# Patient Record
Sex: Female | Born: 1975 | Race: White | Hispanic: No | Marital: Single | Smoking: Never smoker
Health system: Southern US, Community
[De-identification: ages and names within clinical notes are randomized; demographics above are authoritative.]

## PROBLEM LIST (undated history)

## (undated) DIAGNOSIS — G43909 Migraine, unspecified, not intractable, without status migrainosus: Secondary | ICD-10-CM

## (undated) DIAGNOSIS — M797 Fibromyalgia: Secondary | ICD-10-CM

## (undated) DIAGNOSIS — Z87442 Personal history of urinary calculi: Secondary | ICD-10-CM

## (undated) DIAGNOSIS — C801 Malignant (primary) neoplasm, unspecified: Secondary | ICD-10-CM

## (undated) DIAGNOSIS — F419 Anxiety disorder, unspecified: Secondary | ICD-10-CM

## (undated) DIAGNOSIS — E119 Type 2 diabetes mellitus without complications: Secondary | ICD-10-CM

## (undated) DIAGNOSIS — J45909 Unspecified asthma, uncomplicated: Secondary | ICD-10-CM

## (undated) DIAGNOSIS — R160 Hepatomegaly, not elsewhere classified: Secondary | ICD-10-CM

## (undated) DIAGNOSIS — I10 Essential (primary) hypertension: Secondary | ICD-10-CM

## (undated) DIAGNOSIS — E7800 Pure hypercholesterolemia, unspecified: Secondary | ICD-10-CM

## (undated) DIAGNOSIS — F32A Depression, unspecified: Secondary | ICD-10-CM

## (undated) HISTORY — PX: OTHER SURGICAL HISTORY: SHX169

## (undated) HISTORY — PX: SPLENECTOMY: SUR1306

## (undated) HISTORY — PX: LITHOTRIPSY: SUR834

## (undated) HISTORY — PX: NASAL POLYP SURGERY: SHX186

## (undated) HISTORY — PX: WRIST SURGERY: SHX841

## (undated) HISTORY — PX: LEFT OOPHORECTOMY: SHX1961

## (undated) HISTORY — PX: ABDOMINAL HYSTERECTOMY: SHX81

## (undated) HISTORY — PX: ANTERIOR AND POSTERIOR REPAIR: SHX1172

---

## 2020-11-05 ENCOUNTER — Other Ambulatory Visit: Payer: Self-pay

## 2020-11-05 ENCOUNTER — Emergency Department (HOSPITAL_COMMUNITY)
Admission: EM | Admit: 2020-11-05 | Discharge: 2020-11-05 | Disposition: A | Discharge status: Home / Self Care | Payer: Self-pay | Attending: Emergency Medicine | Admitting: Emergency Medicine

## 2020-11-05 ENCOUNTER — Encounter (HOSPITAL_COMMUNITY): Payer: Self-pay

## 2020-11-05 ENCOUNTER — Emergency Department (HOSPITAL_COMMUNITY): Payer: Self-pay

## 2020-11-05 DIAGNOSIS — Z859 Personal history of malignant neoplasm, unspecified: Secondary | ICD-10-CM | POA: Insufficient documentation

## 2020-11-05 DIAGNOSIS — R1011 Right upper quadrant pain: Secondary | ICD-10-CM | POA: Insufficient documentation

## 2020-11-05 DIAGNOSIS — I10 Essential (primary) hypertension: Secondary | ICD-10-CM | POA: Insufficient documentation

## 2020-11-05 DIAGNOSIS — Z79899 Other long term (current) drug therapy: Secondary | ICD-10-CM | POA: Insufficient documentation

## 2020-11-05 DIAGNOSIS — G43809 Other migraine, not intractable, without status migrainosus: Secondary | ICD-10-CM | POA: Insufficient documentation

## 2020-11-05 DIAGNOSIS — E119 Type 2 diabetes mellitus without complications: Secondary | ICD-10-CM | POA: Insufficient documentation

## 2020-11-05 HISTORY — DX: Type 2 diabetes mellitus without complications: E11.9

## 2020-11-05 HISTORY — DX: Depression, unspecified: F32.A

## 2020-11-05 HISTORY — DX: Migraine, unspecified, not intractable, without status migrainosus: G43.909

## 2020-11-05 HISTORY — DX: Pure hypercholesterolemia, unspecified: E78.00

## 2020-11-05 HISTORY — DX: Essential (primary) hypertension: I10

## 2020-11-05 HISTORY — DX: Anxiety disorder, unspecified: F41.9

## 2020-11-05 HISTORY — DX: Malignant (primary) neoplasm, unspecified: C80.1

## 2020-11-05 LAB — URINALYSIS, ROUTINE W REFLEX MICROSCOPIC
Bacteria, UA: NONE SEEN
Bilirubin Urine: NEGATIVE
Glucose, UA: 150 mg/dL — AB
Ketones, ur: 5 mg/dL — AB
Leukocytes,Ua: NEGATIVE
Nitrite: NEGATIVE
Protein, ur: 30 mg/dL — AB
RBC / HPF: >50 RBC/hpf — ABNORMAL HIGH (ref 0–5)
Specific Gravity, Urine: >1.046 — ABNORMAL HIGH (ref 1.005–1.030)
pH: 8 (ref 5.0–8.0)

## 2020-11-05 LAB — COMPREHENSIVE METABOLIC PANEL
ALT: 15 U/L (ref 0–44)
AST: 16 U/L (ref 15–41)
Albumin: 4 g/dL (ref 3.5–5.0)
Alkaline Phosphatase: 77 U/L (ref 38–126)
Anion gap: 9 (ref 5–15)
BUN: 11 mg/dL (ref 6–20)
CO2: 29 mmol/L (ref 22–32)
Calcium: 9.7 mg/dL (ref 8.9–10.3)
Chloride: 104 mmol/L (ref 98–111)
Creatinine, Ser: 0.66 mg/dL (ref 0.44–1.00)
GFR, Estimated: >60 mL/min (ref >60)
Glucose, Bld: 282 mg/dL — ABNORMAL HIGH (ref 70–99)
Potassium: 3.9 mmol/L (ref 3.5–5.1)
Sodium: 142 mmol/L (ref 135–145)
Total Bilirubin: 0.5 mg/dL (ref 0.3–1.2)
Total Protein: 8 g/dL (ref 6.5–8.1)

## 2020-11-05 LAB — CBC
HCT: 41.6 % (ref 36.0–46.0)
Hemoglobin: 13.9 g/dL (ref 12.0–15.0)
MCH: 31.3 pg (ref 26.0–34.0)
MCHC: 33.4 g/dL (ref 30.0–36.0)
MCV: 93.7 fL (ref 80.0–100.0)
Platelets: 549 10*3/uL — ABNORMAL HIGH (ref 150–400)
RBC: 4.44 MIL/uL (ref 3.87–5.11)
RDW: 14.6 % (ref 11.5–15.5)
WBC: 15.2 10*3/uL — ABNORMAL HIGH (ref 4.0–10.5)
nRBC: 0 % (ref 0.0–0.2)

## 2020-11-05 LAB — RAPID URINE DRUG SCREEN, HOSP PERFORMED
Amphetamines: NOT DETECTED
Barbiturates: NOT DETECTED
Benzodiazepines: NOT DETECTED
Cocaine: NOT DETECTED
Opiates: POSITIVE — AB
Tetrahydrocannabinol: POSITIVE — AB

## 2020-11-05 LAB — TYPE AND SCREEN
ABO/RH(D): AB POS
Antibody Screen: NEGATIVE

## 2020-11-05 LAB — POC OCCULT BLOOD, ED: Fecal Occult Bld: NEGATIVE

## 2020-11-05 LAB — CBG MONITORING, ED: Glucose-Capillary: 295 mg/dL — ABNORMAL HIGH (ref 70–99)

## 2020-11-05 LAB — LIPASE, BLOOD: Lipase: 24 U/L (ref 11–51)

## 2020-11-05 MED ORDER — ONDANSETRON HCL 4 MG/2ML IJ SOLN
4.0000 mg | Freq: Once | INTRAMUSCULAR | Status: AC
  Administered 2020-11-05: 4 mg via INTRAVENOUS
  Filled 2020-11-05: qty 2

## 2020-11-05 MED ORDER — SODIUM CHLORIDE 0.9 % IV SOLN
25.0000 mg | INTRAVENOUS | Status: DC | PRN
  Administered 2020-11-05: 25 mg via INTRAVENOUS
  Filled 2020-11-05: qty 25

## 2020-11-05 MED ORDER — HYDROMORPHONE HCL 1 MG/ML IJ SOLN
1.0000 mg | Freq: Once | INTRAMUSCULAR | Status: AC
  Administered 2020-11-05: 1 mg via INTRAVENOUS
  Filled 2020-11-05: qty 1

## 2020-11-05 MED ORDER — DIPHENHYDRAMINE HCL 50 MG/ML IJ SOLN
25.0000 mg | Freq: Once | INTRAMUSCULAR | Status: AC
  Administered 2020-11-05: 25 mg via INTRAVENOUS
  Filled 2020-11-05: qty 1

## 2020-11-05 MED ORDER — KETOROLAC TROMETHAMINE 15 MG/ML IJ SOLN
15.0000 mg | Freq: Once | INTRAMUSCULAR | Status: AC
  Administered 2020-11-05: 15 mg via INTRAVENOUS
  Filled 2020-11-05: qty 1

## 2020-11-05 MED ORDER — SODIUM CHLORIDE 0.9 % IV BOLUS
1000.0000 mL | Freq: Once | INTRAVENOUS | Status: AC
  Administered 2020-11-05: 1000 mL via INTRAVENOUS

## 2020-11-05 MED ORDER — MORPHINE SULFATE (PF) 4 MG/ML IV SOLN
4.0000 mg | Freq: Once | INTRAVENOUS | Status: AC
Start: 2020-11-05 — End: 2020-11-05
  Administered 2020-11-05: 4 mg via INTRAVENOUS
  Filled 2020-11-05: qty 1

## 2020-11-05 MED ORDER — IOHEXOL 350 MG/ML SOLN
80.0000 mL | Freq: Once | INTRAVENOUS | Status: AC | PRN
  Administered 2020-11-05: 80 mL via INTRAVENOUS

## 2020-11-05 MED ORDER — PROCHLORPERAZINE EDISYLATE 10 MG/2ML IJ SOLN
10.0000 mg | Freq: Once | INTRAMUSCULAR | Status: AC
  Administered 2020-11-05: 10 mg via INTRAVENOUS
  Filled 2020-11-05: qty 2

## 2020-11-05 NOTE — ED Notes (Addendum)
Pt reports that she feels dizzy and pain is 9/10 in her head. MD notified.

## 2020-11-05 NOTE — ED Triage Notes (Signed)
Patient c/o migraine and vomiting x 3 days. Patient states she has taken prescribed migraine meds with no relief.  Patient c/o RUQ pain x 3 days. Patient also reports that she saw dark blood in her stool x 3 weeks. Patient reports a colon cancer history

## 2020-11-05 NOTE — Discharge Instructions (Addendum)
Your laboratory results on today's visit were within normal limits.  We discussed the results of your CT abdomen along with your ultrasound findings.  Will need to follow-up with your primary care physician as needed.  If you experience any worsening symptoms, nausea or vomiting you will need to return to the emergency department.

## 2020-11-05 NOTE — ED Provider Notes (Signed)
Emergency Medicine Provider Triage Evaluation Note  Jamie Conley , a 45 y.o. female  was evaluated in triage.  With multiple complaints. Complaining of migraine headaches for the past 3 days. She also complains of RUQ abdominal pain for the past week and BRBPR. Has been taking Topamax for her headache without relief. Feels like typical migraine but worse because she hasn't gotten it to go away. + blurry vision, nausea, vomiting, photophobia, photophobia.   Hx of colon cancer in her 1's - polyps removed and 6 months of chemo. NO colon resection. Still has gallbladder.   Review of Systems  Positive: + HA, nausea, vomiting, abdominal pain, BRBPR Negative: - weakness, numbness, diarrhea  Physical Exam  BP (!) 179/123 (BP Location: Left Arm)   Pulse (!) 102   Temp 98 F (36.7 C) (Oral)   Resp 20   Ht 5\' 3"  (1.6 m)   Wt 96.2 kg   SpO2 98%   BMI 37.55 kg/m  Gen:   Awake, no distress   Resp:  Normal effort  MSK:   Moves extremities without difficulty  Other:  + RUQ TTP with positive Murphy's. Neuro intact.   Medical Decision Making  Medically screening exam initiated at 3:24 PM.  Appropriate orders placed.  Jamie Conley was informed that the remainder of the evaluation will be completed by another provider, this initial triage assessment does not replace that evaluation, and the importance of remaining in the ED until their evaluation is complete.     Jamie Maize, PA-C 11/05/20 1529    Jamie Leigh, MD 11/08/20 [phone]

## 2020-11-05 NOTE — ED Provider Notes (Signed)
[name]   CSN: [id number] Arrival date & time: 11/05/20  1442     History Chief Complaint  Patient presents with   Migraine   Emesis   Hyperglycemia    Jamie Conley is a 45 y.o. female.  45 y.o female with a PMH of Anxiety, Cancer, DM presents to the ED with multiple complaints.  Patient reports she is had a migraine for the past 3 days, does have a history of these, describing as a sharp sensation throughout the whole top of her head.  Exacerbated with sound along with lights.  She is currently on Topamax for prophylactic treatment, has been taking this without any improvement in her symptoms.  The second complaint includes abdominal pain, does describe a severe constant sharp pain to the right upper quadrant that is been ongoing, has tried taking some ibuprofen, Lortab without any improvement.  Does report a prior history of colon cancer at the age of 51 where she had polyps removed along with received 6 months of chemotherapy.  In addition, she is complaining of blood in her stool, has seen multiple small clots of bright red blood on the tissue, along with the toilet bowl.  She is concerned as she has not been able to eat anything in the past 2 days, has had multiple episodes of nonbloody emesis.  Patient does report feeling weaker and dizzy, states that she tried to ambulate from the couch to her bed and felt "I could barely make it to".  Denies to endorse some nausea.  She does have a history of diabetes, currently on 8000 mg regimen twice daily, this was adjusted about 2 weeks ago.  Denies any chest pain, shortness of breath, hematemesis.    The history is provided by the patient and medical records.  Migraine This is a recurrent problem. The current episode started more than 2 days ago. The problem occurs constantly. The problem has been gradually worsening. Associated symptoms include abdominal pain and headaches.  Pertinent negatives include no chest pain and no shortness of breath.  Emesis Associated symptoms: abdominal pain, chills and headaches   Associated symptoms: no fever and no sore throat   Hyperglycemia Associated symptoms: abdominal pain and vomiting   Associated symptoms: no chest pain, no fever, no nausea and no shortness of breath       Past Medical History:  Diagnosis Date   Anxiety    Cancer ([place])    Depression    Diabetes mellitus without complication ([place])    High cholesterol    Hypertension    Migraine     There are no problems to display for this patient.   Past Surgical History:  Procedure Laterality Date   ABDOMINAL HYSTERECTOMY     colon polyectomy     NASAL POLYP SURGERY     SPLENECTOMY     WRIST SURGERY Left      OB History   No obstetric history on file.     Family History  Problem Relation Age of Onset   Hypertension Mother    High Cholesterol Mother    Heart attack Father     Social History   Tobacco Use   Smoking status: Never   Smokeless tobacco: Never  Vaping Use   Vaping Use: Never used  Substance Use Topics   Alcohol use: Never   Drug use: Yes    Comment: Delta 8 gummies    Home Medications Prior to Admission medications  Not on File    Allergies    Patient has no known allergies.  Review of Systems   Review of Systems  Constitutional:  Positive for chills. Negative for fever.  HENT:  Negative for sore throat.   Respiratory:  Negative for shortness of breath.   Cardiovascular:  Negative for chest pain.  Gastrointestinal:  Positive for abdominal pain, blood in stool and vomiting. Negative for nausea.  Genitourinary:  Negative for flank pain.  Neurological:  Positive for headaches.  All other systems reviewed and are negative.  Physical Exam Updated Vital Signs BP (!) 130/96   Pulse 81   Temp 98 F (36.7 C) (Oral)   Resp 15   Ht 5\' 3"  (1.6 m)   Wt 96.2 kg   SpO2 96%   BMI 37.55 kg/m   Physical Exam Vitals  and nursing note reviewed.  Constitutional:      Appearance: Normal appearance. She is ill-appearing.  HENT:     Head: Normocephalic and atraumatic.     Mouth/Throat:     Mouth: Mucous membranes are moist.  Eyes:     Pupils: Pupils are equal, round, and reactive to light.  Cardiovascular:     Rate and Rhythm: Normal rate.     Comments: No bilateral pitting edema, no calf tenderness. Pulmonary:     Effort: Pulmonary effort is normal.     Breath sounds: No wheezing.     Comments: Lungs are clear to auscultation without any wheezing, rhonchi, or rales. Abdominal:     General: Abdomen is flat. Bowel sounds are decreased.     Palpations: Abdomen is soft.     Tenderness: There is abdominal tenderness. There is guarding. There is no right CVA tenderness or left CVA tenderness.     Comments: Significant pain with palpation along the right upper and lower quadrant.  Positive Murphy sign.  Bowel sounds are decreased.  Musculoskeletal:     Cervical back: Normal range of motion and neck supple.  Skin:    General: Skin is warm and dry.  Neurological:     Mental Status: She is alert and oriented to person, place, and time.     Comments: No facial asymmetry, pupils are equal and reactive, no dysarthria.  Moves all upper and lower extremities.    ED Results / Procedures / Treatments   Labs (all labs ordered are listed, but only abnormal results are displayed) Labs Reviewed  COMPREHENSIVE METABOLIC PANEL - Abnormal; Notable for the following components:      Result Value   Glucose, Bld 282 (*)    All other components within normal limits  CBC - Abnormal; Notable for the following components:   WBC 15.2 (*)    Platelets 549 (*)    All other components within normal limits  URINALYSIS, ROUTINE W REFLEX MICROSCOPIC - Abnormal; Notable for the following components:   Color, Urine RED (*)    APPearance HAZY (*)    Specific Gravity, Urine >1.046 (*)    Glucose, UA 150 (*)    Hgb urine  dipstick LARGE (*)    Ketones, ur 5 (*)    Protein, ur 30 (*)    RBC / HPF >50 (*)    All other components within normal limits  RAPID URINE DRUG SCREEN, HOSP PERFORMED - Abnormal; Notable for the following components:   Opiates POSITIVE (*)    Tetrahydrocannabinol POSITIVE (*)    All other components within normal limits  CBG MONITORING, ED - Abnormal; Notable  for the following components:   Glucose-Capillary 295 (*)    All other components within normal limits  LIPASE, BLOOD  POC OCCULT BLOOD, ED  TYPE AND SCREEN  ABO/RH    EKG None  Radiology CT ABDOMEN PELVIS W CONTRAST  Result Date: 11/05/2020 CLINICAL DATA:  Right upper quadrant pain for 3 days. Blood in stool 3 weeks ago. History of colon cancer. EXAM: CT ABDOMEN AND PELVIS WITH CONTRAST TECHNIQUE: Multidetector CT imaging of the abdomen and pelvis was performed using the standard protocol following bolus administration of intravenous contrast. CONTRAST:  60mL OMNIPAQUE IOHEXOL 350 MG/ML SOLN COMPARISON:  Abdominal ultrasound of earlier today FINDINGS: Lower chest: Right lower lobe pleural-based nodule or area of pleural thickening measuring 6 mm on 19/6. Normal heart size without pericardial or pleural effusion. Right coronary artery calcification. Hepatobiliary: Moderate hepatic steatosis and hepatomegaly. Caudate and lateral segment left liver lobe enlargement. Mildly irregular hepatic capsule. Normal gallbladder. No biliary duct dilatation. Pancreas: Normal, without mass or ductal dilatation. Spleen: Splenectomy. Adrenals/Urinary Tract: Normal adrenal glands. Lower pole left renal collecting system calculi of up to 5 mm including on 41/2. No hydronephrosis. Normal urinary bladder. Stomach/Bowel: Normal stomach, without wall thickening. Normal colon and terminal ileum. Retrocecal appendix terminates in the right upper quadrant, but is otherwise normal. Normal small bowel. Vascular/Lymphatic: Aortic atherosclerosis. No portal venous  hypertension. No abdominal or pelvic sidewall adenopathy. Multiple bilateral inguinal nodes are likely reactive and can be seen in the setting of obesity. Reproductive: Hysterectomy.  No adnexal mass. Other: No significant free fluid. No free intraperitoneal air. Ventral abdominal wall laxity contains fat, including on 51/2. Musculoskeletal: No acute osseous abnormality. IMPRESSION: 1.  No acute process or explanation for right upper quadrant pain. 2. Hepatomegaly and hepatic steatosis.  Suspicion of mild cirrhosis. 3. Markedly age advanced coronary artery atherosclerosis. Correlate with risk factors and consider medical therapy. 4. Pleural-based right lower lobe nodule or area of pleural thickening. Given the clinical history of colon cancer, consider chest CT follow-up at 6 months. Unlikely to represent metastatic disease given location and morphology. 5. Left nephrolithiasis Electronically Signed   By: Abigail Miyamoto M.D.   On: 11/05/2020 19:00   US Abdomen Limited RUQ (LIVER/GB)  Result Date: 11/05/2020 CLINICAL DATA:  Right upper quadrant pain. EXAM: ULTRASOUND ABDOMEN LIMITED RIGHT UPPER QUADRANT COMPARISON:  None. FINDINGS: Gallbladder: No gallstones or wall thickening visualized. No sonographic Murphy sign noted by sonographer. Common bile duct: Diameter: 7.6 mm. Liver: No focal lesion identified. Parenchymal echogenicity has diffusely increased. Portal vein is patent on color Doppler imaging with normal direction of blood flow towards the liver. IMPRESSION: The gallbladder appears normal. Common bile duct borderline dilated. Recommend correlation with lab values to exclude distal bile duct obstruction. Electronically Signed   By: Ronney Asters M.D.   On: 11/05/2020 16:41    Procedures Procedures   Medications Ordered in ED Medications  diphenhydrAMINE (BENADRYL) 25 mg in sodium chloride 0.9 % 50 mL IVPB (0 mg Intravenous Stopped 11/05/20 1811)  ondansetron (ZOFRAN) injection 4 mg (4 mg Intravenous  Given 11/05/20 1704)  morphine 4 MG/ML injection 4 mg (4 mg Intravenous Given 11/05/20 1704)  sodium chloride 0.9 % bolus 1,000 mL (0 mLs Intravenous Stopped 11/05/20 1742)  iohexol (OMNIPAQUE) 350 MG/ML injection 80 mL (80 mLs Intravenous Contrast Given 11/05/20 1806)  HYDROmorphone (DILAUDID) injection 1 mg (1 mg Intravenous Given 11/05/20 1826)  diphenhydrAMINE (BENADRYL) injection 25 mg (25 mg Intravenous Given 11/05/20 2015)  prochlorperazine (COMPAZINE) injection 10 mg (  10 mg Intravenous Given 11/05/20 2016)  ketorolac (TORADOL) 15 MG/ML injection 15 mg (15 mg Intravenous Given 11/05/20 2014)    ED Course  I have reviewed the triage vital signs and the nursing notes.  Pertinent labs & imaging results that were available during my care of the patient were reviewed by me and considered in my medical decision making (see chart for details).  Clinical Course as of 11/05/20 2134  Thu Nov 05, 2020  1625 Glucose-Capillary(!): 295 [JS]  1923 Fecal Occult Blood, POC: NEGATIVE [JS]  2048 Tetrahydrocannabinol(!): POSITIVE [JS]  2048 Hgb urine dipstick(!): LARGE [JS]  2051 WBC(!): 15.2 [JS]    Clinical Course User Index [JS] Janeece Fitting, PA-C   MDM Rules/Calculators/A&P   Patient presents to the ED with multiple complaints, first complaint being a migraine, prior history of this so prophylactic medication without improvement.  The second complaint is right upper quadrant pain, increasing weakness along with dizziness has been ongoing for the last couple days.  Does have a prior history of colon cancer, approximately at the age of 42, is concerned as there was multiple clots of blood in her stool, along with the toilet bowl.  Does report her blood sugars have been running slightly elevated, had her regimen recently changed approximately 2 weeks ago.  She is currently on 1000 mg of metformin twice daily.  Vitals are remarkable on arrival for hypertension, does report taking medication for her blood  pressure, however she feels that this is likely due to her pain on exam positive Murphy sign, does have some tenderness along McBurney's point.  Lungs are clear to auscultation, she is neurologically intact with equal and reactive pupils.  No facial asymmetry or dysarthria.  Does have a history of migraine, provided with headache cocktail.  Given Zofran, morphine, bolus to help with symptomatic treatment.  Ultrasound was ordered in the waiting room.  This revealed: The gallbladder appears normal. Common bile duct borderline dilated.  Recommend correlation with lab values to exclude distal bile duct  obstruction.      CT abdomen on today's visit showed: 1.  No acute process or explanation for right upper quadrant pain.  2. Hepatomegaly and hepatic steatosis.  Suspicion of mild cirrhosis.  3. Markedly age advanced coronary artery atherosclerosis. Correlate  with risk factors and consider medical therapy.  4. Pleural-based right lower lobe nodule or area of pleural  thickening. Given the clinical history of colon cancer, consider  chest CT follow-up at 6 months. Unlikely to represent metastatic  disease given location and morphology.  5. Left nephrolithiasis          She has received multiple rounds of medication while in the ED, including morphine, Dilaudid, headache cocktail for symptomatic treatment.  Patient's vitals have remained stable.  I did discuss results of CT findings with her, continues to voice pain.  However she does not have any nausea or active vomiting at the time.  UA with no signs of infection, UDS positive for opiates along with THC.  9:32 PM patient reassessed by me, with improvement in her symptoms.  We discussed following up with appropriate PCP, I am unable to see patient's records from prior visits as she reports she moved here from [place], however is currently residing in a [place] address. No records located on [facility].   PDMP was reviewed with some  narcotics filled in the state of [place], requested pain medication repeatedly however workup has been negative thus far. Patient understands and agrees  with management.      Portions of this note were generated with [employer]. Dictation errors may occur despite best attempts at proofreading.  Final Clinical Impression(s) / ED Diagnoses Final diagnoses:  RUQ abdominal pain  Other migraine without status migrainosus, not intractable    Rx / DC Orders ED Discharge Orders     None        Janeece Fitting, Hershal Coria 11/05/20 2134    Drenda Freeze, MD 11/05/20 2239

## 2020-12-23 ENCOUNTER — Encounter (HOSPITAL_BASED_OUTPATIENT_CLINIC_OR_DEPARTMENT_OTHER): Payer: Self-pay | Admitting: Emergency Medicine

## 2020-12-23 ENCOUNTER — Other Ambulatory Visit: Payer: Self-pay

## 2020-12-23 ENCOUNTER — Emergency Department (HOSPITAL_BASED_OUTPATIENT_CLINIC_OR_DEPARTMENT_OTHER): Payer: Self-pay

## 2020-12-23 ENCOUNTER — Emergency Department (HOSPITAL_BASED_OUTPATIENT_CLINIC_OR_DEPARTMENT_OTHER)
Admission: EM | Admit: 2020-12-23 | Discharge: 2020-12-24 | Disposition: A | Discharge status: Home / Self Care | Payer: Self-pay | Attending: Emergency Medicine | Admitting: Emergency Medicine

## 2020-12-23 DIAGNOSIS — Z859 Personal history of malignant neoplasm, unspecified: Secondary | ICD-10-CM | POA: Insufficient documentation

## 2020-12-23 DIAGNOSIS — R350 Frequency of micturition: Secondary | ICD-10-CM | POA: Insufficient documentation

## 2020-12-23 DIAGNOSIS — R112 Nausea with vomiting, unspecified: Secondary | ICD-10-CM | POA: Insufficient documentation

## 2020-12-23 DIAGNOSIS — R197 Diarrhea, unspecified: Secondary | ICD-10-CM | POA: Insufficient documentation

## 2020-12-23 DIAGNOSIS — E119 Type 2 diabetes mellitus without complications: Secondary | ICD-10-CM | POA: Insufficient documentation

## 2020-12-23 DIAGNOSIS — R319 Hematuria, unspecified: Secondary | ICD-10-CM | POA: Insufficient documentation

## 2020-12-23 DIAGNOSIS — R300 Dysuria: Secondary | ICD-10-CM | POA: Insufficient documentation

## 2020-12-23 DIAGNOSIS — R519 Headache, unspecified: Secondary | ICD-10-CM

## 2020-12-23 DIAGNOSIS — F419 Anxiety disorder, unspecified: Secondary | ICD-10-CM | POA: Insufficient documentation

## 2020-12-23 DIAGNOSIS — G43909 Migraine, unspecified, not intractable, without status migrainosus: Secondary | ICD-10-CM | POA: Insufficient documentation

## 2020-12-23 DIAGNOSIS — I10 Essential (primary) hypertension: Secondary | ICD-10-CM | POA: Insufficient documentation

## 2020-12-23 LAB — CBC WITH DIFFERENTIAL/PLATELET
Abs Immature Granulocytes: 0.05 10*3/uL (ref 0.00–0.07)
Basophils Absolute: 0.1 10*3/uL (ref 0.0–0.1)
Basophils Relative: 0 %
Eosinophils Absolute: 0.1 10*3/uL (ref 0.0–0.5)
Eosinophils Relative: 1 %
HCT: 41 % (ref 36.0–46.0)
Hemoglobin: 14.2 g/dL (ref 12.0–15.0)
Immature Granulocytes: 0 %
Lymphocytes Relative: 41 %
Lymphs Abs: 6.7 10*3/uL — ABNORMAL HIGH (ref 0.7–4.0)
MCH: 31.1 pg (ref 26.0–34.0)
MCHC: 34.6 g/dL (ref 30.0–36.0)
MCV: 89.9 fL (ref 80.0–100.0)
Monocytes Absolute: 0.9 10*3/uL (ref 0.1–1.0)
Monocytes Relative: 6 %
Neutro Abs: 8.3 10*3/uL — ABNORMAL HIGH (ref 1.7–7.7)
Neutrophils Relative %: 52 %
Platelets: 547 10*3/uL — ABNORMAL HIGH (ref 150–400)
RBC: 4.56 MIL/uL (ref 3.87–5.11)
RDW: 14 % (ref 11.5–15.5)
WBC: 16.2 10*3/uL — ABNORMAL HIGH (ref 4.0–10.5)
nRBC: 0 % (ref 0.0–0.2)

## 2020-12-23 LAB — URINALYSIS, ROUTINE W REFLEX MICROSCOPIC: Specific Gravity, Urine: <1.005 — ABNORMAL LOW (ref 1.005–1.030)

## 2020-12-23 LAB — BASIC METABOLIC PANEL
Anion gap: 13 (ref 5–15)
BUN: 16 mg/dL (ref 6–20)
CO2: 19 mmol/L — ABNORMAL LOW (ref 22–32)
Calcium: 9.9 mg/dL (ref 8.9–10.3)
Chloride: 106 mmol/L (ref 98–111)
Creatinine, Ser: 0.75 mg/dL (ref 0.44–1.00)
GFR, Estimated: >60 mL/min (ref >60)
Glucose, Bld: 249 mg/dL — ABNORMAL HIGH (ref 70–99)
Potassium: 3.6 mmol/L (ref 3.5–5.1)
Sodium: 138 mmol/L (ref 135–145)

## 2020-12-23 LAB — WET PREP, GENITAL
Clue Cells Wet Prep HPF POC: NONE SEEN
Sperm: NONE SEEN
Trich, Wet Prep: NONE SEEN
WBC, Wet Prep HPF POC: <10 (ref <10)
Yeast Wet Prep HPF POC: NONE SEEN

## 2020-12-23 LAB — URINALYSIS, MICROSCOPIC (REFLEX): RBC / HPF: >50 RBC/hpf (ref 0–5)

## 2020-12-23 LAB — CBG MONITORING, ED: Glucose-Capillary: 238 mg/dL — ABNORMAL HIGH (ref 70–99)

## 2020-12-23 MED ORDER — DIPHENHYDRAMINE HCL 50 MG/ML IJ SOLN
25.0000 mg | Freq: Once | INTRAMUSCULAR | Status: AC
  Administered 2020-12-23: 25 mg via INTRAVENOUS
  Filled 2020-12-23: qty 1

## 2020-12-23 MED ORDER — KETOROLAC TROMETHAMINE 30 MG/ML IJ SOLN
30.0000 mg | Freq: Once | INTRAMUSCULAR | Status: AC
  Administered 2020-12-23: 30 mg via INTRAVENOUS
  Filled 2020-12-23: qty 1

## 2020-12-23 MED ORDER — SODIUM CHLORIDE 0.9 % IV BOLUS
1000.0000 mL | Freq: Once | INTRAVENOUS | Status: AC
Start: 2020-12-23 — End: 2020-12-23
  Administered 2020-12-23: 1000 mL via INTRAVENOUS

## 2020-12-23 MED ORDER — IOHEXOL 350 MG/ML SOLN
80.0000 mL | Freq: Once | INTRAVENOUS | Status: AC | PRN
  Administered 2020-12-23: 100 mL via INTRAVENOUS

## 2020-12-23 MED ORDER — PROCHLORPERAZINE EDISYLATE 10 MG/2ML IJ SOLN
10.0000 mg | Freq: Once | INTRAMUSCULAR | Status: AC
  Administered 2020-12-23: 10 mg via INTRAVENOUS
  Filled 2020-12-23: qty 2

## 2020-12-23 MED ORDER — INSULIN ASPART 100 UNIT/ML IJ SOLN
8.0000 [IU] | Freq: Once | INTRAMUSCULAR | Status: AC
  Administered 2020-12-23: 8 [IU] via SUBCUTANEOUS

## 2020-12-23 NOTE — Discharge Instructions (Signed)
CT scan results:  1. Hepatic steatosis.  2. Stable 6 mm pleural based right lower lobe lung nodule along the  posterolateral aspect of the right lower lobe. Non-contrast chest CT  at 6-12 months is recommended. If the nodule is stable at time of  repeat CT, then future CT at 18-24 months (from today's scan) is  considered optional for low-risk patients, but is recommended for  high-risk patients. This recommendation follows the consensus  statement: Guidelines for Management of Incidental Pulmonary Nodules  Detected on CT Images: From the Fleischner Society 2017; Radiology  2017; 284:228-243.

## 2020-12-23 NOTE — ED Provider Notes (Signed)
[facility] EMERGENCY DEPT Provider Note   CSN: [id number] Arrival date & time: 12/23/20  1855     History Chief Complaint  Patient presents with   Migraine    Melika Reder is a 45 y.o. female.  Mikiya Deauna Yaw is a 45 y.o. female with a history of hypertension, hyperlipidemia, diabetes, migraines, colon cancer, anxiety and depression, who presents to the emergency department with multiple complaints.  Patient reports she primarily came because she has been having a persistent and worsening migraine.  Started today and she has taken multiple doses of her migraine medication without resolution.  She reports with this she has had nausea and vomiting which is typical with her migraines.  She reports light sensitivity but denies any vision changes.  No numbness, tingling or weakness in her extremities.  Also reports that today she started to have severe pain across her mid back, she denies injury or trauma does report she has had some back pain previously and has an underlying history of degenerative disc disease.  No loss of bowel or bladder control or saddle anesthesia.  She also has been having dysuria, urinary frequency and hematuria for the past 2 to 3 days and is also concerned she may have a yeast infection.  Reports she has had to use Monistat previously.  Questions whether back pain could be related to a urinary tract infection.  Back pain is bilateral.  No fevers or chills.  She also reports that she had to leave her home in [place] and left without her diabetes medication and is concerned her blood sugar may be high now.  No associated fevers or chills.  Patient also endorses anxiety and that she is without her anxiety medications.  No other aggravating or alleviating factors.  The history is provided by the patient and medical records.  Migraine Associated symptoms include headaches. Pertinent negatives include no chest pain, no abdominal pain and  no shortness of breath.      Past Medical History:  Diagnosis Date   Anxiety    Cancer ([place])    Depression    Diabetes mellitus without complication ([place])    High cholesterol    Hypertension    Migraine     There are no problems to display for this patient.   Past Surgical History:  Procedure Laterality Date   ABDOMINAL HYSTERECTOMY     colon polyectomy     NASAL POLYP SURGERY     SPLENECTOMY     WRIST SURGERY Left      OB History   No obstetric history on file.     Family History  Problem Relation Age of Onset   Hypertension Mother    High Cholesterol Mother    Heart attack Father     Social History   Tobacco Use   Smoking status: Never   Smokeless tobacco: Never  Vaping Use   Vaping Use: Never used  Substance Use Topics   Alcohol use: Never   Drug use: Yes    Comment: Delta 8 gummies    Home Medications Prior to Admission medications   Not on File    Allergies    Patient has no known allergies.  Review of Systems   Review of Systems  Constitutional:  Negative for chills and fever.  HENT: Negative.    Eyes:  Positive for photophobia. Negative for visual disturbance.  Respiratory:  Negative for cough and shortness of breath.   Cardiovascular:  Negative for chest pain.  Gastrointestinal:  Positive for nausea and vomiting. Negative for abdominal pain, constipation and diarrhea.  Genitourinary:  Positive for dysuria, frequency and hematuria. Negative for vaginal bleeding and vaginal discharge.  Musculoskeletal:  Positive for back pain. Negative for myalgias, neck pain and neck stiffness.  Skin:  Negative for color change and rash.  Neurological:  Positive for headaches. Negative for dizziness, seizures, syncope, facial asymmetry, speech difficulty, weakness, light-headedness and numbness.  All other systems reviewed and are negative.  Physical Exam Updated Vital Signs BP (!) 134/97   Pulse 100   Temp 98.8 F (37.1 C) (Oral)   Resp 16   Ht  5\' 3"  (1.6 m)   Wt 93.9 kg   SpO2 98%   BMI 36.67 kg/m   Physical Exam Vitals and nursing note reviewed.  Constitutional:      General: She is not in acute distress.    Appearance: Normal appearance. She is well-developed. She is not ill-appearing or diaphoretic.     Comments: Patient appears anxious but is well-appearing and in no acute distress.  HENT:     Head: Normocephalic and atraumatic.     Mouth/Throat:     Mouth: Mucous membranes are moist.     Pharynx: Oropharynx is clear.  Eyes:     General:        Right eye: No discharge.        Left eye: No discharge.     Extraocular Movements: Extraocular movements intact.     Pupils: Pupils are equal, round, and reactive to light.  Cardiovascular:     Rate and Rhythm: Normal rate and regular rhythm.     Pulses: Normal pulses.     Heart sounds: Normal heart sounds. No murmur heard.   No friction rub. No gallop.  Pulmonary:     Effort: Pulmonary effort is normal. No respiratory distress.     Breath sounds: Normal breath sounds. No wheezing or rales.     Comments: Respirations equal and unlabored, patient able to speak in full sentences, lungs clear to auscultation bilaterally  Abdominal:     General: Bowel sounds are normal. There is no distension.     Palpations: Abdomen is soft. There is no mass.     Tenderness: There is no abdominal tenderness. There is no right CVA tenderness, left CVA tenderness or guarding.     Comments: Abdomen soft, nondistended, nontender to palpation in all quadrants without guarding or peritoneal signs  Musculoskeletal:        General: No deformity.     Cervical back: Neck supple.     Right lower leg: No edema.     Left lower leg: No edema.     Comments: Tenderness across the lower thoracic back without midline tenderness, step-off or deformity  Skin:    General: Skin is warm and dry.     Capillary Refill: Capillary refill takes less than 2 seconds.  Neurological:     Mental Status: She is alert  and oriented to person, place, and time.     Coordination: Coordination normal.     Comments: Speech is clear, able to follow commands CN III-XII intact Normal strength in upper and lower extremities bilaterally including dorsiflexion and plantar flexion, strong and equal grip strength Sensation normal to light and sharp touch Moves extremities without ataxia, coordination intact  Psychiatric:        Mood and Affect: Mood is anxious.        Behavior: Behavior normal.    ED  Results / Procedures / Treatments   Labs (all labs ordered are listed, but only abnormal results are displayed) Labs Reviewed  CBC WITH DIFFERENTIAL/PLATELET - Abnormal; Notable for the following components:      Result Value   WBC 16.2 (*)    Platelets 547 (*)    Neutro Abs 8.3 (*)    Lymphs Abs 6.7 (*)    All other components within normal limits  CBG MONITORING, ED - Abnormal; Notable for the following components:   Glucose-Capillary 238 (*)    All other components within normal limits  WET PREP, GENITAL  URINE CULTURE  URINALYSIS, ROUTINE W REFLEX MICROSCOPIC  BASIC METABOLIC PANEL    EKG None  Radiology No results found.  Procedures Procedures   Medications Ordered in ED Medications  sodium chloride 0.9 % bolus 1,000 mL (1,000 mLs Intravenous New Bag/Given 12/23/20 2134)  ketorolac (TORADOL) 30 MG/ML injection 30 mg (30 mg Intravenous Given 12/23/20 2136)  diphenhydrAMINE (BENADRYL) injection 25 mg (25 mg Intravenous Given 12/23/20 2135)  prochlorperazine (COMPAZINE) injection 10 mg (10 mg Intravenous Given 12/23/20 2136)    ED Course  I have reviewed the triage vital signs and the nursing notes.  Pertinent labs & imaging results that were available during my care of the patient were reviewed by me and considered in my medical decision making (see chart for details).    MDM Rules/Calculators/A&P                           45 year old female arrives with multiple complaints, reports  migraine not responding to her home medications as well as dysuria, and hematuria, thoracic back pain and nausea vomiting.  She has no focal neurologic deficits on exam, no nuchal rigidity to suggest meningitis, suspect persistent migraine, patient has been seen in the ED previously for similar headaches.  She has hematuria here without focal flank pain does have some pain across her back but no CVA tenderness on exam.  Concern for likely UTI, will check urinalysis and culture.  Patient also reports concern for possible yeast infection which she has had previously, will have patient self swab for wet prep.  She is not having any pelvic pain or reported discharge so we will defer pelvic exam at this time.  Patient also reports she has been without her diabetes medications and is concerned that her blood sugar may be high.  CBG of 238 on arrival, will check basic labs overall patient appears well, I have very low suspicion for DKA.  Patient will be given IV headache cocktail and IV fluids and will reevaluate headache and symptoms.  I have independently ordered, reviewed and interpreted all labs and imaging: CBG: 238 CBC: Leukocytosis of 16.2, normal hemoglobin, no abnormal vitals to suggest sepsis  BMP and urinalysis pending.  At shift change care signed out to Dr. Almyra Free who will follow up on urinalysis and BMP.  Suspect patient will be appropriate for discharge, will likely need p.o. antibiotics for urinary tract infection if her urinalysis is consistent with that.  Patient reports she has been without multiple of her anxiety and psychiatric medications, encourage patient to follow-up with the behavioral health urgent care to get these medications refilled.   Final Clinical Impression(s) / ED Diagnoses Final diagnoses:  Bad headache  Dysuria  Nausea and vomiting, unspecified vomiting type    Rx / DC Orders ED Discharge Orders     None  Janet Berlin 12/23/20 2212    Luna Fuse, MD 12/28/20 1622

## 2020-12-23 NOTE — ED Triage Notes (Signed)
Pt presents to ED POV. Pt c/o migraine. Pt reports she took home rx for migraine w/o relief. Pt also c/o back pain, stomach pain, n/v. Also mentions s/s of UTI and yeast infection.

## 2020-12-24 ENCOUNTER — Emergency Department (HOSPITAL_BASED_OUTPATIENT_CLINIC_OR_DEPARTMENT_OTHER): Payer: Self-pay

## 2020-12-24 ENCOUNTER — Encounter (HOSPITAL_BASED_OUTPATIENT_CLINIC_OR_DEPARTMENT_OTHER): Payer: Self-pay

## 2020-12-24 ENCOUNTER — Inpatient Hospital Stay (HOSPITAL_BASED_OUTPATIENT_CLINIC_OR_DEPARTMENT_OTHER)
Admission: EM | Admit: 2020-12-24 | Discharge: 2021-01-01 | DRG: 103 | Disposition: A | Discharge status: Home / Self Care | Payer: Self-pay | Attending: Internal Medicine | Admitting: Internal Medicine

## 2020-12-24 ENCOUNTER — Other Ambulatory Visit: Payer: Self-pay

## 2020-12-24 DIAGNOSIS — D72829 Elevated white blood cell count, unspecified: Secondary | ICD-10-CM | POA: Diagnosis present

## 2020-12-24 DIAGNOSIS — I10 Essential (primary) hypertension: Secondary | ICD-10-CM | POA: Diagnosis present

## 2020-12-24 DIAGNOSIS — Z8249 Family history of ischemic heart disease and other diseases of the circulatory system: Secondary | ICD-10-CM

## 2020-12-24 DIAGNOSIS — F32A Depression, unspecified: Secondary | ICD-10-CM | POA: Diagnosis present

## 2020-12-24 DIAGNOSIS — Z7984 Long term (current) use of oral hypoglycemic drugs: Secondary | ICD-10-CM

## 2020-12-24 DIAGNOSIS — E1165 Type 2 diabetes mellitus with hyperglycemia: Secondary | ICD-10-CM | POA: Diagnosis present

## 2020-12-24 DIAGNOSIS — Z9081 Acquired absence of spleen: Secondary | ICD-10-CM

## 2020-12-24 DIAGNOSIS — N136 Pyonephrosis: Secondary | ICD-10-CM | POA: Diagnosis present

## 2020-12-24 DIAGNOSIS — G43909 Migraine, unspecified, not intractable, without status migrainosus: Secondary | ICD-10-CM | POA: Diagnosis present

## 2020-12-24 DIAGNOSIS — R519 Headache, unspecified: Secondary | ICD-10-CM | POA: Diagnosis present

## 2020-12-24 DIAGNOSIS — Z20822 Contact with and (suspected) exposure to covid-19: Secondary | ICD-10-CM | POA: Diagnosis present

## 2020-12-24 DIAGNOSIS — K760 Fatty (change of) liver, not elsewhere classified: Secondary | ICD-10-CM | POA: Diagnosis present

## 2020-12-24 DIAGNOSIS — K921 Melena: Secondary | ICD-10-CM | POA: Diagnosis not present

## 2020-12-24 DIAGNOSIS — Z79899 Other long term (current) drug therapy: Secondary | ICD-10-CM

## 2020-12-24 DIAGNOSIS — G039 Meningitis, unspecified: Secondary | ICD-10-CM

## 2020-12-24 DIAGNOSIS — Z6837 Body mass index (BMI) 37.0-37.9, adult: Secondary | ICD-10-CM

## 2020-12-24 DIAGNOSIS — G43901 Migraine, unspecified, not intractable, with status migrainosus: Principal | ICD-10-CM

## 2020-12-24 DIAGNOSIS — R197 Diarrhea, unspecified: Secondary | ICD-10-CM | POA: Diagnosis present

## 2020-12-24 DIAGNOSIS — D75839 Thrombocytosis, unspecified: Secondary | ICD-10-CM | POA: Diagnosis present

## 2020-12-24 DIAGNOSIS — E876 Hypokalemia: Secondary | ICD-10-CM | POA: Diagnosis present

## 2020-12-24 DIAGNOSIS — F0781 Postconcussional syndrome: Secondary | ICD-10-CM

## 2020-12-24 DIAGNOSIS — F419 Anxiety disorder, unspecified: Secondary | ICD-10-CM | POA: Diagnosis present

## 2020-12-24 DIAGNOSIS — E872 Acidosis, unspecified: Secondary | ICD-10-CM | POA: Diagnosis present

## 2020-12-24 DIAGNOSIS — E669 Obesity, unspecified: Secondary | ICD-10-CM | POA: Diagnosis present

## 2020-12-24 DIAGNOSIS — E785 Hyperlipidemia, unspecified: Secondary | ICD-10-CM | POA: Diagnosis present

## 2020-12-24 DIAGNOSIS — R55 Syncope and collapse: Secondary | ICD-10-CM | POA: Diagnosis present

## 2020-12-24 DIAGNOSIS — I700 Atherosclerosis of aorta: Secondary | ICD-10-CM | POA: Diagnosis present

## 2020-12-24 DIAGNOSIS — E7800 Pure hypercholesterolemia, unspecified: Secondary | ICD-10-CM | POA: Diagnosis present

## 2020-12-24 DIAGNOSIS — Z83438 Family history of other disorder of lipoprotein metabolism and other lipidemia: Secondary | ICD-10-CM

## 2020-12-24 DIAGNOSIS — B951 Streptococcus, group B, as the cause of diseases classified elsewhere: Secondary | ICD-10-CM | POA: Diagnosis present

## 2020-12-24 DIAGNOSIS — Z8500 Personal history of malignant neoplasm of unspecified digestive organ: Secondary | ICD-10-CM

## 2020-12-24 LAB — BASIC METABOLIC PANEL
Anion gap: 12 (ref 5–15)
BUN: 15 mg/dL (ref 6–20)
CO2: 23 mmol/L (ref 22–32)
Calcium: 9.7 mg/dL (ref 8.9–10.3)
Chloride: 103 mmol/L (ref 98–111)
Creatinine, Ser: 0.74 mg/dL (ref 0.44–1.00)
GFR, Estimated: >60 mL/min (ref >60)
Glucose, Bld: 269 mg/dL — ABNORMAL HIGH (ref 70–99)
Potassium: 3.7 mmol/L (ref 3.5–5.1)
Sodium: 138 mmol/L (ref 135–145)

## 2020-12-24 LAB — URINALYSIS, ROUTINE W REFLEX MICROSCOPIC
Bilirubin Urine: NEGATIVE
Glucose, UA: >1000 mg/dL — AB
Ketones, ur: NEGATIVE mg/dL
Leukocytes,Ua: NEGATIVE
Nitrite: NEGATIVE
Protein, ur: NEGATIVE mg/dL
RBC / HPF: >50 RBC/hpf — ABNORMAL HIGH (ref 0–5)
Specific Gravity, Urine: 1.036 — ABNORMAL HIGH (ref 1.005–1.030)
pH: 6.5 (ref 5.0–8.0)

## 2020-12-24 LAB — CBC WITH DIFFERENTIAL/PLATELET
Abs Immature Granulocytes: 0.04 10*3/uL (ref 0.00–0.07)
Basophils Absolute: 0.1 10*3/uL (ref 0.0–0.1)
Basophils Relative: 1 %
Eosinophils Absolute: 0.2 10*3/uL (ref 0.0–0.5)
Eosinophils Relative: 1 %
HCT: 42.6 % (ref 36.0–46.0)
Hemoglobin: 14.2 g/dL (ref 12.0–15.0)
Immature Granulocytes: 0 %
Lymphocytes Relative: 42 %
Lymphs Abs: 5.8 10*3/uL — ABNORMAL HIGH (ref 0.7–4.0)
MCH: 30.9 pg (ref 26.0–34.0)
MCHC: 33.3 g/dL (ref 30.0–36.0)
MCV: 92.6 fL (ref 80.0–100.0)
Monocytes Absolute: 0.9 10*3/uL (ref 0.1–1.0)
Monocytes Relative: 7 %
Neutro Abs: 6.9 10*3/uL (ref 1.7–7.7)
Neutrophils Relative %: 49 %
Platelets: 527 10*3/uL — ABNORMAL HIGH (ref 150–400)
RBC: 4.6 MIL/uL (ref 3.87–5.11)
RDW: 14.4 % (ref 11.5–15.5)
WBC: 14 10*3/uL — ABNORMAL HIGH (ref 4.0–10.5)
nRBC: 0 % (ref 0.0–0.2)

## 2020-12-24 LAB — CBG MONITORING, ED: Glucose-Capillary: 206 mg/dL — ABNORMAL HIGH (ref 70–99)

## 2020-12-24 LAB — RESP PANEL BY RT-PCR (FLU A&B, COVID) ARPGX2
Influenza A by PCR: NEGATIVE
Influenza B by PCR: NEGATIVE
SARS Coronavirus 2 by RT PCR: NEGATIVE

## 2020-12-24 LAB — PROTEIN AND GLUCOSE, CSF
Glucose, CSF: 145 mg/dL — ABNORMAL HIGH (ref 40–70)
Total  Protein, CSF: 60 mg/dL — ABNORMAL HIGH (ref 15–45)

## 2020-12-24 MED ORDER — BUTALBITAL-APAP-CAFFEINE 50-325-40 MG PO TABS: 1.0000 | ORAL_TABLET | Freq: Four times a day (QID) | ORAL | 0 refills | Status: DC | PRN

## 2020-12-24 MED ORDER — ASPIRIN-ACETAMINOPHEN-CAFFEINE 250-250-65 MG PO TABS
2.0000 | ORAL_TABLET | Freq: Once | ORAL | Status: AC
  Administered 2020-12-24: 2 via ORAL
  Filled 2020-12-24: qty 2

## 2020-12-24 MED ORDER — KETOROLAC TROMETHAMINE 15 MG/ML IJ SOLN
15.0000 mg | Freq: Once | INTRAMUSCULAR | Status: AC
  Administered 2020-12-24: 20:00:00 15 mg via INTRAVENOUS
  Filled 2020-12-24: qty 1

## 2020-12-24 MED ORDER — BUTALBITAL-APAP-CAFFEINE 50-325-40 MG PO TABS: 2.0000 | ORAL_TABLET | Freq: Once | ORAL | Status: DC

## 2020-12-24 MED ORDER — LACTATED RINGERS IV SOLN: INTRAVENOUS | Status: DC

## 2020-12-24 MED ORDER — SODIUM CHLORIDE 0.9 % IV SOLN
2.0000 g | Freq: Two times a day (BID) | INTRAVENOUS | Status: DC
  Administered 2020-12-24: 23:00:00 2 g via INTRAVENOUS
  Filled 2020-12-24: qty 20

## 2020-12-24 MED ORDER — MAGNESIUM SULFATE 2 GM/50ML IV SOLN
2.0000 g | Freq: Once | INTRAVENOUS | Status: AC
  Administered 2020-12-24: 2 g via INTRAVENOUS
  Filled 2020-12-24: qty 50

## 2020-12-24 MED ORDER — DEXTROSE 5 % IV SOLN
10.0000 mg/kg | Freq: Three times a day (TID) | INTRAVENOUS | Status: DC
  Administered 2020-12-25 (×2): 690 mg via INTRAVENOUS
  Filled 2020-12-24 (×5): qty 13.8

## 2020-12-24 MED ORDER — IOHEXOL 350 MG/ML SOLN
75.0000 mL | Freq: Once | INTRAVENOUS | Status: AC | PRN
  Administered 2020-12-24: 20:00:00 100 mL via INTRAVENOUS

## 2020-12-24 MED ORDER — VANCOMYCIN HCL IN DEXTROSE 1-5 GM/200ML-% IV SOLN: 1000.0000 mg | Freq: Once | INTRAVENOUS | Status: DC

## 2020-12-24 MED ORDER — SODIUM CHLORIDE 0.9 % IV SOLN: 2.0000 g | Freq: Once | INTRAVENOUS | Status: DC

## 2020-12-24 MED ORDER — MAGNESIUM SULFATE IN D5W 1-5 GM/100ML-% IV SOLN
1.0000 g | Freq: Once | INTRAVENOUS | Status: AC
  Administered 2020-12-24: 20:00:00 1 g via INTRAVENOUS
  Filled 2020-12-24: qty 100

## 2020-12-24 MED ORDER — METOCLOPRAMIDE HCL 5 MG/ML IJ SOLN
10.0000 mg | Freq: Once | INTRAMUSCULAR | Status: AC
  Administered 2020-12-24: 20:00:00 10 mg via INTRAVENOUS
  Filled 2020-12-24: qty 2

## 2020-12-24 MED ORDER — LIDOCAINE HCL 2 % IJ SOLN
5.0000 mL | Freq: Once | INTRAMUSCULAR | Status: AC
  Administered 2020-12-24: 23:00:00 100 mg
  Filled 2020-12-24: qty 20

## 2020-12-24 MED ORDER — HYDROMORPHONE HCL 1 MG/ML IJ SOLN
1.0000 mg | Freq: Once | INTRAMUSCULAR | Status: AC
  Administered 2020-12-24: 23:00:00 1 mg via INTRAVENOUS
  Filled 2020-12-24: qty 1

## 2020-12-24 MED ORDER — SODIUM CHLORIDE 0.9 % IV SOLN
2.0000 g | Freq: Once | INTRAVENOUS | Status: AC
  Administered 2020-12-24: 2 g via INTRAVENOUS
  Filled 2020-12-24: qty 20

## 2020-12-24 MED ORDER — DIPHENHYDRAMINE HCL 50 MG/ML IJ SOLN
12.5000 mg | Freq: Once | INTRAMUSCULAR | Status: AC
  Administered 2020-12-24: 20:00:00 12.5 mg via INTRAVENOUS
  Filled 2020-12-24: qty 1

## 2020-12-24 MED ORDER — VANCOMYCIN HCL IN DEXTROSE 1-5 GM/200ML-% IV SOLN
1000.0000 mg | Freq: Once | INTRAVENOUS | Status: AC
  Administered 2020-12-25: 1000 mg via INTRAVENOUS
  Filled 2020-12-24 (×2): qty 200

## 2020-12-24 MED ORDER — LORAZEPAM 2 MG/ML IJ SOLN
1.0000 mg | Freq: Once | INTRAMUSCULAR | Status: AC
  Administered 2020-12-24: 22:00:00 1 mg via INTRAVENOUS
  Filled 2020-12-24: qty 1

## 2020-12-24 MED ORDER — SODIUM CHLORIDE 0.9 % IV BOLUS
500.0000 mL | Freq: Once | INTRAVENOUS | Status: AC
  Administered 2020-12-24: 20:00:00 500 mL via INTRAVENOUS

## 2020-12-24 MED ORDER — SODIUM CHLORIDE 0.9 % IV SOLN: INTRAVENOUS | Status: DC

## 2020-12-24 NOTE — ED Notes (Signed)
Having pain, frontal head between eyes.  States is migraine headache; seen for same yesterday.  Having  "floaters" in vision.  Light bothers her.  States became worse today and "passed out" this afternoon.  Ambulatory to room without difficulty.

## 2020-12-24 NOTE — ED Provider Notes (Signed)
12:30 AM Assumed care from Dr Almyra Free and Benedetto Goad, please see their note for full history, physical and decision making until this point. In brief this is a 45 y.o. year old female who presented to the ED tonight with Migraine     Here with migraine and multiple other issues. Plan is pending CT scan and reeval. Likely treat for UTI if no other issues on CT to explain leukocytosis and hematuria.   Ct as reported without obvious abnormalities. Informed of pulm nodule. Still with headache. Will treat further.   IV rocephin given. Headache improved. Stable for discharge.   Discharge instructions, including strict return precautions for new or worsening symptoms, given. Patient and/or family verbalized understanding and agreement with the plan as described.   Labs, studies and imaging reviewed by myself and considered in medical decision making if ordered. Imaging interpreted by radiology.  Labs Reviewed  URINALYSIS, ROUTINE W REFLEX MICROSCOPIC - Abnormal; Notable for the following components:      Result Value   Color, Urine RED (*)    APPearance CLOUDY (*)    Specific Gravity, Urine <1.005 (*)    Glucose, UA   (*)    Value: TEST NOT REPORTED DUE TO COLOR INTERFERENCE OF URINE PIGMENT   Hgb urine dipstick   (*)    Value: TEST NOT REPORTED DUE TO COLOR INTERFERENCE OF URINE PIGMENT   Bilirubin Urine   (*)    Value: TEST NOT REPORTED DUE TO COLOR INTERFERENCE OF URINE PIGMENT   Ketones, ur   (*)    Value: TEST NOT REPORTED DUE TO COLOR INTERFERENCE OF URINE PIGMENT   Protein, ur   (*)    Value: TEST NOT REPORTED DUE TO COLOR INTERFERENCE OF URINE PIGMENT   Nitrite   (*)    Value: TEST NOT REPORTED DUE TO COLOR INTERFERENCE OF URINE PIGMENT   Leukocytes,Ua   (*)    Value: TEST NOT REPORTED DUE TO COLOR INTERFERENCE OF URINE PIGMENT   All other components within normal limits  BASIC METABOLIC PANEL - Abnormal; Notable for the following components:   CO2 19 (*)    Glucose, Bld 249 (*)     All other components within normal limits  CBC WITH DIFFERENTIAL/PLATELET - Abnormal; Notable for the following components:   WBC 16.2 (*)    Platelets 547 (*)    Neutro Abs 8.3 (*)    Lymphs Abs 6.7 (*)    All other components within normal limits  URINALYSIS, MICROSCOPIC (REFLEX) - Abnormal; Notable for the following components:   Bacteria, UA RARE (*)    All other components within normal limits  CBG MONITORING, ED - Abnormal; Notable for the following components:   Glucose-Capillary 238 (*)    All other components within normal limits  CBG MONITORING, ED - Abnormal; Notable for the following components:   Glucose-Capillary 206 (*)    All other components within normal limits  WET PREP, GENITAL  URINE CULTURE    CT Abdomen Pelvis W Contrast  Final Result      No follow-ups on file.    Harry Bark, Corene Cornea, MD 12/24/20 [phone]

## 2020-12-24 NOTE — Progress Notes (Signed)
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  At that time patient was felt to be suffering from a migraine and was eventually discharged.  Patient presented to [facility] again on 11/17 with worsening symptoms of headache with associated photophobia and neck stiffness.  Patient found to have a concurrent leukocytosis and thrombocytosis with clinical concern for possible meningitis considering patient's history of meningitis and splenectomy in the past.  Lumbar puncture was performed by Dr. Langston Masker.  CSF samples will be sent to [facility] for processing and will result once that happens.  Patient has been placed empirically on intravenous acyclovir, vancomycin, ceftriaxone.  ER provider reports that patient received a dose of steroids during her presentation less than 24 hours prior and therefore this was not repeated.  Patient is currently AAO x3 and has no cardiac history.  Vital signs are stable and therefore bed request has been placed for a MedSurg bed at [facility].  Vernelle Emerald  MD [facility]

## 2020-12-24 NOTE — Progress Notes (Signed)
Pharmacy Antibiotic Note  Jamie Conley is a 45 y.o. female admitted on 12/24/2020 with meningitis.  WBC 14 and afebrile. Scr appears to be at ~0.7. Pharmacy has been consulted for acyclovir and vancomycin dosing.  Plan: Vancomycin 2000mg  x 1 load   Vancomycin 1000mg  q8h  Obtain troughs as needed Continued ceftriaxone 2g q12h Acyclovir 690mg  q8h with lactated ringer @ 156mL/hr Daily BMET ordered for monitoring F/u renal function, cx, and length of therapy  Height: 5\' 3"  (160 cm) Weight: 93.9 kg (207 lb 0.2 oz) IBW/kg (Calculated) : 52.4  Temp (24hrs), Avg:98.5 F (36.9 C), Min:97 F (36.1 C), Max:99.7 F (37.6 C)  Recent Labs  Lab 12/23/20 2138 12/24/20 1908  WBC 16.2* 14.0*  CREATININE 0.75 0.74    Estimated Creatinine Clearance: 96.7 mL/min (by C-G formula based on SCr of 0.74 mg/dL).    No Known Allergies  Antimicrobials this admission: Ceftriaxone 11/17 > Vancomycin 11/17 > Acyclovir 11/17 >  Dose adjustments this admission:   Microbiology results: 11/17 BCx: sent 11/17: COVID/FLU: neg 11/17 CSF cx: sent  Thank you for allowing pharmacy to participate in this patient's care.  Levonne Spiller, PharmD PGY1 Acute Care Resident  12/24/2020,11:39 PM

## 2020-12-24 NOTE — ED Provider Notes (Signed)
[facility] EMERGENCY DEPT Provider Note   CSN: [id number] Arrival date & time: 12/24/20  1652     History Chief Complaint  Patient presents with   Migraine    Jamie Conley is a 45 y.o. female with history of diabetes, splenectomy, high cholesterol, hypertension, migraines, presented to ED with headache and feeling unwell.  The patient reports that she began having a headache approximately 3 days ago.  She says the headache is been persistent for 3 days.  She says it does not feel like her typical migraines, but instead a squeezing sensation around her temples.  She reports significant sensitivity to light.  She was seen in the emergency department last night for similar symptoms, had her headache improved with IV headache medications, and was treated empirically for possible UTI with a single dose of IV Rocephin.  She said she felt better after medications and went home, but returns today with recurrence of her symptoms.  Her headache is still a maximum intensity.  She reports that she had loss of consciousness earlier today, where she became lightheaded and passed out.  She said this is never happened before.  She does report that she also had some nausea, diarrhea, coughing earlier this week.  She was not tested for COVID or flu last night.  HPI     Past Medical History:  Diagnosis Date   Anxiety    Cancer ([place])    Depression    Diabetes mellitus without complication ([place])    High cholesterol    Hypertension    Migraine     Patient Active Problem List   Diagnosis Date Noted   Headache 12/24/2020    Past Surgical History:  Procedure Laterality Date   ABDOMINAL HYSTERECTOMY     colon polyectomy     NASAL POLYP SURGERY     SPLENECTOMY     WRIST SURGERY Left      OB History   No obstetric history on file.     Family History  Problem Relation Age of Onset   Hypertension Mother    High Cholesterol Mother    Heart attack Father      Social History   Tobacco Use   Smoking status: Never   Smokeless tobacco: Never  Vaping Use   Vaping Use: Never used  Substance Use Topics   Alcohol use: Never   Drug use: Yes    Comment: Delta 8 gummies    Home Medications Prior to Admission medications   Medication Sig Start Date End Date Taking? Authorizing Provider  ALPRAZolam Duanne Moron) 1 MG tablet Take 1 mg by mouth daily as needed for anxiety.   Yes [provider]  atorvastatin (LIPITOR) 40 MG tablet Take 40 mg by mouth daily.   Yes [provider]  escitalopram (LEXAPRO) 10 MG tablet Take 10 mg by mouth daily. 12/11/20  Yes [provider]  lisinopril (ZESTRIL) 20 MG tablet Take 20 mg by mouth daily.   Yes [provider]  metFORMIN (GLUCOPHAGE) 1000 MG tablet Take 1,000 mg by mouth 2 (two) times daily with a meal.   Yes [provider]  topiramate (TOPAMAX) 100 MG tablet Take 100 mg by mouth 2 (two) times daily.   Yes [provider]  traZODone (DESYREL) 50 MG tablet Take 50-100 mg by mouth at bedtime as needed for sleep.   Yes [provider]  butalbital-acetaminophen-caffeine (FIORICET) 50-325-40 MG tablet Take 1-2 tablets by mouth every 6 (six) hours as needed for  headache. 12/24/20 12/24/21  Mesner, Corene Cornea, MD    Allergies    Patient has no known allergies.  Review of Systems   Review of Systems  Constitutional:  Positive for appetite change, chills, fatigue and fever.  HENT:  Negative for ear pain and sore throat.   Eyes:  Positive for photophobia and visual disturbance.  Respiratory:  Negative for cough and shortness of breath.   Cardiovascular:  Negative for chest pain and palpitations.  Gastrointestinal:  Positive for abdominal pain and nausea. Negative for vomiting.  Genitourinary:  Negative for dysuria and hematuria.  Musculoskeletal:  Negative for arthralgias and back pain.  Skin:  Negative for color change and rash.  Neurological:  Positive  for syncope, light-headedness and headaches. Negative for seizures, speech difficulty, weakness and numbness.  All other systems reviewed and are negative.  Physical Exam Updated Vital Signs BP (!) 128/95 (BP Location: Left Arm)   Pulse 86   Temp 98.2 F (36.8 C) (Oral)   Resp 18   Ht 5\' 3"  (1.6 m)   Wt 93.9 kg   SpO2 98%   BMI 36.67 kg/m   Physical Exam Constitutional:      General: She is not in acute distress. HENT:     Head: Normocephalic and atraumatic.  Eyes:     Conjunctiva/sclera: Conjunctivae normal.     Pupils: Pupils are equal, round, and reactive to light.  Cardiovascular:     Rate and Rhythm: Normal rate and regular rhythm.     Pulses: Normal pulses.  Pulmonary:     Effort: Pulmonary effort is normal. No respiratory distress.  Abdominal:     General: There is no distension.     Tenderness: There is no abdominal tenderness.  Musculoskeletal:     Cervical back: Normal range of motion and neck supple. No rigidity.  Skin:    General: Skin is warm and dry.  Neurological:     General: No focal deficit present.     Mental Status: She is alert and oriented to person, place, and time. Mental status is at baseline.     Cranial Nerves: No cranial nerve deficit.     Sensory: No sensory deficit.  Psychiatric:        Mood and Affect: Mood normal.        Behavior: Behavior normal.    ED Results / Procedures / Treatments   Labs (all labs ordered are listed, but only abnormal results are displayed) Labs Reviewed  URINALYSIS, ROUTINE W REFLEX MICROSCOPIC - Abnormal; Notable for the following components:      Result Value   Specific Gravity, Urine 1.036 (*)    Glucose, UA >1,000 (*)    Hgb urine dipstick LARGE (*)    RBC / HPF >50 (*)    All other components within normal limits  BASIC METABOLIC PANEL - Abnormal; Notable for the following components:   Glucose, Bld 269 (*)    All other components within normal limits  CBC WITH DIFFERENTIAL/PLATELET - Abnormal;  Notable for the following components:   WBC 14.0 (*)    Platelets 527 (*)    Lymphs Abs 5.8 (*)    All other components within normal limits  CSF CELL COUNT WITH DIFFERENTIAL - Abnormal; Notable for the following components:   RBC Count, CSF 66 (*)    All other components within normal limits  CSF CELL COUNT WITH DIFFERENTIAL - Abnormal; Notable for the following components:   RBC Count, CSF 14 (*)  All other components within normal limits  PROTEIN AND GLUCOSE, CSF - Abnormal; Notable for the following components:   Glucose, CSF 145 (*)    Total  Protein, CSF 60 (*)    All other components within normal limits  BASIC METABOLIC PANEL - Abnormal; Notable for the following components:   Potassium 3.4 (*)    CO2 20 (*)    Glucose, Bld 254 (*)    Calcium 8.6 (*)    All other components within normal limits  GLUCOSE, CAPILLARY - Abnormal; Notable for the following components:   Glucose-Capillary 211 (*)    All other components within normal limits  RESP PANEL BY RT-PCR (FLU A&B, COVID) ARPGX2  CSF CULTURE W GRAM STAIN  ANAEROBIC CULTURE W GRAM STAIN  CULTURE, BLOOD (ROUTINE X 2)  CULTURE, BLOOD (ROUTINE X 2)  CULTURE, BLOOD (ROUTINE X 2) W REFLEX TO ID PANEL  CULTURE, BLOOD (ROUTINE X 2) W REFLEX TO ID PANEL  CRYPTOCOCCAL ANTIGEN, CSF  MAGNESIUM  HSV 1/2 PCR, CSF  OLIGOCLONAL BANDS, CSF + SERM  DRAW EXTRA CLOT TUBE  HEMOGLOBIN A1C    EKG None  Radiology CT Angio Head W or Wo Contrast  Result Date: 12/24/2020 CLINICAL DATA:  Migraine and lightheadedness.  Syncope. EXAM: CT ANGIOGRAPHY HEAD TECHNIQUE: Multidetector CT imaging of the head was performed using the standard protocol during bolus administration of intravenous contrast. Multiplanar CT image reconstructions and MIPs were obtained to evaluate the vascular anatomy. CONTRAST:  170mL OMNIPAQUE IOHEXOL 350 MG/ML SOLN COMPARISON:  None FINDINGS: CT HEAD Brain: There is no mass, hemorrhage or extra-axial collection. The  size and configuration of the ventricles and extra-axial CSF spaces are normal. The brain parenchyma is normal, without acute or chronic infarction. Vascular: No abnormal hyperdensity of the major intracranial arteries or dural venous sinuses. No intracranial atherosclerosis. Skull: The visualized skull base, calvarium and extracranial soft tissues are normal. Sinuses/Orbits: No fluid levels or advanced mucosal thickening of the visualized paranasal sinuses. No mastoid or middle ear effusion. The orbits are normal. CTA HEAD POSTERIOR CIRCULATION: --Vertebral arteries: Normal --Inferior cerebellar arteries: Normal. --Basilar artery: Normal. --Superior cerebellar arteries: Normal. --Posterior cerebral arteries: Normal. ANTERIOR CIRCULATION: --Intracranial internal carotid arteries: Normal. --Anterior cerebral arteries (ACA): Normal. --Middle cerebral arteries (MCA): Normal. ANATOMIC VARIANTS: None Review of the MIP images confirms the above findings. IMPRESSION: Normal CT/CTA of the head. Electronically Signed   By: Ulyses Jarred M.D.   On: 12/24/2020 20:32   CT Abdomen Pelvis W Contrast  Result Date: 12/23/2020 CLINICAL DATA:  Abdominal pain with nausea and vomiting. EXAM: CT ABDOMEN AND PELVIS WITH CONTRAST TECHNIQUE: Multidetector CT imaging of the abdomen and pelvis was performed using the standard protocol following bolus administration of intravenous contrast. CONTRAST:  16mL OMNIPAQUE IOHEXOL 350 MG/ML SOLN COMPARISON:  November 05, 2020 FINDINGS: Lower chest: A stable 6 mm pleural based noncalcified lung nodule is seen along the posterolateral aspect of the right lower lobe. Hepatobiliary: There is diffuse fatty infiltration of the liver parenchyma. No focal liver abnormality is seen. No gallstones, gallbladder wall thickening, or biliary dilatation. Pancreas: Unremarkable. No pancreatic ductal dilatation or surrounding inflammatory changes. Spleen: The spleen is surgically absent. Adrenals/Urinary  Tract: Adrenal glands are unremarkable. Kidneys are normal, without obstructing renal calculi or focal lesions. A 7 mm nonobstructing renal calculus is seen within the left kidney. Mild, stable left-sided hydronephrosis is seen. Bladder is unremarkable. Stomach/Bowel: Stomach is within normal limits. Appendix appears normal. No evidence of bowel wall thickening, distention, or  inflammatory changes. Vascular/Lymphatic: Mild aortic atherosclerosis. No enlarged abdominal or pelvic lymph nodes. Reproductive: Status post hysterectomy. No adnexal masses. Other: 4.1 cm x 2.7 cm and 1.8 cm x 1.7 cm fat containing ventral hernias are seen just above the level of the umbilicus. Musculoskeletal: No acute or significant osseous findings. IMPRESSION: 1. Hepatic steatosis. 2. Stable 6 mm pleural based right lower lobe lung nodule along the posterolateral aspect of the right lower lobe. Non-contrast chest CT at 6-12 months is recommended. If the nodule is stable at time of repeat CT, then future CT at 18-24 months (from today's scan) is considered optional for low-risk patients, but is recommended for high-risk patients. This recommendation follows the consensus statement: Guidelines for Management of Incidental Pulmonary Nodules Detected on CT Images: From the Fleischner Society 2017; Radiology 2017; 284:228-243. 3. Nonobstructing left renal calculus. 4. Absent spleen. 5. Aortic atherosclerosis. Aortic Atherosclerosis (ICD10-I70.0). Electronically Signed   By: Virgina Norfolk M.D.   On: 12/23/2020 23:47    Procedures .Lumbar Puncture  Date/Time: 12/25/2020 8:39 AM Performed by: Wyvonnia Dusky, MD Authorized by: Wyvonnia Dusky, MD   Consent:    Consent obtained:  Verbal   Consent given by:  Patient   Risks, benefits, and alternatives were discussed: yes     Risks discussed:  Bleeding, repeat procedure, pain, infection, nerve damage and headache   Alternatives discussed:  Observation and alternative  treatment Universal protocol:    Procedure explained and questions answered to patient or proxy's satisfaction: yes     Relevant documents present and verified: yes     Test results available: yes     Imaging studies available: yes     Required blood products, implants, devices, and special equipment available: yes     Immediately prior to procedure a time out was called: yes     Site/side marked: yes     Patient identity confirmed:  Arm band and verbally with patient Pre-procedure details:    Procedure purpose:  Diagnostic   Preparation: Patient was prepped and draped in usual sterile fashion   Anesthesia:    Anesthesia method:  Local infiltration   Local anesthetic:  Lidocaine 2% w/o epi Procedure details:    Lumbar space:  L4-L5 interspace   Patient position:  Sitting   Needle gauge:  22   Ultrasound guidance: no     Number of attempts:  1   Fluid appearance:  Clear   Tubes of fluid:  4   Total volume (ml):  4 Post-procedure details:    Puncture site:  Adhesive bandage applied and direct pressure applied   Procedure completion:  Tolerated well, no immediate complications   Medications Ordered in ED Medications  0.9 %  sodium chloride infusion ( Intravenous Duplicate 89/37/34 2876)  acyclovir (ZOVIRAX) 690 mg in dextrose 5 % 100 mL IVPB (690 mg Intravenous New Bag/Given 12/25/20 0341)  lactated ringers infusion ( Intravenous Infusion Verify 12/25/20 0600)  ketorolac (TORADOL) 30 MG/ML injection 30 mg (30 mg Intravenous Given 12/25/20 0628)  HYDROmorphone (DILAUDID) injection 1 mg (has no administration in time range)  0.45 % NaCl with KCl 20 mEq / L infusion (has no administration in time range)  acetaminophen (TYLENOL) tablet 650 mg (has no administration in time range)    Or  acetaminophen (TYLENOL) suppository 650 mg (has no administration in time range)  ondansetron (ZOFRAN) tablet 4 mg (has no administration in time range)    Or  ondansetron (ZOFRAN) injection 4 mg  (has no  administration in time range)  insulin aspart (novoLOG) injection 0-15 Units (has no administration in time range)  butalbital-acetaminophen-caffeine (FIORICET) 50-325-40 MG per tablet 1 tablet (has no administration in time range)  topiramate (TOPAMAX) tablet 100 mg (has no administration in time range)  escitalopram (LEXAPRO) tablet 10 mg (has no administration in time range)  traZODone (DESYREL) tablet 50-100 mg (has no administration in time range)  ALPRAZolam (XANAX) tablet 1 mg (has no administration in time range)  atorvastatin (LIPITOR) tablet 40 mg (has no administration in time range)  lisinopril (ZESTRIL) tablet 20 mg (has no administration in time range)  ketorolac (TORADOL) 15 MG/ML injection 15 mg (15 mg Intravenous Given 12/24/20 1936)  metoCLOPramide (REGLAN) injection 10 mg (10 mg Intravenous Given 12/24/20 1936)  magnesium sulfate IVPB 1 g 100 mL (0 g Intravenous Stopped 12/25/20 0049)  sodium chloride 0.9 % bolus 500 mL (0 mLs Intravenous Stopped 12/25/20 0050)  diphenhydrAMINE (BENADRYL) injection 12.5 mg (12.5 mg Intravenous Given 12/24/20 1936)  iohexol (OMNIPAQUE) 350 MG/ML injection 75 mL (100 mLs Intravenous Contrast Given 12/24/20 1958)  LORazepam (ATIVAN) injection 1 mg (1 mg Intravenous Given 12/24/20 2200)  lidocaine (XYLOCAINE) 2 % (with pres) injection 100 mg (100 mg Infiltration Given by Other 12/24/20 2230)  HYDROmorphone (DILAUDID) injection 1 mg (1 mg Intravenous Given 12/24/20 2316)  vancomycin (VANCOCIN) IVPB 1000 mg/200 mL premix (0 mg Intravenous Stopped 12/25/20 0132)  HYDROmorphone (DILAUDID) injection 1 mg (1 mg Intravenous Given 12/25/20 0342)    ED Course  I have reviewed the triage vital signs and the nursing notes.  Pertinent labs & imaging results that were available during my care of the patient were reviewed by me and considered in my medical decision making (see chart for details).  Differential diagnosis includes viral syndrome  including influenza or COVID versus complex migraine versus meningitis vs aneurysm vs other  She is neurologically intact.  However with her syncope and worsening headache which is atypical for her I have ordered a CT angio of the head to evaluate for aneurysm.  She does report some neck pain but does not have meningismus on my exam.  We will recheck her white blood cell count.  If her work-up is unremarkable discussed with her the possibility of needing a lumbar puncture.  She has a history of splenectomy which puts her at high risk for infection.  She did receive Rocephin last night empirically IV for possible UTI.  She reports she was not prescribed antibiotics for the UTI however.  I personally reviewed her prior medical records including her ED visit yesterday.  I reviewed and interpreted her imaging and work-up today, notable for unremarkable CTA, covid/flu negative, WBC elevated at 14 but slightly downtrending from yesterday, BMP with mild hyperglycemia 269  IV migraine and pain medications given in ED with some improvement of pain from 10/10 to 7/10  I reviewed her prior medical records including recent ED visits and labs  With no clear cause of her symptoms, I consented the patient for LP which was successfully performed on first attempt, with clear CSF fluid sent to lab.  Patient started empirically on antibiotics for bacterial and viral meningitis - she received steroids yesterday in ED, and given her diabetes I would hold off on further steroids at this time.  Clinically she is well appearing at the time of admission, with no evidence of confusion or encephalopathy.  Clinical Course as of 12/25/20 0842  Thu Dec 24, 2020  2240 LP completed, will order  IV antibiotics, IV acyclovir for empiric meningitis coverage, admit to hospital [MT]  2256 Admitted to dr shalhoub hospitalist [MT]    Clinical Course User Index [MT] Wyvonnia Dusky, MD   Final Clinical Impression(s) / ED  Diagnoses Final diagnoses:  Meningitis    Rx / DC Orders ED Discharge Orders     None        Wyvonnia Dusky, MD 12/25/20 [phone]

## 2020-12-24 NOTE — ED Notes (Signed)
Called [facility] to transport patient to [facility] [id number]

## 2020-12-24 NOTE — ED Triage Notes (Signed)
Patient here POV from Home with Lightheadedness and Migraine.  Patient has Chronic History of Migraines but states this one is different. Patient did have syncopal episode. Patient was seen yesterday and treated for multiple complaints and symptoms but Patient did have Syncopal Episode today upon exiting shower today.   NAD Noted during Triage. A&Ox4. GCS 15. Ambulatory. No Fevers.

## 2020-12-25 ENCOUNTER — Observation Stay (HOSPITAL_COMMUNITY): Payer: Self-pay

## 2020-12-25 ENCOUNTER — Inpatient Hospital Stay (HOSPITAL_COMMUNITY): Payer: Self-pay

## 2020-12-25 ENCOUNTER — Encounter (HOSPITAL_COMMUNITY): Payer: Self-pay | Admitting: Internal Medicine

## 2020-12-25 DIAGNOSIS — I700 Atherosclerosis of aorta: Secondary | ICD-10-CM

## 2020-12-25 DIAGNOSIS — K760 Fatty (change of) liver, not elsewhere classified: Secondary | ICD-10-CM

## 2020-12-25 DIAGNOSIS — D72829 Elevated white blood cell count, unspecified: Secondary | ICD-10-CM | POA: Diagnosis present

## 2020-12-25 DIAGNOSIS — R55 Syncope and collapse: Secondary | ICD-10-CM

## 2020-12-25 DIAGNOSIS — I10 Essential (primary) hypertension: Secondary | ICD-10-CM | POA: Diagnosis present

## 2020-12-25 DIAGNOSIS — E1165 Type 2 diabetes mellitus with hyperglycemia: Secondary | ICD-10-CM | POA: Diagnosis present

## 2020-12-25 DIAGNOSIS — E785 Hyperlipidemia, unspecified: Secondary | ICD-10-CM | POA: Diagnosis present

## 2020-12-25 DIAGNOSIS — E876 Hypokalemia: Secondary | ICD-10-CM

## 2020-12-25 DIAGNOSIS — G441 Vascular headache, not elsewhere classified: Secondary | ICD-10-CM

## 2020-12-25 DIAGNOSIS — F419 Anxiety disorder, unspecified: Secondary | ICD-10-CM | POA: Diagnosis present

## 2020-12-25 DIAGNOSIS — F32A Depression, unspecified: Secondary | ICD-10-CM | POA: Diagnosis present

## 2020-12-25 DIAGNOSIS — G43909 Migraine, unspecified, not intractable, without status migrainosus: Secondary | ICD-10-CM | POA: Diagnosis present

## 2020-12-25 LAB — MAGNESIUM: Magnesium: 2.1 mg/dL (ref 1.7–2.4)

## 2020-12-25 LAB — CSF CELL COUNT WITH DIFFERENTIAL
RBC Count, CSF: 14 /mm3 — ABNORMAL HIGH
RBC Count, CSF: 66 /mm3 — ABNORMAL HIGH
Tube #: 1
Tube #: 4
WBC, CSF: 1 /mm3 (ref 0–5)
WBC, CSF: 4 /mm3 (ref 0–5)

## 2020-12-25 LAB — HEMOGLOBIN A1C
Hgb A1c MFr Bld: 11.4 % — ABNORMAL HIGH (ref 4.8–5.6)
Mean Plasma Glucose: 280.48 mg/dL

## 2020-12-25 LAB — ECHOCARDIOGRAM COMPLETE
AR max vel: 2.32 cm2
AV Peak grad: 10.9 mmHg
Ao pk vel: 1.65 m/s
Area-P 1/2: 3.91 cm2
Calc EF: 58.5 %
Height: 63 in
S' Lateral: 2.8 cm
Single Plane A2C EF: 57.8 %
Single Plane A4C EF: 58.6 %
Weight: 3312.19 oz

## 2020-12-25 LAB — GLUCOSE, CAPILLARY
Glucose-Capillary: 211 mg/dL — ABNORMAL HIGH (ref 70–99)
Glucose-Capillary: 224 mg/dL — ABNORMAL HIGH (ref 70–99)
Glucose-Capillary: 222 mg/dL — ABNORMAL HIGH (ref 70–99)
Glucose-Capillary: 170 mg/dL — ABNORMAL HIGH (ref 70–99)

## 2020-12-25 LAB — BASIC METABOLIC PANEL
Anion gap: 9 (ref 5–15)
BUN: 15 mg/dL (ref 6–20)
CO2: 20 mmol/L — ABNORMAL LOW (ref 22–32)
Calcium: 8.6 mg/dL — ABNORMAL LOW (ref 8.9–10.3)
Chloride: 107 mmol/L (ref 98–111)
Creatinine, Ser: 0.61 mg/dL (ref 0.44–1.00)
GFR, Estimated: >60 mL/min (ref >60)
Glucose, Bld: 254 mg/dL — ABNORMAL HIGH (ref 70–99)
Potassium: 3.4 mmol/L — ABNORMAL LOW (ref 3.5–5.1)
Sodium: 136 mmol/L (ref 135–145)

## 2020-12-25 LAB — CRYPTOCOCCAL ANTIGEN, CSF: Crypto Ag: NEGATIVE

## 2020-12-25 MED ORDER — METHOCARBAMOL 500 MG PO TABS
500.0000 mg | ORAL_TABLET | Freq: Four times a day (QID) | ORAL | Status: DC | PRN
  Administered 2020-12-25 – 2021-01-01 (×21): 500 mg via ORAL
  Filled 2020-12-25 (×21): qty 1

## 2020-12-25 MED ORDER — PROCHLORPERAZINE EDISYLATE 10 MG/2ML IJ SOLN
10.0000 mg | Freq: Four times a day (QID) | INTRAMUSCULAR | Status: AC
  Administered 2020-12-25 – 2020-12-26 (×3): 10 mg via INTRAVENOUS
  Filled 2020-12-25 (×3): qty 2

## 2020-12-25 MED ORDER — HYDROMORPHONE HCL 1 MG/ML IJ SOLN
1.0000 mg | Freq: Once | INTRAMUSCULAR | Status: AC
  Administered 2020-12-25: 1 mg via INTRAVENOUS
  Filled 2020-12-25: qty 1

## 2020-12-25 MED ORDER — DIVALPROEX SODIUM 250 MG PO DR TAB
1000.0000 mg | DELAYED_RELEASE_TABLET | Freq: Once | ORAL | Status: AC
  Administered 2020-12-25: 1000 mg via ORAL
  Filled 2020-12-25: qty 4

## 2020-12-25 MED ORDER — DIAZEPAM 5 MG/ML IJ SOLN
5.0000 mg | Freq: Once | INTRAMUSCULAR | Status: AC
  Administered 2020-12-25: 5 mg via INTRAVENOUS
  Filled 2020-12-25: qty 2

## 2020-12-25 MED ORDER — ALPRAZOLAM 1 MG PO TABS
1.0000 mg | ORAL_TABLET | Freq: Every day | ORAL | Status: DC | PRN
  Administered 2020-12-25 – 2020-12-27 (×2): 1 mg via ORAL
  Filled 2020-12-25 (×2): qty 1

## 2020-12-25 MED ORDER — LORAZEPAM 2 MG/ML IJ SOLN: 1.0000 mg | Freq: Once | INTRAMUSCULAR | Status: DC

## 2020-12-25 MED ORDER — HYDROMORPHONE HCL 1 MG/ML IJ SOLN
1.0000 mg | INTRAMUSCULAR | Status: AC | PRN
  Administered 2020-12-25 – 2020-12-26 (×3): 1 mg via INTRAVENOUS
  Filled 2020-12-25 (×3): qty 1

## 2020-12-25 MED ORDER — MAGNESIUM SULFATE 2 GM/50ML IV SOLN
2.0000 g | Freq: Once | INTRAVENOUS | Status: AC
Start: 2020-12-25 — End: 2020-12-25
  Administered 2020-12-25: 2 g via INTRAVENOUS
  Filled 2020-12-25: qty 50

## 2020-12-25 MED ORDER — TOPIRAMATE 100 MG PO TABS
100.0000 mg | ORAL_TABLET | Freq: Two times a day (BID) | ORAL | Status: DC
  Administered 2020-12-25 – 2020-12-29 (×10): 100 mg via ORAL
  Filled 2020-12-25 (×10): qty 1

## 2020-12-25 MED ORDER — ESCITALOPRAM OXALATE 10 MG PO TABS
10.0000 mg | ORAL_TABLET | Freq: Every day | ORAL | Status: DC
  Administered 2020-12-25 – 2020-12-29 (×5): 10 mg via ORAL
  Filled 2020-12-25 (×5): qty 1

## 2020-12-25 MED ORDER — ACETAMINOPHEN 650 MG RE SUPP: 650.0000 mg | Freq: Four times a day (QID) | RECTAL | Status: DC | PRN

## 2020-12-25 MED ORDER — BUTALBITAL-APAP-CAFFEINE 50-325-40 MG PO TABS
1.0000 | ORAL_TABLET | ORAL | Status: DC | PRN
  Administered 2020-12-25: 1 via ORAL
  Filled 2020-12-25: qty 1

## 2020-12-25 MED ORDER — ONDANSETRON HCL 4 MG PO TABS: 4.0000 mg | ORAL_TABLET | Freq: Four times a day (QID) | ORAL | Status: DC | PRN

## 2020-12-25 MED ORDER — ATORVASTATIN CALCIUM 40 MG PO TABS
40.0000 mg | ORAL_TABLET | Freq: Every day | ORAL | Status: DC
  Administered 2020-12-25 – 2021-01-01 (×8): 40 mg via ORAL
  Filled 2020-12-25 (×8): qty 1

## 2020-12-25 MED ORDER — POTASSIUM CHLORIDE IN NACL 20-0.45 MEQ/L-% IV SOLN
INTRAVENOUS | Status: DC
  Filled 2020-12-25 (×5): qty 1000

## 2020-12-25 MED ORDER — ACETAMINOPHEN 325 MG PO TABS: 650.0000 mg | ORAL_TABLET | Freq: Four times a day (QID) | ORAL | Status: DC | PRN

## 2020-12-25 MED ORDER — TRAZODONE HCL 50 MG PO TABS
50.0000 mg | ORAL_TABLET | Freq: Every evening | ORAL | Status: DC | PRN
  Administered 2020-12-25 – 2020-12-26 (×2): 100 mg via ORAL
  Administered 2020-12-27: 50 mg via ORAL
  Administered 2020-12-28 – 2020-12-31 (×4): 100 mg via ORAL
  Filled 2020-12-25 (×7): qty 2

## 2020-12-25 MED ORDER — METHOCARBAMOL 500 MG PO TABS
1000.0000 mg | ORAL_TABLET | Freq: Once | ORAL | Status: AC
  Administered 2020-12-25: 1000 mg via ORAL
  Filled 2020-12-25: qty 2

## 2020-12-25 MED ORDER — SODIUM CHLORIDE 0.9% FLUSH
3.0000 mL | Freq: Two times a day (BID) | INTRAVENOUS | Status: DC
  Administered 2020-12-25 – 2021-01-01 (×13): 3 mL via INTRAVENOUS

## 2020-12-25 MED ORDER — HYDROMORPHONE HCL 1 MG/ML IJ SOLN
1.0000 mg | INTRAMUSCULAR | Status: AC | PRN
  Administered 2020-12-25 (×3): 1 mg via INTRAVENOUS
  Filled 2020-12-25 (×3): qty 1

## 2020-12-25 MED ORDER — LISINOPRIL 20 MG PO TABS
20.0000 mg | ORAL_TABLET | Freq: Every day | ORAL | Status: DC
  Administered 2020-12-25 – 2021-01-01 (×8): 20 mg via ORAL
  Filled 2020-12-25 (×8): qty 1

## 2020-12-25 MED ORDER — INSULIN ASPART 100 UNIT/ML IJ SOLN
0.0000 [IU] | Freq: Three times a day (TID) | INTRAMUSCULAR | Status: DC
  Administered 2020-12-25 – 2020-12-26 (×5): 5 [IU] via SUBCUTANEOUS
  Administered 2020-12-27 (×2): 3 [IU] via SUBCUTANEOUS
  Administered 2020-12-27: 5 [IU] via SUBCUTANEOUS
  Administered 2020-12-28: 3 [IU] via SUBCUTANEOUS
  Administered 2020-12-28: 5 [IU] via SUBCUTANEOUS
  Administered 2020-12-28: 3 [IU] via SUBCUTANEOUS
  Administered 2020-12-29: 5 [IU] via SUBCUTANEOUS
  Administered 2020-12-29: 3 [IU] via SUBCUTANEOUS
  Administered 2020-12-29: 5 [IU] via SUBCUTANEOUS
  Administered 2020-12-30 (×3): 8 [IU] via SUBCUTANEOUS
  Administered 2020-12-31 (×3): 3 [IU] via SUBCUTANEOUS
  Administered 2021-01-01: 5 [IU] via SUBCUTANEOUS

## 2020-12-25 MED ORDER — DIPHENHYDRAMINE HCL 50 MG/ML IJ SOLN
12.5000 mg | Freq: Two times a day (BID) | INTRAMUSCULAR | Status: DC
  Administered 2020-12-25 – 2021-01-01 (×14): 12.5 mg via INTRAVENOUS
  Filled 2020-12-25 (×14): qty 1

## 2020-12-25 MED ORDER — GADOBUTROL 1 MMOL/ML IV SOLN
10.0000 mL | Freq: Once | INTRAVENOUS | Status: AC | PRN
  Administered 2020-12-25: 10 mL via INTRAVENOUS

## 2020-12-25 MED ORDER — ONDANSETRON HCL 4 MG/2ML IJ SOLN: 4.0000 mg | Freq: Four times a day (QID) | INTRAMUSCULAR | Status: DC | PRN

## 2020-12-25 MED ORDER — KETOROLAC TROMETHAMINE 30 MG/ML IJ SOLN
30.0000 mg | Freq: Four times a day (QID) | INTRAMUSCULAR | Status: DC | PRN
  Administered 2020-12-25 – 2020-12-28 (×4): 30 mg via INTRAVENOUS
  Filled 2020-12-25 (×6): qty 1

## 2020-12-25 NOTE — Progress Notes (Signed)
Inpatient Diabetes Program Recommendations  AACE/ADA: New Consensus Statement on Inpatient Glycemic Control   Target Ranges:  Prepandial:   less than 140 mg/dL      Peak postprandial:   less than 180 mg/dL (1-2 hours)      Critically ill patients:  140 - 180 mg/dL    Latest Reference Range & Units 12/24/20 00:19 12/25/20 08:13 12/25/20 11:20  Glucose-Capillary 70 - 99 mg/dL 206 (H) 211 (H) 224 (H)  (H): Data is abnormally high   Latest Reference Range & Units 12/24/20 19:08 12/25/20 05:44  Glucose 70 - 99 mg/dL 269 (H) 254 (H)  Hemoglobin A1C 4.8 - 5.6 %  11.4 (H)  (H): Data is abnormally high  Review of Glycemic Control  Diabetes history: DM2 Outpatient Diabetes medications: Metformin 1000 mg BID Current orders for Inpatient glycemic control: Novolog 0-15 units TID with meals  Inpatient Diabetes Program Recommendations:    Insulin: IF glucose remains consistently over 180 mg/dl, please consider ordering Semglee 9 units Q24H (based on 93.9 kg x 0.1 units).  HbgA1C: A1C 11.4% on 12/25/20 indicating an average glucose of 280 mg/dl over the past 2-3 months.   NOTE: Spoke with patient over the phone about diabetes and home regimen for diabetes control. Patient reports she has no insurance and does not have a PCP locally. Patient reports that she has had DM2 for 10-15 years and that she is consistently taking Metformin 1000 mg BID. Patient reports that she checks her glucose 4 times per day and it is always high, usually in 300's mg/dl.   Patient reports last A1C was 7.3% one year ago. Discussed A1C results (11.4% on 12/25/20) and explained that current A1C indicates an average glucose of 280 mg/dl over the past 2-3 months. Discussed glucose and A1C goals. Discussed importance of checking CBGs and maintaining good CBG control to prevent long-term and short-term complications. Patient reports that she eats healthy and notes that she has lost 85 pounds since June 2022 (has been intentionally  trying to lose weight). Inquired about ever using insulin in the past and patient states she has not used insulin herself but she states that she has given herself SQ injections (vial/syringe) for other issues and she has given her father insulin injections using insulin pens in the past. Patient states that she feels like she needs to be on insulin for now to get DM controlled and states she would be willing to take insulin if prescribed at discharge. Patient states she would prefer insulin pens but willing to use vial/syringe if she needs to.  Patient states she has a follow up appointment set up with local clinic (set up by [facility]) in January 2023. Encouraged patient to take DM medications as prescribed at discharge, to continue to check glucose 4 times a day and be sure to follow up with clinic as scheduled.   Patient verbalized understanding of information discussed and reports no further questions at this time related to diabetes. If patient is discharged on insulin, please consider prescribing affordable insulin (patient prefers insulin pens and would also need insulin pen needles).  Thanks, Barnie Alderman, RN, MSN, CDE Diabetes Coordinator Inpatient Diabetes Program [phone] (Team Pager)  ]

## 2020-12-25 NOTE — Plan of Care (Signed)
  Problem: Nutrition: Goal: Adequate nutrition will be maintained Outcome: Progressing   Problem: Safety: Goal: Ability to remain free from injury will improve Outcome: Progressing   
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
## 2020-12-25 NOTE — H&P (Signed)
History and Physical    Jamie Conley [id number]:[id number] DOB: 1975-05-30 DOA: 12/24/2020  PCP: Pcp, No   Patient coming from: Home.  I have personally briefly reviewed patient's old medical records in [place]  Chief Complaint: Headache.  HPI: Jamie Conley is a 45 y.o. female with medical history significant of anxiety, depression, cancer, type II DM, hyperlipidemia, hypertension, migraine headaches who came to the emergency department yesterday evening for the second time in the last 3 days due to headache, associated with photophobia, nausea, lightheadedness and neck stiffness.  She underwent an LP in the emergency department and was ruled out for bacterial meningitis.  She remains on IV acyclovir.  The patient states that she used to live in [place] but moved here because allergies were making her migraine headaches worse.  Her headaches have been a lot better here, except for the past few days.  She mentioned that she normally gets her headache on the top of her head and this headache feels like a pressure on both temples.  No fever, chills, sore throat, rhinorrhea, wheezing or hemoptysis.  The patient has been feeling chest pressure, dyspnea and palpitations which he attributes to her panic attack.  However, the patient has had 2 syncopal episodes related to 10 migraine headaches.  Denied abdominal pain, diarrhea, constipation, melena or hematochezia.  No dysuria, frequency or hematuria.  No recent polyuria, polydipsia, polyphagia or blurred vision, however her blood glucose been very well controlled recently.  ED Course: 99.7 F, pulse 97, respiration 18, BP 135/82 mmHg O2 sat 99% on room air.  The patient received a 500 mL NS bolus, started on acyclovir, given vancomycin and ceftriaxone.  Antibacterials were discontinued after CSF cell count came back.  She also received diphenhydramine 12.5 mg IVP x1, lorazepam 1 mg IVP 3 LP, metoclopramide 10 mg IVP x1,  magnesium sulfate 1 g IVPB, 15 mg of ketorolac and hydromorphone 1 mg IVP x2.  Lab work: Her urinalysis showed glucosuria more than 1000 mg/dL, large hemoglobinuria more than 50 RBC per hpf on microscopic examination.  CBC is her white count of 14.0, Moding 14.2 g/dL platelets 527.  Initial BMP showed a glucose of 369 mg/dL but was otherwise unremarkable.  Follow-up BMP showed a potassium of 3.4 and CO2 of 20 mmol/L with a normal anion gap.  Glucose 254 and calcium 8.6 mg/dL.  Renal function, sodium and chloride were normal.  CSF cell count show RBC of 66, WBC of 4 and then RBC of 14, WBC of 4 on tubes #1 and #2 respectively.  Hemoglobin A1c was 11.4%.  Imaging: CT angio head was normal.  CT abdomen pelvis with contrast showed hepatic asteatosis, stable 6 mm pleural-based right lower lobe lung nodule, left nonobstructing nephrolithiasis, absent spleen and aortic atherosclerosis.  Please see images and full radiology report for further details.  Review of Systems: As per HPI otherwise all other systems reviewed and are negative.  Past Medical History:  Diagnosis Date   Anxiety    Cancer ([place])    Depression    Diabetes mellitus without complication ([place])    High cholesterol    Hypertension    Migraine     Past Surgical History:  Procedure Laterality Date   ABDOMINAL HYSTERECTOMY     colon polyectomy     NASAL POLYP SURGERY     SPLENECTOMY     WRIST SURGERY Left     Social History  reports that she has never smoked. She  has never used smokeless tobacco. She reports current drug use. She reports that she does not drink alcohol.  No Known Allergies  Family History  Problem Relation Age of Onset   Hypertension Mother    High Cholesterol Mother    Heart attack Father    Prior to Admission medications   Medication Sig Start Date End Date Taking? Authorizing Provider  ALPRAZolam Duanne Moron) 1 MG tablet Take 1 mg by mouth daily as needed for anxiety.   Yes [provider]   atorvastatin (LIPITOR) 40 MG tablet Take 40 mg by mouth daily.   Yes [provider]  escitalopram (LEXAPRO) 10 MG tablet Take 10 mg by mouth daily. 12/11/20  Yes [provider]  lisinopril (ZESTRIL) 20 MG tablet Take 20 mg by mouth daily.   Yes [provider]  metFORMIN (GLUCOPHAGE) 1000 MG tablet Take 1,000 mg by mouth 2 (two) times daily with a meal.   Yes [provider]  topiramate (TOPAMAX) 100 MG tablet Take 100 mg by mouth 2 (two) times daily.   Yes [provider]  traZODone (DESYREL) 50 MG tablet Take 50-100 mg by mouth at bedtime as needed for sleep.   Yes [provider]  butalbital-acetaminophen-caffeine (FIORICET) 50-325-40 MG tablet Take 1-2 tablets by mouth every 6 (six) hours as needed for headache. 12/24/20 12/24/21  Mesner, Corene Cornea, MD   Physical Exam: Vitals:   12/24/20 2100 12/24/20 2115 12/25/20 0233 12/25/20 0610  BP: 99/76 (!) 115/103 (!) 136/93 (!) 128/95  Pulse: (!) 120 96 81 86  Resp: 18 18  18   Temp:   98.4 F (36.9 C) 98.2 F (36.8 C)  TempSrc:   Oral Oral  SpO2: (!) 88% 100% 99% 98%  Weight:      Height:       Constitutional: NAD, calm, comfortable Eyes: PERRL, lids and conjunctivae normal ENMT: Mucous membranes are moist. Posterior pharynx clear of any exudate or lesions. Neck: normal, supple, no masses, no thyromegaly Respiratory: clear to auscultation bilaterally, no wheezing, no crackles. Normal respiratory effort. No accessory muscle use.  Cardiovascular: Regular rate and rhythm, no murmurs / rubs / gallops. No extremity edema. 2+ pedal pulses. No carotid bruits.  Abdomen: Obese, no distention.  Bowel sounds positive.  Soft, no tenderness, no masses palpated. No hepatosplenomegaly.  Musculoskeletal: no clubbing / cyanosis. Good ROM, no contractures. Normal muscle tone.  Skin: no rashes, lesions, ulcers on limited dermatological semination. Neurologic: CN 2-12 grossly intact. Sensation intact, DTR  normal. Strength 5/5 in all 4.  Psychiatric: Normal judgment and insight. Alert and oriented x 3.  Tearful with pain.  Anxious mood.   Labs on Admission: I have personally reviewed following labs and imaging studies  CBC: Recent Labs  Lab 12/23/20 2138 12/24/20 1908  WBC 16.2* 14.0*  NEUTROABS 8.3* 6.9  HGB 14.2 14.2  HCT 41.0 42.6  MCV 89.9 92.6  PLT 547* 527*    Basic Metabolic Panel: Recent Labs  Lab 12/23/20 2138 12/24/20 1908 12/25/20 0544  NA 138 138 136  K 3.6 3.7 3.4*  CL 106 103 107  CO2 19* 23 20*  GLUCOSE 249* 269* 254*  BUN 16 15 15   CREATININE 0.75 0.74 0.61  CALCIUM 9.9 9.7 8.6*  MG  --   --  2.1    GFR: Estimated Creatinine Clearance: 96.7 mL/min (by C-G formula based on SCr of 0.61 mg/dL).  Liver Function Tests: No results for input(s): AST, ALT, ALKPHOS, BILITOT, PROT, ALBUMIN in the last  168 hours.  Urine analysis:    Component Value Date/Time   COLORURINE YELLOW 12/24/2020 1857   APPEARANCEUR CLEAR 12/24/2020 1857   LABSPEC 1.036 (H) 12/24/2020 1857   PHURINE 6.5 12/24/2020 1857   GLUCOSEU >1,000 (A) 12/24/2020 1857   HGBUR LARGE (A) 12/24/2020 1857   BILIRUBINUR NEGATIVE 12/24/2020 1857   KETONESUR NEGATIVE 12/24/2020 1857   PROTEINUR NEGATIVE 12/24/2020 1857   NITRITE NEGATIVE 12/24/2020 1857   LEUKOCYTESUR NEGATIVE 12/24/2020 1857    Radiological Exams on Admission: CT Angio Head W or Wo Contrast  Result Date: 12/24/2020 CLINICAL DATA:  Migraine and lightheadedness.  Syncope. EXAM: CT ANGIOGRAPHY HEAD TECHNIQUE: Multidetector CT imaging of the head was performed using the standard protocol during bolus administration of intravenous contrast. Multiplanar CT image reconstructions and MIPs were obtained to evaluate the vascular anatomy. CONTRAST:  166mL OMNIPAQUE IOHEXOL 350 MG/ML SOLN COMPARISON:  None FINDINGS: CT HEAD Brain: There is no mass, hemorrhage or extra-axial collection. The size and configuration of the ventricles and  extra-axial CSF spaces are normal. The brain parenchyma is normal, without acute or chronic infarction. Vascular: No abnormal hyperdensity of the major intracranial arteries or dural venous sinuses. No intracranial atherosclerosis. Skull: The visualized skull base, calvarium and extracranial soft tissues are normal. Sinuses/Orbits: No fluid levels or advanced mucosal thickening of the visualized paranasal sinuses. No mastoid or middle ear effusion. The orbits are normal. CTA HEAD POSTERIOR CIRCULATION: --Vertebral arteries: Normal --Inferior cerebellar arteries: Normal. --Basilar artery: Normal. --Superior cerebellar arteries: Normal. --Posterior cerebral arteries: Normal. ANTERIOR CIRCULATION: --Intracranial internal carotid arteries: Normal. --Anterior cerebral arteries (ACA): Normal. --Middle cerebral arteries (MCA): Normal. ANATOMIC VARIANTS: None Review of the MIP images confirms the above findings. IMPRESSION: Normal CT/CTA of the head. Electronically Signed   By: Ulyses Jarred M.D.   On: 12/24/2020 20:32   CT Abdomen Pelvis W Contrast  Result Date: 12/23/2020 CLINICAL DATA:  Abdominal pain with nausea and vomiting. EXAM: CT ABDOMEN AND PELVIS WITH CONTRAST TECHNIQUE: Multidetector CT imaging of the abdomen and pelvis was performed using the standard protocol following bolus administration of intravenous contrast. CONTRAST:  154mL OMNIPAQUE IOHEXOL 350 MG/ML SOLN COMPARISON:  November 05, 2020 FINDINGS: Lower chest: A stable 6 mm pleural based noncalcified lung nodule is seen along the posterolateral aspect of the right lower lobe. Hepatobiliary: There is diffuse fatty infiltration of the liver parenchyma. No focal liver abnormality is seen. No gallstones, gallbladder wall thickening, or biliary dilatation. Pancreas: Unremarkable. No pancreatic ductal dilatation or surrounding inflammatory changes. Spleen: The spleen is surgically absent. Adrenals/Urinary Tract: Adrenal glands are unremarkable. Kidneys  are normal, without obstructing renal calculi or focal lesions. A 7 mm nonobstructing renal calculus is seen within the left kidney. Mild, stable left-sided hydronephrosis is seen. Bladder is unremarkable. Stomach/Bowel: Stomach is within normal limits. Appendix appears normal. No evidence of bowel wall thickening, distention, or inflammatory changes. Vascular/Lymphatic: Mild aortic atherosclerosis. No enlarged abdominal or pelvic lymph nodes. Reproductive: Status post hysterectomy. No adnexal masses. Other: 4.1 cm x 2.7 cm and 1.8 cm x 1.7 cm fat containing ventral hernias are seen just above the level of the umbilicus. Musculoskeletal: No acute or significant osseous findings. IMPRESSION: 1. Hepatic steatosis. 2. Stable 6 mm pleural based right lower lobe lung nodule along the posterolateral aspect of the right lower lobe. Non-contrast chest CT at 6-12 months is recommended. If the nodule is stable at time of repeat CT, then future CT at 18-24 months (from today's scan) is considered optional for low-risk patients, but  is recommended for high-risk patients. This recommendation follows the consensus statement: Guidelines for Management of Incidental Pulmonary Nodules Detected on CT Images: From the Fleischner Society 2017; Radiology 2017; 284:228-243. 3. Nonobstructing left renal calculus. 4. Absent spleen. 5. Aortic atherosclerosis. Aortic Atherosclerosis (ICD10-I70.0). Electronically Signed   By: Virgina Norfolk M.D.   On: 12/23/2020 23:47    EKG: Independently reviewed.   Assessment/Plan Principal Problem:   Headache   Migraine Observation/telemetry. Continue topiramate 100 mg p.o. twice daily. Requested consult/discussed briefly with neurology. MRI brain w/wout C and MR venogram head. Continue Fioricet, Toradol and Dilaudid as needed. Continue antiemetics as needed. Continue antiviral coverage with acyclovir.  Active Problems:   Hypertension Continue lisinopril 20 mg p.o. daily. Monitor BP,  renal function electrolytes.    Syncopal episode Likely vasovagal in nature. Check echocardiogram. Consult cardiology.    Anxiety/depression Continue Lexapro 10 mg p.o. daily. Continue trazodone 50-100 mg p.o. at bedtime.    Leukocytosis Monitor WBC.    Hyperlipidemia   Aortic atherosclerosis (HCC) Discussed the risk of cardiovascular disease. Continue atorvastatin 40 mg p.o. daily.    Type 2 diabetes mellitus with hyperglycemia (HCC) Continue IV fluids. Carbohydrate modified diet. CBG monitoring with RI SS. Check hemoglobin A1c.    Hypokalemia Replacing. Magnesium supplemented last night. Follow-up potassium level.    Hepatic steatosis Advised to follow with PCP/GI. Advised to continue weight loss.    DVT prophylaxis: SCDs. Code Status:   Full code. Family Communication:   Disposition Plan:   Patient is from:  Home.  Anticipated DC to:  Home.  Anticipated DC date:  12/26/2020.  Anticipated DC barriers: Clinical status.  Consults called:   Admission status:  Observation/MedSurg.  High severity after presenting with low-grade fever and headache  Severity of Illness:  Reubin Milan MD [facility]  How to contact the [facility] Attending or Consulting provider [place] or covering provider during after hours [place], for this patient?   Check the care team in [facility] and look for a) attending/consulting [facility] provider listed and b) the [facility] team listed Log into [email or web address] and use [place]'s universal password to access. If you do not have the password, please contact the hospital operator. Locate the [facility] provider you are looking for under [facility] and page to a number that you can be directly reached. If you still have difficulty reaching the provider, please page the [facility] (Director on Call) for the Hospitalists listed on amion for assistance.  12/25/2020, 9:16 AM   This document was prepared using Dragon voice recognition software and may contain  some unintended transcription errors.

## 2020-12-25 NOTE — Consult Note (Signed)
Neurology Consultation  Reason for Consult: Headache that is different from typical migraine headache Referring Physician: Dr. Olevia Bowens  CC: Severe headache  History is obtained from: Patient, Chart review  HPI: Jamie Conley is a 45 y.o. female with a medical history significant for anxiety, depression, type 2 diabetes mellitus, hyperlipidemia, essential hypertension, remote history of meningitis and splenectomy, and migraine headaches since age 28 due to multiple presentations to the ED recently for evaluation of migraine headaches.  Patient states that she has had migraines related to allergies that she was about 45 years old but more recently has had severe stress migraines.  Patient says her current headache started on 11/13 that has been severe and unresponsive to medical management with headache cocktails.  She states that her current headache is frontal with constant stabbing pains that is 8/10 in severity with occasional loss of vision described as seeing black with associated photophobia and phonophobia.  She states her typical migraine is typically located at the top of her head and is not as severe.  She was recently evaluated in the ED and discharged with Fioricet for her headache.  She says she went home 11/17 with some improvement and was able to shower laceration out of the shower she experience a significant return of her headache.  She says she became dizzy and had to lean over a washer machine for about 5 minutes before she felt safe to walk and attempted to walk into the bedroom when she had a syncopal episode, fell backwards and hit her head on a wooden floor.  She states that she does not have any head injury from the event but does endorse back pain.  She states that she was unconscious for about 5 minutes.  Patient endorses associated nausea and vomiting yesterday with persistent nausea today.  She believes that her current headache is related to stress that she is been more  stressed recently and then she ever has before.  She also complains of some neck stiffness and tenderness without neck rigidity.  While in the ED patient had CT head imaging without acute intracranial findings.  An LP was obtained with complaints of stiff neck and headache with history of meningitis however initial results are nonconcerning for bacterial meningitis at this time.  Neurology was consulted for further evaluation and management of patient's headache.  ROS: A complete ROS was performed and is negative except as noted in the HPI.   Past Medical History:  Diagnosis Date   Anxiety    Cancer ([place])    Depression    Diabetes mellitus without complication (HCC)    High cholesterol    Hypertension    Migraine    Past Surgical History:  Procedure Laterality Date   ABDOMINAL HYSTERECTOMY     colon polyectomy     NASAL POLYP SURGERY     SPLENECTOMY     WRIST SURGERY Left    Family History  Problem Relation Age of Onset   Hypertension Mother    High Cholesterol Mother    Heart attack Father    Social History:   reports that she has never smoked. She has never used smokeless tobacco. She reports current drug use. She reports that she does not drink alcohol.  Medications  Current Facility-Administered Medications:    0.45 % NaCl with KCl 20 mEq / L infusion, , Intravenous, Continuous, Reubin Milan, MD, Last Rate: 100 mL/hr at 12/25/20 0912, New Bag at 12/25/20 0912   0.9 %  sodium chloride infusion, , Intravenous, Continuous, Trifan, Carola Rhine, MD, Last Rate: 100 mL/hr at 12/24/20 2308, New Bag at 12/24/20 2308   acetaminophen (TYLENOL) tablet 650 mg, 650 mg, Oral, Q6H PRN **OR** acetaminophen (TYLENOL) suppository 650 mg, 650 mg, Rectal, Q6H PRN, Reubin Milan, MD   acyclovir (ZOVIRAX) 690 mg in dextrose 5 % 100 mL IVPB, 10 mg/kg (Adjusted), Intravenous, Q8H, Wise, Shelby S, RPH, Last Rate: 113.8 mL/hr at 12/25/20 1126, 690 mg at 12/25/20 1126   ALPRAZolam (XANAX)  tablet 1 mg, 1 mg, Oral, Daily PRN, Reubin Milan, MD, 1 mg at 12/25/20 1125   atorvastatin (LIPITOR) tablet 40 mg, 40 mg, Oral, Daily, Reubin Milan, MD, 40 mg at 12/25/20 0849   butalbital-acetaminophen-caffeine (FIORICET) 50-325-40 MG per tablet 1 tablet, 1 tablet, Oral, Q4H PRN, Reubin Milan, MD, 1 tablet at 12/25/20 1124   escitalopram (LEXAPRO) tablet 10 mg, 10 mg, Oral, Daily, Reubin Milan, MD, 10 mg at 12/25/20 0849   HYDROmorphone (DILAUDID) injection 1 mg, 1 mg, Intravenous, Q4H PRN, Reubin Milan, MD, 1 mg at 12/25/20 0850   insulin aspart (novoLOG) injection 0-15 Units, 0-15 Units, Subcutaneous, TID WC, Reubin Milan, MD, 5 Units at 12/25/20 1126   ketorolac (TORADOL) 30 MG/ML injection 30 mg, 30 mg, Intravenous, Q6H PRN, Etta Quill, DO, 30 mg at 12/25/20 3500   lactated ringers infusion, , Intravenous, Continuous, Levonne Spiller, RPH, Last Rate: 125 mL/hr at 12/25/20 0600, Infusion Verify at 12/25/20 0600   lisinopril (ZESTRIL) tablet 20 mg, 20 mg, Oral, Daily, Reubin Milan, MD, 20 mg at 12/25/20 0849   LORazepam (ATIVAN) injection 1 mg, 1 mg, Intravenous, Once, Reubin Milan, MD   methocarbamol (ROBAXIN) tablet 500 mg, 500 mg, Oral, Q6H PRN, Reubin Milan, MD   ondansetron [facility]) tablet 4 mg, 4 mg, Oral, Q6H PRN **OR** ondansetron (ZOFRAN) injection 4 mg, 4 mg, Intravenous, Q6H PRN, Reubin Milan, MD   sodium chloride flush (NS) 0.9 % injection 3 mL, 3 mL, Intravenous, Q12H, Reubin Milan, MD, 3 mL at 12/25/20 1107   topiramate (TOPAMAX) tablet 100 mg, 100 mg, Oral, BID, Reubin Milan, MD, 100 mg at 12/25/20 9381   traZODone (DESYREL) tablet 50-100 mg, 50-100 mg, Oral, QHS PRN, Reubin Milan, MD  Exam: Current vital signs: BP 97/61 (BP Location: Left Arm)   Pulse 74   Temp 98.1 F (36.7 C) (Oral)   Resp 16   Ht 5\' 3"  (1.6 m)   Wt 93.9 kg   SpO2 98%   BMI 36.67 kg/m  Vital signs in last  24 hours: Temp:  [98.1 F (36.7 C)-99.7 F (37.6 C)] 98.1 F (36.7 C) (11/18 1007) Pulse Rate:  [74-120] 74 (11/18 1410) Resp:  [16-20] 16 (11/18 1410) BP: (97-163)/(61-103) 97/61 (11/18 1410) SpO2:  [88 %-100 %] 98 % (11/18 1410) Weight:  [93.9 kg] 93.9 kg (11/17 1723)  GENERAL: Awake, alert, in no acute distress. Patient is eating lunch initially on evaluation Psych: Affect appropriate for situation, patient is calm and cooperative with examination Head: Normocephalic and atraumatic, without obvious abnormality Neck: Full ROM present without signs of meningismus.  EENT: Normal conjunctivae, dry mucous membranes, no OP obstruction LUNGS: Normal respiratory effort. Non-labored breathing on room air CV: Extremities well perfused without pedal edema. Regular heart rate.  ABDOMEN: Soft, non-tender, non-distended MSK: Patient does have tenderness to palpation throughout cervical and thoracic spine.   NEURO:  Mental Status: Awake, alert, and  oriented to person, place, time, and situation. She is able to provide a clear and coherent history of present illness. Speech/Language: speech is intact without dysarthria.   Naming, repetition, fluency, and comprehension intact without aphasia No neglect is noted Cranial Nerves:  II: PERRL 4 mm/brisk. Visual fields full.  III, IV, VI: EOMI without ptosis, nystagmus, or gaze preference V: Patient reports decreased sensation to light touch on the left face VII: Face is symmetric resting and smiling.  VIII: Hearing is intact to voice IX, X: Palate elevation is symmetric. Phonation normal.  XI: Normal sternocleidomastoid and trapezius muscle strength XII: Tongue protrudes midline without fasciculations.   Motor: 5/5 strength is all muscle groups without vertical drift Tone is normal. Bulk is normal.  Sensation: Intact to light touch bilaterally in all four extremities. No extinction to DSS present.  Coordination: FTN intact bilaterally. HKS intact  bilaterally.   DTRs: 2+ and symmetric throughout.  Gait: Deferred  Labs I have reviewed labs in epic and the results pertinent to this consultation are: CBC    Component Value Date/Time   WBC 14.0 (H) 12/24/2020 1908   RBC 4.60 12/24/2020 1908   HGB 14.2 12/24/2020 1908   HCT 42.6 12/24/2020 1908   PLT 527 (H) 12/24/2020 1908   MCV 92.6 12/24/2020 1908   MCH 30.9 12/24/2020 1908   MCHC 33.3 12/24/2020 1908   RDW 14.4 12/24/2020 1908   LYMPHSABS 5.8 (H) 12/24/2020 1908   MONOABS 0.9 12/24/2020 1908   EOSABS 0.2 12/24/2020 1908   BASOSABS 0.1 12/24/2020 1908   CMP     Component Value Date/Time   NA 136 12/25/2020 0544   K 3.4 (L) 12/25/2020 0544   CL 107 12/25/2020 0544   CO2 20 (L) 12/25/2020 0544   GLUCOSE 254 (H) 12/25/2020 0544   BUN 15 12/25/2020 0544   CREATININE 0.61 12/25/2020 0544   CALCIUM 8.6 (L) 12/25/2020 0544   PROT 8.0 11/05/2020 1620   ALBUMIN 4.0 11/05/2020 1620   AST 16 11/05/2020 1620   ALT 15 11/05/2020 1620   ALKPHOS 77 11/05/2020 1620   BILITOT 0.5 11/05/2020 1620   GFRNONAA >60 12/25/2020 0544   Lipid Panel  No results found for: CHOL, TRIG, HDL, CHOLHDL, VLDL, LDLCALC, LDLDIRECT  Urinalysis    Component Value Date/Time   COLORURINE YELLOW 12/24/2020 1857   APPEARANCEUR CLEAR 12/24/2020 1857   LABSPEC 1.036 (H) 12/24/2020 1857   PHURINE 6.5 12/24/2020 1857   GLUCOSEU >1,000 (A) 12/24/2020 1857   HGBUR LARGE (A) 12/24/2020 1857   BILIRUBINUR NEGATIVE 12/24/2020 1857   KETONESUR NEGATIVE 12/24/2020 1857   PROTEINUR NEGATIVE 12/24/2020 1857   NITRITE NEGATIVE 12/24/2020 1857   LEUKOCYTESUR NEGATIVE 12/24/2020 1857   Drugs of Abuse     Component Value Date/Time   LABOPIA POSITIVE (A) 11/05/2020 2011   COCAINSCRNUR NONE DETECTED 11/05/2020 2011   LABBENZ NONE DETECTED 11/05/2020 2011   AMPHETMU NONE DETECTED 11/05/2020 2011   THCU POSITIVE (A) 11/05/2020 2011   LABBARB NONE DETECTED 11/05/2020 2011     Latest Reference Range &  Units 12/24/20 22:52  Appearance, CSF CLEAR  CLEAR  CLEAR CLEAR  Glucose, CSF 40 - 70 mg/dL 145 (H)  RBC Count, CSF 0 /cu mm 0 /cu mm 14 (H) 66 (H)  WBC, CSF 0 - 5 /cu mm 0 - 5 /cu mm 1 4  Other Cells, CSF  TOO FEW TO COUNT, SMEAR AVAILABLE FOR REVIEW TOO FEW TO COUNT, SMEAR AVAILABLE FOR REVIEW  Color, CSF  COLORLESS  COLORLESS  COLORLESS COLORLESS  Supernatant  NOT INDICATED NOT INDICATED  Total  Protein, CSF 15 - 45 mg/dL 60 (H)  Tube #  4 1   Gram Stain NO ORGANISMS SEEN  CYTOSPIN SMEAR  Performed at [facility], [place] [address] [place], [place] [address]   Culture PENDING   Report Status PENDING    Imaging I have reviewed the images obtained:  CT angio head with or without contrast 11/17:  Normal CT/CTA of the head.  Assessment: 45 y.o. female who presented to the ED for evaluation of ongoing severe headache that is felt to be different than her typical migraine headaches with associated nausea, vomiting, photophobia, and phonophobia that is refractory to initial medical management.  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  She does have an MRI/MRV pending, but if these are negative then I would not have any further work-up at this time.   Recommendations: - MRI brain ordered, pending - MRV ordered, pending - Follow up on pending CSF labs  - Okay to discontinue acyclovir - Avoid the use of Fioricet as frequent use may cause rebound headache  Pt seen by NP/Neuro and later by MD. Note/plan to be edited by MD as needed.  Anibal Henderson, AGAC-NP [facility] Pager: [phone]  I have seen the patient reviewed the above note. Agree with Robaxin, will schedule Compazine and Benadryl overnight tonight.  I will also give a single dose of both Depakote and magnesium.  If the above interventions are not effective, could consider DHE trial in the  morning if MRI is negative.  Roland Rack, MD [facility] [phone]  If 7pm- 7am, please page neurology on call as listed in [place].

## 2020-12-25 NOTE — Plan of Care (Signed)
CSF WBC = 1 and 4  Bacterial meningitis effectively ruled out.  Will DC Rocephin + Vanc.

## 2020-12-25 NOTE — Consult Note (Addendum)
Cardiology Consultation:   Patient ID: Jamie Conley MRN: [id number]; DOB: 06-04-1975  Admit date: 12/24/2020 Date of Consult: 12/25/2020  PCP:  Merryl Hacker, No   [facility] Providers Cardiologist:  None new   Patient Profile:   Jamie Conley is a 45 y.o. female with a hx of Migraines, DM, HTN, HLD, anxiety/depression, remote colon CA, who is being seen 12/25/2020 for the evaluation of syncope at the request of Dr Olevia Bowens.  History of Present Illness:   Jamie Conley was seen in the [place] ER for migraine on 11/16, was treated and released.  She came back to the ER on 11/17 for the same migraine, but now the symptoms were worse, with photophobia.  She reported becoming lightheaded and passing out.  She said this had never happened before.  She had an LP that was resulted today and is negative for meningitis.  Cards asked to see for the syncope.  She passed out on the 16th and on the 17th. Both were in the setting of severe migraine pain. The episodes were preceded by being light-headed and dizzy. However, she has had multiple episodes of being light-headed and dizzy without passing out.   The episode on the 17th started with a migraine, she was having pain and took a shower. As she came out of the shower, she felt light-headed and stood still for a few minutes. She felt better, but when she tried to walk into another room, she woke up on the floor.   She had really bad migraines in [place] because of allergies, she had a syncopal there due to a migraine. She was put on migraine meds including Botox, and had no more episodes, as her migraines improved.   She moved to [facility], had fewer migraines and no syncope. She felt that might be related to stress.   She moved from [facility] to [facility] and is staying with friends. She has no insurance and no MDs here.    Past Medical History:  Diagnosis Date   Anxiety    Cancer ([place])    Depression    Diabetes mellitus without complication  (HCC)    High cholesterol    Hypertension    Migraine     Past Surgical History:  Procedure Laterality Date   ABDOMINAL HYSTERECTOMY     colon polyectomy     NASAL POLYP SURGERY     SPLENECTOMY     WRIST SURGERY Left      Home Medications:  Prior to Admission medications   Medication Sig Start Date End Date Taking? Authorizing Provider  ALPRAZolam Duanne Moron) 1 MG tablet Take 1 mg by mouth daily as needed for anxiety.   Yes [provider]  atorvastatin (LIPITOR) 40 MG tablet Take 40 mg by mouth daily.   Yes [provider]  escitalopram (LEXAPRO) 10 MG tablet Take 10 mg by mouth daily. 12/11/20  Yes [provider]  lisinopril (ZESTRIL) 20 MG tablet Take 20 mg by mouth daily.   Yes [provider]  metFORMIN (GLUCOPHAGE) 1000 MG tablet Take 1,000 mg by mouth 2 (two) times daily with a meal.   Yes [provider]  topiramate (TOPAMAX) 100 MG tablet Take 100 mg by mouth 2 (two) times daily.   Yes [provider]  traZODone (DESYREL) 50 MG tablet Take 50-100 mg by mouth at bedtime as needed for sleep.   Yes [provider]  butalbital-acetaminophen-caffeine (FIORICET) 50-325-40 MG tablet Take 1-2 tablets by mouth every 6 (six)  hours as needed for headache. 12/24/20 12/24/21  Mesner, Corene Cornea, MD    Inpatient Medications: Scheduled Meds:  atorvastatin  40 mg Oral Daily   escitalopram  10 mg Oral Daily   insulin aspart  0-15 Units Subcutaneous TID WC   lisinopril  20 mg Oral Daily   sodium chloride flush  3 mL Intravenous Q12H   topiramate  100 mg Oral BID   Continuous Infusions:  0.45 % NaCl with KCl 20 mEq / L 100 mL/hr at 12/25/20 0912   sodium chloride 100 mL/hr at 12/24/20 2308   acyclovir 690 mg (12/25/20 1126)   lactated ringers 125 mL/hr at 12/25/20 0600   PRN Meds: acetaminophen **OR** acetaminophen, ALPRAZolam, butalbital-acetaminophen-caffeine, HYDROmorphone (DILAUDID) injection, ketorolac, methocarbamol,  ondansetron **OR** ondansetron (ZOFRAN) IV, traZODone  Allergies:   No Known Allergies  Social History:   Social History   Socioeconomic History   Marital status: Single    Spouse name: Not on file   Number of children: Not on file   Years of education: Not on file   Highest education level: Not on file  Occupational History   Not on file  Tobacco Use   Smoking status: Never   Smokeless tobacco: Never  Vaping Use   Vaping Use: Never used  Substance and Sexual Activity   Alcohol use: Never   Drug use: Yes    Comment: Delta 8 gummies   Sexual activity: Not on file  Other Topics Concern   Not on file  Social History Narrative   Not on file   Social Determinants of Health   Financial Resource Strain: Not on file  Food Insecurity: Not on file  Transportation Needs: Not on file  Physical Activity: Not on file  Stress: Not on file  Social Connections: Not on file  Intimate Partner Violence: Not on file    Family History:   Family History  Problem Relation Age of Onset   Hypertension Mother    High Cholesterol Mother    Heart attack Father      ROS:  Please see the history of present illness.  All other ROS reviewed and negative.     Physical Exam/Data:   Vitals:   12/24/20 2115 12/25/20 0233 12/25/20 0610 12/25/20 1007  BP: (!) 115/103 (!) 136/93 (!) 128/95 (!) 163/95  Pulse: 96 81 86 86  Resp: 18  18 18   Temp:  98.4 F (36.9 C) 98.2 F (36.8 C) 98.1 F (36.7 C)  TempSrc:  Oral Oral Oral  SpO2: 100% 99% 98% 97%  Weight:      Height:        Intake/Output Summary (Last 24 hours) at 12/25/2020 1145 Last data filed at 12/25/2020 1000 Gross per 24 hour  Intake 971.62 ml  Output 300 ml  Net 671.62 ml   Last 3 Weights 12/24/2020 12/23/2020 11/05/2020  Weight (lbs) 207 lb 0.2 oz 207 lb 212 lb  Weight (kg) 93.9 kg 93.895 kg 96.163 kg     Body mass index is 36.67 kg/m.  General:  Well nourished, well developed, in no acute distress HEENT: normal Neck:  no JVD Vascular: No carotid bruits; Distal pulses 2+ bilaterally Cardiac:  normal S1, S2; RRR; no murmur  Lungs:  clear to auscultation bilaterally, no wheezing, rhonchi or rales  Abd: soft, nontender, no hepatomegaly  Ext: no edema Musculoskeletal:  No deformities, BUE and BLE strength normal and equal Skin: warm and dry  Neuro:  CNs 2-12 intact, no focal abnormalities noted Psych:  Normal affect   EKG:  The EKG was personally reviewed and demonstrates: Sinus rhythm, heart rate 82, minor T wave abnormalities of unclear significance Telemetry:  Telemetry was personally reviewed and demonstrates: Sinus rhythm  Relevant CV Studies:  ECHO: Ordered  Laboratory Data:  High Sensitivity Troponin:  No results for input(s): TROPONINIHS in the last 720 hours.   Chemistry Recent Labs  Lab 12/23/20 2138 12/24/20 1908 12/25/20 0544  NA 138 138 136  K 3.6 3.7 3.4*  CL 106 103 107  CO2 19* 23 20*  GLUCOSE 249* 269* 254*  BUN 16 15 15   CREATININE 0.75 0.74 0.61  CALCIUM 9.9 9.7 8.6*  MG  --   --  2.1  GFRNONAA >60 >60 >60  ANIONGAP 13 12 9     No results for input(s): PROT, ALBUMIN, AST, ALT, ALKPHOS, BILITOT in the last 168 hours. Lipids No results for input(s): CHOL, TRIG, HDL, LABVLDL, LDLCALC, CHOLHDL in the last 168 hours.  Hematology Recent Labs  Lab 12/23/20 2138 12/24/20 1908  WBC 16.2* 14.0*  RBC 4.56 4.60  HGB 14.2 14.2  HCT 41.0 42.6  MCV 89.9 92.6  MCH 31.1 30.9  MCHC 34.6 33.3  RDW 14.0 14.4  PLT 547* 527*   Thyroid No results for input(s): TSH, FREET4 in the last 168 hours.  BNPNo results for input(s): BNP, PROBNP in the last 168 hours.  DDimer No results for input(s): DDIMER in the last 168 hours.   Radiology/Studies:  CT Angio Head W or Wo Contrast  Result Date: 12/24/2020 CLINICAL DATA:  Migraine and lightheadedness.  Syncope. EXAM: CT ANGIOGRAPHY HEAD TECHNIQUE: Multidetector CT imaging of the head was performed using the standard protocol during  bolus administration of intravenous contrast. Multiplanar CT image reconstructions and MIPs were obtained to evaluate the vascular anatomy. CONTRAST:  174mL OMNIPAQUE IOHEXOL 350 MG/ML SOLN COMPARISON:  None FINDINGS: CT HEAD Brain: There is no mass, hemorrhage or extra-axial collection. The size and configuration of the ventricles and extra-axial CSF spaces are normal. The brain parenchyma is normal, without acute or chronic infarction. Vascular: No abnormal hyperdensity of the major intracranial arteries or dural venous sinuses. No intracranial atherosclerosis. Skull: The visualized skull base, calvarium and extracranial soft tissues are normal. Sinuses/Orbits: No fluid levels or advanced mucosal thickening of the visualized paranasal sinuses. No mastoid or middle ear effusion. The orbits are normal. CTA HEAD POSTERIOR CIRCULATION: --Vertebral arteries: Normal --Inferior cerebellar arteries: Normal. --Basilar artery: Normal. --Superior cerebellar arteries: Normal. --Posterior cerebral arteries: Normal. ANTERIOR CIRCULATION: --Intracranial internal carotid arteries: Normal. --Anterior cerebral arteries (ACA): Normal. --Middle cerebral arteries (MCA): Normal. ANATOMIC VARIANTS: None Review of the MIP images confirms the above findings. IMPRESSION: Normal CT/CTA of the head. Electronically Signed   By: Ulyses Jarred M.D.   On: 12/24/2020 20:32   CT Abdomen Pelvis W Contrast  Result Date: 12/23/2020 CLINICAL DATA:  Abdominal pain with nausea and vomiting. EXAM: CT ABDOMEN AND PELVIS WITH CONTRAST TECHNIQUE: Multidetector CT imaging of the abdomen and pelvis was performed using the standard protocol following bolus administration of intravenous contrast. CONTRAST:  131mL OMNIPAQUE IOHEXOL 350 MG/ML SOLN COMPARISON:  November 05, 2020 FINDINGS: Lower chest: A stable 6 mm pleural based noncalcified lung nodule is seen along the posterolateral aspect of the right lower lobe. Hepatobiliary: There is diffuse fatty  infiltration of the liver parenchyma. No focal liver abnormality is seen. No gallstones, gallbladder wall thickening, or biliary dilatation. Pancreas: Unremarkable. No pancreatic ductal dilatation or surrounding inflammatory changes. Spleen: The spleen  is surgically absent. Adrenals/Urinary Tract: Adrenal glands are unremarkable. Kidneys are normal, without obstructing renal calculi or focal lesions. A 7 mm nonobstructing renal calculus is seen within the left kidney. Mild, stable left-sided hydronephrosis is seen. Bladder is unremarkable. Stomach/Bowel: Stomach is within normal limits. Appendix appears normal. No evidence of bowel wall thickening, distention, or inflammatory changes. Vascular/Lymphatic: Mild aortic atherosclerosis. No enlarged abdominal or pelvic lymph nodes. Reproductive: Status post hysterectomy. No adnexal masses. Other: 4.1 cm x 2.7 cm and 1.8 cm x 1.7 cm fat containing ventral hernias are seen just above the level of the umbilicus. Musculoskeletal: No acute or significant osseous findings. IMPRESSION: 1. Hepatic steatosis. 2. Stable 6 mm pleural based right lower lobe lung nodule along the posterolateral aspect of the right lower lobe. Non-contrast chest CT at 6-12 months is recommended. If the nodule is stable at time of repeat CT, then future CT at 18-24 months (from today's scan) is considered optional for low-risk patients, but is recommended for high-risk patients. This recommendation follows the consensus statement: Guidelines for Management of Incidental Pulmonary Nodules Detected on CT Images: From the Fleischner Society 2017; Radiology 2017; 284:228-243. 3. Nonobstructing left renal calculus. 4. Absent spleen. 5. Aortic atherosclerosis. Aortic Atherosclerosis (ICD10-I70.0). Electronically Signed   By: Virgina Norfolk M.D.   On: 12/23/2020 23:47   Portable Chest 1 View  Result Date: 12/25/2020 CLINICAL DATA:  Syncope. EXAM: PORTABLE CHEST 1 VIEW COMPARISON:  None. FINDINGS: The  heart size and mediastinal contours are within normal limits. Both lungs are clear. The visualized skeletal structures are unremarkable. IMPRESSION: No active disease. Electronically Signed   By: Titus Dubin M.D.   On: 12/25/2020 10:58     Assessment and Plan:   Syncope - she has had 3 episodes in her life, all associated with a severe migraine.  - history of being light-headed and dizzy without passing out, also from migraines. - no history of presyncope or syncope otherwise - no palpitaitons - no chest pain - if echo is normal, no further eval.   Risk Assessment/Risk Scores:      For questions or updates, please contact [facility] Please consult [email or web address] for contact info under    Signed, Rosaria Ferries, PA-C  12/25/2020 11:45 AM

## 2020-12-26 DIAGNOSIS — E785 Hyperlipidemia, unspecified: Secondary | ICD-10-CM

## 2020-12-26 DIAGNOSIS — E1165 Type 2 diabetes mellitus with hyperglycemia: Secondary | ICD-10-CM

## 2020-12-26 DIAGNOSIS — F32A Depression, unspecified: Secondary | ICD-10-CM

## 2020-12-26 DIAGNOSIS — I10 Essential (primary) hypertension: Secondary | ICD-10-CM

## 2020-12-26 DIAGNOSIS — F419 Anxiety disorder, unspecified: Secondary | ICD-10-CM

## 2020-12-26 LAB — CBC
HCT: 39.1 % (ref 36.0–46.0)
Hemoglobin: 13 g/dL (ref 12.0–15.0)
MCH: 31 pg (ref 26.0–34.0)
MCHC: 33.2 g/dL (ref 30.0–36.0)
MCV: 93.3 fL (ref 80.0–100.0)
Platelets: 456 10*3/uL — ABNORMAL HIGH (ref 150–400)
RBC: 4.19 MIL/uL (ref 3.87–5.11)
RDW: 14.5 % (ref 11.5–15.5)
WBC: 16.6 10*3/uL — ABNORMAL HIGH (ref 4.0–10.5)
nRBC: 0 % (ref 0.0–0.2)

## 2020-12-26 LAB — GLUCOSE, CAPILLARY
Glucose-Capillary: 222 mg/dL — ABNORMAL HIGH (ref 70–99)
Glucose-Capillary: 222 mg/dL — ABNORMAL HIGH (ref 70–99)
Glucose-Capillary: 215 mg/dL — ABNORMAL HIGH (ref 70–99)
Glucose-Capillary: 178 mg/dL — ABNORMAL HIGH (ref 70–99)

## 2020-12-26 LAB — BASIC METABOLIC PANEL
Anion gap: 9 (ref 5–15)
BUN: 13 mg/dL (ref 6–20)
CO2: 19 mmol/L — ABNORMAL LOW (ref 22–32)
Calcium: 8.7 mg/dL — ABNORMAL LOW (ref 8.9–10.3)
Chloride: 109 mmol/L (ref 98–111)
Creatinine, Ser: 0.61 mg/dL (ref 0.44–1.00)
GFR, Estimated: >60 mL/min (ref >60)
Glucose, Bld: 249 mg/dL — ABNORMAL HIGH (ref 70–99)
Potassium: 3.4 mmol/L — ABNORMAL LOW (ref 3.5–5.1)
Sodium: 137 mmol/L (ref 135–145)

## 2020-12-26 LAB — URINE CULTURE: Culture: 40000 — AB

## 2020-12-26 LAB — HIV ANTIBODY (ROUTINE TESTING W REFLEX): HIV Screen 4th Generation wRfx: NONREACTIVE

## 2020-12-26 MED ORDER — METOCLOPRAMIDE HCL 5 MG/ML IJ SOLN
10.0000 mg | Freq: Once | INTRAMUSCULAR | Status: AC
  Administered 2020-12-26: 10 mg via INTRAVENOUS
  Filled 2020-12-26: qty 2

## 2020-12-26 MED ORDER — LOPERAMIDE HCL 2 MG PO CAPS
2.0000 mg | ORAL_CAPSULE | ORAL | Status: DC | PRN
  Administered 2020-12-26 – 2020-12-27 (×8): 2 mg via ORAL
  Filled 2020-12-26 (×8): qty 1

## 2020-12-26 MED ORDER — DIHYDROERGOTAMINE MESYLATE 1 MG/ML IJ SOLN
0.7500 mg | Freq: Three times a day (TID) | INTRAMUSCULAR | Status: AC
  Administered 2020-12-26 – 2020-12-27 (×3): 0.8 mg via INTRAVENOUS
  Filled 2020-12-26 (×3): qty 0.8

## 2020-12-26 MED ORDER — POTASSIUM CHLORIDE CRYS ER 20 MEQ PO TBCR
40.0000 meq | EXTENDED_RELEASE_TABLET | Freq: Once | ORAL | Status: AC
  Administered 2020-12-26: 40 meq via ORAL
  Filled 2020-12-26: qty 2

## 2020-12-26 MED ORDER — METOCLOPRAMIDE HCL 5 MG/ML IJ SOLN: 10.0000 mg | Freq: Once | INTRAMUSCULAR | Status: DC

## 2020-12-26 MED ORDER — METOCLOPRAMIDE HCL 5 MG/ML IJ SOLN
10.0000 mg | Freq: Three times a day (TID) | INTRAMUSCULAR | Status: AC
  Administered 2020-12-26 – 2020-12-27 (×2): 10 mg via INTRAVENOUS
  Filled 2020-12-26 (×2): qty 2

## 2020-12-26 NOTE — Progress Notes (Signed)
PROGRESS NOTE    Kourtnee Lahey  HTD:[id number] DOB: December 06, 1975 DOA: 12/24/2020 PCP: Pcp, No   Brief Narrative:  Patient is a 45 year old obese Caucasian female with a past medical history significant for but not limited to anxiety, depression, history of cancer, diabetes mellitus type 2, hyperlipidemia, hypertension as well as history of migraine headaches who came to the emergency department yesterday evening for the second time the last 3 days due to intractable headache associated with photophobia, nausea, lightheadedness and neck stiffness.  She underwent an LP in the ED and was ruled out for bacterial meningitis.  She was placed on IV acyclovir but this was discontinued by neurology.  She used to live in [facility] but moved here due to allergies that were making her migraine headaches worse.  She has been having a lot of headaches here but worse over the last few days.  She states that she normally gets headache the top of her head and it felt like pressure on both temples.  She usually takes a preventative but she states has not been helping with the Topamax.  She denies fevers or chills and no rhinorrhea or wheezing or hemoptysis.  She states that she had been feeling some chest pressure, dyspnea and palpitations which she attributed to her panic attack.  Subsequently she also associates to syncopal episodes related to her migraine headaches.  She denies any abdominal pain dysuria or hematuria.  In the ED she received a 500 mL bolus and was started on acyclovir and given vancomycin and ceftriaxone given concern for meningitis.  Her antibacterials were discontinued after CSF cell count came back.  She was also given diphenhydramine, lorazepam and metoclopramide as well as magnesium sulfate, ketorolac and hydromorphone for headache with minimal improvement.  Neurology and cardiology were both consulted.  Cardiology was consulted for syncopal episode and felt that she had presyncope in the  setting of severe migraine likely vagal response given that her hemodynamics were normal with no cardiac symptoms.  She had an echocardiogram done which showed a normal EF but did show grade 2 diastolic dysfunction.  Neurology was consulted for intractable headache and worked her up and she underwent an MRI and MRV which were both normal.  Neurology is now starting the patient on the DHE trial.  Assessment & Plan:   Principal Problem:   Headache Active Problems:   Depression   Hypertension   Migraine   Anxiety   Leukocytosis   Hyperlipidemia   Type 2 diabetes mellitus with hyperglycemia (HCC)   Hypokalemia   Syncopal episodes   Hepatic steatosis   Aortic atherosclerosis (HCC)  Acute syncopal episode in the setting of migraine -Has had syncopal episodes in her past associated severe migraines and likely vasovagal in nature -She has a history of being lightheaded and dizzy without passing out from migraines as well -She denies palpitations or chest pain - echocardiogram was ordered and showed a normal EF but did show grade 2 diastolic dysfunction -Cardiology recommends no further work-up and has signed off the case  Intractable headache likely migraine and/status migrainosus -Initially there was concern for bacterial meningitis given that she had stiff neck and headache with elevated WBC -She had LP done which ruled out bacterial meningitis and so her antibiotics were stopped.  Neurology also felt that we could stop the acyclovir -Neurology ordered an MRI and MRV which were both normal -They feel that her headache is related to stress -Patient also underwent a CTA of the head with  and without contrast which was normal and showed no abnormal vasculature -Neurology recommending avoiding the use of Fioricet etc. can cause rebound headaches -Patient was given Robaxin and scheduled Compazine and Benadryl overnight and was also given a dose of both Depakote and magnesium -Given the her  headache is only minimally improved neurology started on DHE trial now that her MRI is negative -She is placed in telemetry and changed to inpatient -She is being continued on topiramate 100 g p.o. twice daily -Terance Hart has been discontinued -Continue with antiemetics as needed -Currently getting fluid hydration with half-normal saline +20 mg of KCl at 100 MLS per hour but will reduce to 75 MLS per hour -Further care per neurology  Hypertension -Continue with lisinopril 20 mg p.o. daily -Continue to monitor blood pressures per protocol -Last blood pressure reading was  Leukocytosis -Likely reactive and worsened in the setting of steroids -WBC trended up and went from 14.0 and trended up to 16.6 -Continue monitor and trend and repeat CBC in a.m. -Antibiotics have been discontinued  Thrombocytosis -Likely reactive -Patient's platelet count went from 547 and trended down to 527 is now 456 -Continue to monitor and trend and repeat CBC in the a.m.  Uncontrolled Diabetes Mellitus Type 2 -Hemoglobin A1c was 11.4 -Placed on a carb modified diet -Continue with NovoLog/scale insulin -CBGs ranging from 170-222  Anxiety and depression -Continue with escitalopram 10 mg p.o. daily and continue trazodone 50 to 100 mg p.o. nightly -Given Valium for her MRI yesterday and she is being continued on alprazolam 1 mg p.o. daily as needed anxiety  Hepatic steatosis -She was advised to follow-up with her PCP and GI and advised to continue weight loss  Hyperlipidemia -Continue with atorvastatin 40 g daily  Hypokalemia.   -Mild at 3.4 and will replete -Check magnesium level -Continue monitor and trend and repeat CMP in a.m.  Obesity -Complicates overall prognosis and care -Estimated body mass index is 36.67 kg/m as calculated from the following:   Height as of this encounter: 5\' 3"  (1.6 m).   Weight as of this encounter: 93.9 kg. -Weight Loss and Dietary Counseling given  DVT prophylaxis:  SCDs Code Status: FULL CODE  Family Communication: No family present at bedside Disposition Plan: Pending further clinical improvement and clearance by specialists  Status is: Inpatient  Remains inpatient appropriate because: She continues have an intractable headache that is minimally improved  Consultants:  Neurology Cardiology  Procedures:  MRI, MRV, ECHO  Antimicrobials:  Anti-infectives (From admission, onward)    Start     Dose/Rate Route Frequency Ordered Stop   12/25/20 0400  acyclovir (ZOVIRAX) 690 mg in dextrose 5 % 100 mL IVPB  Status:  Discontinued        10 mg/kg  69 kg (Adjusted) 113.8 mL/hr over 60 Minutes Intravenous Every 8 hours 12/24/20 2335 12/25/20 1700   12/25/20 0000  vancomycin (VANCOCIN) IVPB 1000 mg/200 mL premix  Status:  Discontinued       See Hyperspace for full Linked Orders Report.   1,000 mg 200 mL/hr over 60 Minutes Intravenous  Once 12/24/20 2257 12/25/20 0821   12/24/20 2300  vancomycin (VANCOCIN) IVPB 1000 mg/200 mL premix       See Hyperspace for full Linked Orders Report.   1,000 mg 200 mL/hr over 60 Minutes Intravenous  Once 12/24/20 2257 12/25/20 0132   12/24/20 2300  cefTRIAXone (ROCEPHIN) 2 g in sodium chloride 0.9 % 100 mL IVPB  Status:  Discontinued  2 g 200 mL/hr over 30 Minutes Intravenous Every 12 hours 12/24/20 2257 12/25/20 0238   12/24/20 2245  cefTRIAXone (ROCEPHIN) 2 g in sodium chloride 0.9 % 100 mL IVPB  Status:  Discontinued        2 g 200 mL/hr over 30 Minutes Intravenous  Once 12/24/20 2238 12/25/20 0751        Subjective: Seen and examined at bedside she continues to still have some headache and states is very minimally improved.  States that she gets nauseous and states that she is not much better.  States that she has passed out from her headache.  Also states that she is under a lot of stress.  No other concerns or complaints this time.  Objective: Vitals:   12/26/20 1021 12/26/20 1241 12/26/20 1311  12/26/20 1430  BP: 104/67   126/84  Pulse: 73   88  Resp: 14 20 16 16   Temp: 98.6 F (37 C)   97.9 F (36.6 C)  TempSrc: Oral   Oral  SpO2: 97%   99%  Weight:      Height:        Intake/Output Summary (Last 24 hours) at 12/26/2020 1816 Last data filed at 12/26/2020 0436 Gross per 24 hour  Intake 3265.92 ml  Output --  Net 3265.92 ml   Filed Weights   12/24/20 1723  Weight: 93.9 kg   Examination: Physical Exam:  Constitutional: WN/WD obese Caucasian female currently in no acute distress appears comfortable Eyes: Lids and conjunctivae normal, sclerae anicteric  ENMT: External Ears, Nose appear normal. Grossly normal hearing. Mucous membranes are moist.  Neck: Appears normal, supple, no cervical masses, normal ROM, no appreciable thyromegaly; no appreciable JVD Respiratory: Diminished to auscultation bilaterally, no wheezing, rales, rhonchi or crackles. Normal respiratory effort and patient is not tachypenic. No accessory muscle use.  Unlabored breathing Cardiovascular: RRR, no murmurs / rubs / gallops. S1 and S2 auscultated. No extremity edema.  Abdomen: Soft, non-tender, distended secondary by habitus. Bowel sounds positive.  GU: Deferred. Musculoskeletal: No clubbing / cyanosis of digits/nails. No joint deformity upper and lower extremities.  Skin: No rashes, lesions, ulcers on limited skin evaluation. No induration; Warm and dry.  Neurologic: CN 2-12 grossly intact with no focal deficits. Romberg sign and cerebellar reflexes not assessed.  Psychiatric: Normal judgment and insight. Alert and oriented x 3.  Slightly anxious mood and appropriate affect.   Data Reviewed: I have personally reviewed following labs and imaging studies  CBC: Recent Labs  Lab 12/23/20 2138 12/24/20 1908 12/26/20 0457  WBC 16.2* 14.0* 16.6*  NEUTROABS 8.3* 6.9  --   HGB 14.2 14.2 13.0  HCT 41.0 42.6 39.1  MCV 89.9 92.6 93.3  PLT 547* 527* 185*   Basic Metabolic Panel: Recent Labs  Lab  12/23/20 2138 12/24/20 1908 12/25/20 0544 12/26/20 0457  NA 138 138 136 137  K 3.6 3.7 3.4* 3.4*  CL 106 103 107 109  CO2 19* 23 20* 19*  GLUCOSE 249* 269* 254* 249*  BUN 16 15 15 13   CREATININE 0.75 0.74 0.61 0.61  CALCIUM 9.9 9.7 8.6* 8.7*  MG  --   --  2.1  --    GFR: Estimated Creatinine Clearance: 96.7 mL/min (by C-G formula based on SCr of 0.61 mg/dL). Liver Function Tests: No results for input(s): AST, ALT, ALKPHOS, BILITOT, PROT, ALBUMIN in the last 168 hours. No results for input(s): LIPASE, AMYLASE in the last 168 hours. No results for input(s): AMMONIA in the  last 168 hours. Coagulation Profile: No results for input(s): INR, PROTIME in the last 168 hours. Cardiac Enzymes: No results for input(s): CKTOTAL, CKMB, CKMBINDEX, TROPONINI in the last 168 hours. BNP (last 3 results) No results for input(s): PROBNP in the last 8760 hours. HbA1C: Recent Labs    12/25/20 0544  HGBA1C 11.4*   CBG: Recent Labs  Lab 12/25/20 1652 12/25/20 2039 12/26/20 0834 12/26/20 1152 12/26/20 1749  GLUCAP 222* 170* 222* 222* 215*   Lipid Profile: No results for input(s): CHOL, HDL, LDLCALC, TRIG, CHOLHDL, LDLDIRECT in the last 72 hours. Thyroid Function Tests: No results for input(s): TSH, T4TOTAL, FREET4, T3FREE, THYROIDAB in the last 72 hours. Anemia Panel: No results for input(s): VITAMINB12, FOLATE, FERRITIN, TIBC, IRON, RETICCTPCT in the last 72 hours. Sepsis Labs: No results for input(s): PROCALCITON, LATICACIDVEN in the last 168 hours.  Recent Results (from the past 240 hour(s))  Urine Culture     Status: Abnormal   Collection Time: 12/23/20  9:07 PM   Specimen: Urine, Clean Catch  Result Value Ref Range Status   Specimen Description   Final    URINE, CLEAN CATCH Performed at [facility], [address], [place], [place] [address]    Special Requests   Final    NONE Performed at [facility], [place], [place] [address]    Culture (A)  Final    40,000 COLONIES/mL STAPHYLOCOCCUS AUREUS 40,000 COLONIES/mL STREPTOCOCCUS AGALACTIAE TESTING AGAINST S. AGALACTIAE NOT ROUTINELY PERFORMED DUE TO PREDICTABILITY OF AMP/PEN/VAN SUSCEPTIBILITY. Performed at [facility], [place] [address], [place], [place] [address]    Report Status 12/26/2020 FINAL  Final   Organism ID, Bacteria STAPHYLOCOCCUS AUREUS (A)  Final      Susceptibility   Staphylococcus aureus - MIC*    CIPROFLOXACIN <=0.5 SENSITIVE Sensitive     GENTAMICIN <=0.5 SENSITIVE Sensitive     NITROFURANTOIN <=16 SENSITIVE Sensitive     OXACILLIN 0.5 SENSITIVE Sensitive     TETRACYCLINE <=1 SENSITIVE Sensitive     VANCOMYCIN <=0.5 SENSITIVE Sensitive     TRIMETH/SULFA <=10 SENSITIVE Sensitive     CLINDAMYCIN <=0.25 SENSITIVE Sensitive     RIFAMPIN <=0.5 SENSITIVE Sensitive     Inducible Clindamycin NEGATIVE Sensitive     * 40,000 COLONIES/mL STAPHYLOCOCCUS AUREUS  Wet prep, genital     Status: None   Collection Time: 12/23/20  9:10 PM  Result Value Ref Range Status   Yeast Wet Prep HPF POC NONE SEEN NONE SEEN Final   Trich, Wet Prep NONE SEEN NONE SEEN Final   Clue Cells Wet Prep HPF POC NONE SEEN NONE SEEN Final   WBC, Wet Prep HPF POC <10 <10 Final    Comment: Please note change in reference range.   Sperm NONE SEEN  Final    Comment: Performed at [facility], [address] [place], [place] [address]  Resp Panel by RT-PCR (Flu A&B, Covid) Nasopharyngeal Swab     Status: None   Collection Time: 12/24/20  7:08 PM   Specimen: Nasopharyngeal Swab; Nasopharyngeal(NP) swabs in vial transport medium  Result Value Ref Range Status   SARS Coronavirus 2 by RT PCR NEGATIVE NEGATIVE Final    Comment: (NOTE) SARS-CoV-2 target nucleic acids are NOT DETECTED.  The SARS-CoV-2 RNA is generally detectable in upper respiratory specimens during the acute phase of infection. The lowest concentration of SARS-CoV-2 viral  copies this assay can detect is 138 copies/mL. A negative result does not preclude  SARS-Cov-2 infection and should not be used as the sole basis for treatment or other patient management decisions. A negative result may occur with  improper specimen collection/handling, submission of specimen other than nasopharyngeal swab, presence of viral mutation(s) within the areas targeted by this assay, and inadequate number of viral copies(<138 copies/mL). A negative result must be combined with clinical observations, patient history, and epidemiological information. The expected result is Negative.  Fact Sheet for Patients:  EntrepreneurPulse.com.au  Fact Sheet for Healthcare Providers:  IncredibleEmployment.be  This test is no t yet approved or cleared by the [place] FDA and  has been authorized for detection and/or diagnosis of SARS-CoV-2 by FDA under an Emergency Use Authorization (EUA). This EUA will remain  in effect (meaning this test can be used) for the duration of the COVID-19 declaration under Section 564(b)(1) of the Act, 21 U.S.C.section 360bbb-3(b)(1), unless the authorization is terminated  or revoked sooner.       Influenza A by PCR NEGATIVE NEGATIVE Final   Influenza B by PCR NEGATIVE NEGATIVE Final    Comment: (NOTE) The Xpert Xpress SARS-CoV-2/FLU/RSV plus assay is intended as an aid in the diagnosis of influenza from Nasopharyngeal swab specimens and should not be used as a sole basis for treatment. Nasal washings and aspirates are unacceptable for Xpert Xpress SARS-CoV-2/FLU/RSV testing.  Fact Sheet for Patients: EntrepreneurPulse.com.au  Fact Sheet for Healthcare Providers: IncredibleEmployment.be  This test is not yet approved or cleared by the [place] FDA and has been authorized for detection and/or diagnosis of SARS-CoV-2 by FDA under an Emergency Use Authorization (EUA). This  EUA will remain in effect (meaning this test can be used) for the duration of the COVID-19 declaration under Section 564(b)(1) of the Act, 21 U.S.C. section 360bbb-3(b)(1), unless the authorization is terminated or revoked.  Performed at [facility], [address] [place], [place] [address]   CSF culture w Gram Stain     Status: None (Preliminary result)   Collection Time: 12/24/20 10:52 PM   Specimen: Lumbar Puncture; Cerebrospinal Fluid  Result Value Ref Range Status   Specimen Description   Final    LUMBAR Performed at [facility], [address] [place], [place] [address]    Special Requests   Final    NONE Performed at [facility], [place], [place] [address]    Gram Stain NO ORGANISMS SEEN CYTOSPIN SMEAR   Final   Culture   Final    NO GROWTH 1 DAY Performed at [facility], [place] [address], [place], [place] [address]    Report Status PENDING  Incomplete  Anaerobic culture w Gram Stain     Status: None (Preliminary result)   Collection Time: 12/24/20 10:52 PM   Specimen: Lumbar Puncture; Cerebrospinal Fluid  Result Value Ref Range Status   Specimen Description   Final    LUMBAR Performed at [facility], [address] [place], [place] [address]    Special Requests   Final    NONE Performed at [facility], [place],  [address]    Gram Stain   Final    NO ORGANISMS SEEN CYTOSPIN SMEAR Performed at [facility], [place] [address], [place],  [address]    Culture   Final    NO ANAEROBES ISOLATED; CULTURE IN PROGRESS FOR 5 DAYS   Report Status PENDING  Incomplete  Culture, blood (routine x 2)     Status: None (  Preliminary result)   Collection Time: 12/25/20  3:58 AM   Specimen: BLOOD LEFT HAND  Result Value Ref Range Status   Specimen Description   Final    BLOOD LEFT HAND Performed at [facility] [address], [place], [place] [address]    Special Requests   Final    BOTTLES DRAWN AEROBIC ONLY Blood Culture results may not be optimal due to an inadequate volume of blood received in culture bottles Performed at [facility] [address] [place], [place] [address]    Culture   Final    NO GROWTH 1 DAY Performed at [facility], [place] [address], [place], [place] [address]    Report Status PENDING  Incomplete  Culture, blood (routine x 2)     Status: None (Preliminary result)   Collection Time: 12/25/20  3:58 AM   Specimen: BLOOD LEFT ARM  Result Value Ref Range Status   Specimen Description   Final    BLOOD LEFT ARM Performed at [facility] [address] [place], [place] [address]    Special Requests   Final    BOTTLES DRAWN AEROBIC ONLY Blood Culture results may not be optimal due to an inadequate volume of blood received in culture bottles Performed at [facility] [address] [place], [place] [address]    Culture   Final    NO GROWTH 1 DAY Performed at [facility], [place] [address] [place], [place] [address]    Report Status PENDING  Incomplete    RN Pressure Injury Documentation:     Estimated body mass index is 36.67 kg/m as calculated from the following:   Height as of this encounter: 5\' 3"  (1.6 m).   Weight as of this encounter: 93.9 kg.  Malnutrition Type:   Malnutrition Characteristics:   Nutrition Interventions:    Radiology Studies: CT Angio Head W or Wo Contrast  Result Date: 12/24/2020 CLINICAL DATA:  Migraine and lightheadedness.  Syncope. EXAM: CT ANGIOGRAPHY HEAD TECHNIQUE: Multidetector CT imaging of the head was performed using the standard protocol during bolus administration of intravenous contrast. Multiplanar CT image reconstructions and MIPs were obtained to evaluate the vascular anatomy. CONTRAST:  150mL OMNIPAQUE IOHEXOL 350 MG/ML SOLN  COMPARISON:  None FINDINGS: CT HEAD Brain: There is no mass, hemorrhage or extra-axial collection. The size and configuration of the ventricles and extra-axial CSF spaces are normal. The brain parenchyma is normal, without acute or chronic infarction. Vascular: No abnormal hyperdensity of the major intracranial arteries or dural venous sinuses. No intracranial atherosclerosis. Skull: The visualized skull base, calvarium and extracranial soft tissues are normal. Sinuses/Orbits: No fluid levels or advanced mucosal thickening of the visualized paranasal sinuses. No mastoid or middle ear effusion. The orbits are normal. CTA HEAD POSTERIOR CIRCULATION: --Vertebral arteries: Normal --Inferior cerebellar arteries: Normal. --Basilar artery: Normal. --Superior cerebellar arteries: Normal. --Posterior cerebral arteries: Normal. ANTERIOR CIRCULATION: --Intracranial internal carotid arteries: Normal. --Anterior cerebral arteries (ACA): Normal. --Middle cerebral arteries (MCA): Normal. ANATOMIC VARIANTS: None Review of the MIP images confirms the above findings. IMPRESSION: Normal CT/CTA of the head. Electronically Signed   By: Ulyses Jarred M.D.   On: 12/24/2020 20:32   MR BRAIN W WO CONTRAST  Result Date: 12/25/2020 CLINICAL DATA:  Headache and dizziness EXAM: MRI HEAD WITHOUT AND WITH CONTRAST MR VENOGRAM HEAD WITHOUT AND WITH CONTRAST TECHNIQUE: Multiplanar, multi-echo pulse sequences of the brain and surrounding structures were acquired without and  with intravenous contrast. Angiographic images of the intracranial venous structures were acquired using MRV technique without and with intravenous contrast. CONTRAST:  46mL GADAVIST GADOBUTROL 1 MMOL/ML IV SOLN COMPARISON:  No pertinent prior exam. FINDINGS: MRI HEAD WITHOUT AND WITH CONTRAST Brain: No acute infarct, mass effect or extra-axial collection. No acute or chronic hemorrhage. Normal white matter signal, parenchymal volume and CSF spaces. The midline structures  are normal. Vascular: Major flow voids are preserved. Skull and upper cervical spine: Normal calvarium and skull base. Visualized upper cervical spine and soft tissues are normal. Sinuses/Orbits:No paranasal sinus fluid levels or advanced mucosal thickening. No mastoid or middle ear effusion. Normal orbits. MR VENOGRAM HEAD WITHOUT AND WITH CONTRAST There is no evidence of dural venous sinus or deep cerebral vein thrombosis. No dural venous sinus stenosis. IMPRESSION: Normal MRI and MRV of the brain. Electronically Signed   By: Ulyses Jarred M.D.   On: 12/25/2020 23:16   MR Venogram Head  Result Date: 12/25/2020 CLINICAL DATA:  Headache and dizziness EXAM: MRI HEAD WITHOUT AND WITH CONTRAST MR VENOGRAM HEAD WITHOUT AND WITH CONTRAST TECHNIQUE: Multiplanar, multi-echo pulse sequences of the brain and surrounding structures were acquired without and with intravenous contrast. Angiographic images of the intracranial venous structures were acquired using MRV technique without and with intravenous contrast. CONTRAST:  42mL GADAVIST GADOBUTROL 1 MMOL/ML IV SOLN COMPARISON:  No pertinent prior exam. FINDINGS: MRI HEAD WITHOUT AND WITH CONTRAST Brain: No acute infarct, mass effect or extra-axial collection. No acute or chronic hemorrhage. Normal white matter signal, parenchymal volume and CSF spaces. The midline structures are normal. Vascular: Major flow voids are preserved. Skull and upper cervical spine: Normal calvarium and skull base. Visualized upper cervical spine and soft tissues are normal. Sinuses/Orbits:No paranasal sinus fluid levels or advanced mucosal thickening. No mastoid or middle ear effusion. Normal orbits. MR VENOGRAM HEAD WITHOUT AND WITH CONTRAST There is no evidence of dural venous sinus or deep cerebral vein thrombosis. No dural venous sinus stenosis. IMPRESSION: Normal MRI and MRV of the brain. Electronically Signed   By: Ulyses Jarred M.D.   On: 12/25/2020 23:16   Portable Chest 1  View  Result Date: 12/25/2020 CLINICAL DATA:  Syncope. EXAM: PORTABLE CHEST 1 VIEW COMPARISON:  None. FINDINGS: The heart size and mediastinal contours are within normal limits. Both lungs are clear. The visualized skeletal structures are unremarkable. IMPRESSION: No active disease. Electronically Signed   By: Titus Dubin M.D.   On: 12/25/2020 10:58   ECHOCARDIOGRAM COMPLETE  Result Date: 12/25/2020    ECHOCARDIOGRAM REPORT   Patient Name:   Terrica MELISSA Hamm Date of Exam: 12/25/2020 Medical Rec #:  [id number]               Height:       63.0 in Accession #:    [id number]              Weight:       207.0 lb Date of Birth:  1976/01/26              BSA:          1.962 m Patient Age:    81 years                BP:           128/95 mmHg Patient Gender: F                       HR:  81 bpm. Exam Location:  Inpatient Procedure: 2D Echo, Cardiac Doppler and Color Doppler Indications:    Syncope  History:        Patient has no prior history of Echocardiogram examinations.                 Risk Factors:Hypertension and Diabetes.  Sonographer:    Jyl Heinz Referring Phys: [id number] DAVID MANUEL [place]  1. Left ventricular ejection fraction, by estimation, is 55 to 60%. Left ventricular ejection fraction by PLAX is 62 %. The left ventricle has normal function. The left ventricle has no regional wall motion abnormalities. There is mild concentric left ventricular hypertrophy. Left ventricular diastolic parameters are consistent with Grade II diastolic dysfunction (pseudonormalization).  2. Right ventricular systolic function is normal. The right ventricular size is normal. There is normal pulmonary artery systolic pressure.  3. The mitral valve is normal in structure. Trivial mitral valve regurgitation. No evidence of mitral stenosis.  4. The aortic valve is tricuspid. Aortic valve regurgitation is not visualized. No aortic stenosis is present.  5. The inferior vena cava is normal in size  with <50% respiratory variability, suggesting right atrial pressure of 8 mmHg. FINDINGS  Left Ventricle: Left ventricular ejection fraction, by estimation, is 55 to 60%. Left ventricular ejection fraction by PLAX is 62 %. The left ventricle has normal function. The left ventricle has no regional wall motion abnormalities. The left ventricular internal cavity size was normal in size. There is mild concentric left ventricular hypertrophy. Left ventricular diastolic parameters are consistent with Grade II diastolic dysfunction (pseudonormalization). Normal left ventricular filling pressure. Right Ventricle: The right ventricular size is normal. No increase in right ventricular wall thickness. Right ventricular systolic function is normal. There is normal pulmonary artery systolic pressure. The tricuspid regurgitant velocity is 1.67 m/s, and  with an assumed right atrial pressure of 8 mmHg, the estimated right ventricular systolic pressure is 64.3 mmHg. Left Atrium: Left atrial size was normal in size. Right Atrium: Right atrial size was normal in size. Pericardium: There is no evidence of pericardial effusion. Mitral Valve: The mitral valve is normal in structure. Trivial mitral valve regurgitation. No evidence of mitral valve stenosis. Tricuspid Valve: The tricuspid valve is normal in structure. Tricuspid valve regurgitation is trivial. No evidence of tricuspid stenosis. Aortic Valve: The aortic valve is tricuspid. Aortic valve regurgitation is not visualized. No aortic stenosis is present. Aortic valve peak gradient measures 10.9 mmHg. Pulmonic Valve: The pulmonic valve was normal in structure. Pulmonic valve regurgitation is trivial. No evidence of pulmonic stenosis. Aorta: The aortic root is normal in size and structure. Venous: The inferior vena cava is normal in size with less than 50% respiratory variability, suggesting right atrial pressure of 8 mmHg. IAS/Shunts: No atrial level shunt detected by color flow  Doppler.  LEFT VENTRICLE PLAX 2D LV EF:         Left            Diastology                ventricular     LV e' medial:    7.34 cm/s                ejection        LV E/e' medial:  9.3                fraction by     LV e' lateral:   7.72 cm/s  PLAX is 62      LV E/e' lateral: 8.9                %. LVIDd:         4.20 cm LVIDs:         2.80 cm LV PW:         1.28 cm LV IVS:        1.14 cm LVOT diam:     2.00 cm LV SV:         77 LV SV Index:   39 LVOT Area:     3.14 cm  LV Volumes (MOD) LV vol d, MOD    83.2 ml A2C: LV vol d, MOD    94.8 ml A4C: LV vol s, MOD    35.1 ml A2C: LV vol s, MOD    39.2 ml A4C: LV SV MOD A2C:   48.1 ml LV SV MOD A4C:   94.8 ml LV SV MOD BP:    54.2 ml RIGHT VENTRICLE             IVC RV Basal diam:  2.80 cm     IVC diam: 1.70 cm RV Mid diam:    2.30 cm RV S prime:     11.90 cm/s TAPSE (M-mode): 2.1 cm LEFT ATRIUM             Index        RIGHT ATRIUM           Index LA diam:        3.60 cm 1.83 cm/m   RA Area:     12.60 cm LA Vol (A2C):   33.9 ml 17.28 ml/m  RA Volume:   26.70 ml  13.61 ml/m LA Vol (A4C):   37.8 ml 19.26 ml/m LA Biplane Vol: 37.6 ml 19.16 ml/m  AORTIC VALVE AV Area (Vmax): 2.32 cm AV Vmax:        165.00 cm/s AV Peak Grad:   10.9 mmHg LVOT Vmax:      122.00 cm/s LVOT Vmean:     89.900 cm/s LVOT VTI:       0.245 m  AORTA Ao Root diam: 2.90 cm Ao Asc diam:  3.20 cm MITRAL VALVE               TRICUSPID VALVE MV Area (PHT): 3.91 cm    TR Peak grad:   11.2 mmHg MV Decel Time: 194 msec    TR Vmax:        167.00 cm/s MV E velocity: 68.60 cm/s MV A velocity: 55.70 cm/s  SHUNTS MV E/A ratio:  1.23        Systemic VTI:  0.24 m                            Systemic Diam: 2.00 cm Skeet Latch MD Electronically signed by Skeet Latch MD Signature Date/Time: 12/25/2020/6:41:32 PM    Final     Scheduled Meds:  atorvastatin  40 mg Oral Daily   dihydroergotamine  0.8 mg Intravenous Q8H   diphenhydrAMINE  12.5 mg Intravenous Q12H   escitalopram  10 mg Oral  Daily   insulin aspart  0-15 Units Subcutaneous TID WC   lisinopril  20 mg Oral Daily   metoCLOPramide (REGLAN) injection  10 mg Intravenous Q8H   sodium chloride flush  3 mL Intravenous Q12H   topiramate  100 mg Oral BID   Continuous  Infusions:  0.45 % NaCl with KCl 20 mEq / L 100 mL/hr at 12/25/20 1719    LOS: 1 day   Kerney Elbe, DO [facility] PAGER is on [place]  If 7PM-7AM, please contact night-coverage [email or web address]

## 2020-12-26 NOTE — Progress Notes (Signed)
Neurology Progress Note  Subjective: No acute overnight events Patient endorses some minimal improvement in headache today but still rates the severity as an 8/10 Patient states that she believes that her headache is related to stress MRI and MRV complete without intracranial abnormality  Exam: Vitals:   12/26/20 1241 12/26/20 1311  BP:    Pulse:    Resp: 20 16  Temp:    SpO2:     Gen: Sitting up in bed, in no acute distress Resp: non-labored breathing, no respiratory distress Abd: soft, non-tender  NEURO:  Mental Status: Awake, alert, and oriented to person, place, time, and situation. She is able to provide a clear and coherent history of present illness. Speech/Language: speech is intact without dysarthria.   Naming, repetition, fluency, and comprehension intact without aphasia No neglect is noted Cranial Nerves:  II: PERRL 4 mm/brisk. Visual fields full.  III, IV, VI: EOMI without ptosis, nystagmus, or gaze preference V: Facial sensation is intact and symmetric to light touch VII: Face is symmetric resting and smiling.  VIII: Hearing is intact to voice IX, X: Palate elevation is symmetric. Phonation normal.  XI: Normal sternocleidomastoid and trapezius muscle strength XII: Tongue protrudes midline without fasciculations.   Motor: 5/5 strength is all muscle groups without vertical drift Tone is normal. Bulk is normal.  Sensation: Intact to light touch bilaterally in all four extremities.  Gait: Deferred  Pertinent Labs: CBC    Component Value Date/Time   WBC 16.6 (H) 12/26/2020 0457   RBC 4.19 12/26/2020 0457   HGB 13.0 12/26/2020 0457   HCT 39.1 12/26/2020 0457   PLT 456 (H) 12/26/2020 0457   MCV 93.3 12/26/2020 0457   MCH 31.0 12/26/2020 0457   MCHC 33.2 12/26/2020 0457   RDW 14.5 12/26/2020 0457   LYMPHSABS 5.8 (H) 12/24/2020 1908   MONOABS 0.9 12/24/2020 1908   EOSABS 0.2 12/24/2020 1908   BASOSABS 0.1 12/24/2020 1908   CMP     Component Value  Date/Time   NA 137 12/26/2020 0457   K 3.4 (L) 12/26/2020 0457   CL 109 12/26/2020 0457   CO2 19 (L) 12/26/2020 0457   GLUCOSE 249 (H) 12/26/2020 0457   BUN 13 12/26/2020 0457   CREATININE 0.61 12/26/2020 0457   CALCIUM 8.7 (L) 12/26/2020 0457   PROT 8.0 11/05/2020 1620   ALBUMIN 4.0 11/05/2020 1620   AST 16 11/05/2020 1620   ALT 15 11/05/2020 1620   ALKPHOS 77 11/05/2020 1620   BILITOT 0.5 11/05/2020 1620   GFRNONAA >60 12/26/2020 0457   Unresulted Labs (From admission, onward)     Start     Ordered   12/25/20 8546  Basic metabolic panel  Daily,   R      12/24/20 2335   12/24/20 2150  Culture, blood (Routine X 2) w Reflex to ID Panel  Once,   R        12/24/20 2150   12/24/20 2140  Culture, blood (Routine X 2) w Reflex to ID Panel  Once,   R        12/24/20 2140   12/24/20 2122  Oligoclonal bands, CSF + serum  (Meningitis Panel)  Once,   R        12/24/20 2121   12/24/20 2122  Draw extra clot tube  (Meningitis Panel)  Once,   R        12/24/20 2121   12/24/20 2121  Culture, blood (routine x 2)  (Meningitis Panel)  BLOOD CULTURE  X 2,   STAT (with TIMED occurrences)      12/24/20 2121   12/24/20 2121  HSV 1/2 PCR, CSF Cerebrospinal Fluid  (Meningitis Panel)  ONCE - STAT,   STAT        12/24/20 2121           Imaging Reviewed:  MRI MRV head 11/18: Normal MRI and MRV of the brain.  Assessment: 45 y.o. female who presented to the ED for evaluation of ongoing severe headache that is felt to be different than her typical migraine headaches with associated nausea, vomiting, photophobia, and phonophobia that is refractory to initial medical management.  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  MRI/MRV obtained without intracranial abnormality.   Recommendations: - Will try DHE 0.75 mg q8h x 3 doses for headache management and reassess tomorrow - Reglan 10 mg 30 minutes prior  to DHE  - Follow up with pending labs - Will need outpatient follow up with Dr. Jaynee Eagles with [facility] for further headache management - Avoid the use of Fioricet as frequent use may cause rebound headache  Anibal Henderson, AGACNP-BC [facility] [phone]  I have seen the patient reviewed the above note.  I think that DHE is reasonable to try, though if this is not successful then I would favor changing therapies to more of a chronic migraine approach.  I have found gabapentin to be useful in these types of situations, and would favor starting her on 300 mg 3 times daily for 2 to 3 days to see if this calms down things.  First, however, we will try DHE to assess for response.  Roland Rack, MD [facility] [phone]  If 7pm- 7am, please page neurology on call as listed in [place].

## 2020-12-27 ENCOUNTER — Telehealth: Payer: Self-pay | Admitting: Emergency Medicine

## 2020-12-27 LAB — CBC WITH DIFFERENTIAL/PLATELET
Abs Immature Granulocytes: 0.2 10*3/uL — ABNORMAL HIGH (ref 0.00–0.07)
Basophils Absolute: 0.2 10*3/uL — ABNORMAL HIGH (ref 0.0–0.1)
Basophils Relative: 1 %
Eosinophils Absolute: 0.8 10*3/uL — ABNORMAL HIGH (ref 0.0–0.5)
Eosinophils Relative: 5 %
HCT: 43.2 % (ref 36.0–46.0)
Hemoglobin: 14.3 g/dL (ref 12.0–15.0)
Lymphocytes Relative: 36 %
Lymphs Abs: 5.5 10*3/uL — ABNORMAL HIGH (ref 0.7–4.0)
MCH: 31 pg (ref 26.0–34.0)
MCHC: 33.1 g/dL (ref 30.0–36.0)
MCV: 93.5 fL (ref 80.0–100.0)
Metamyelocytes Relative: 1 %
Monocytes Absolute: 1.2 10*3/uL — ABNORMAL HIGH (ref 0.1–1.0)
Monocytes Relative: 8 %
Neutro Abs: 7.5 10*3/uL (ref 1.7–7.7)
Neutrophils Relative %: 49 %
Platelets: 517 10*3/uL — ABNORMAL HIGH (ref 150–400)
RBC: 4.62 MIL/uL (ref 3.87–5.11)
RDW: 14.6 % (ref 11.5–15.5)
Smear Review: NORMAL
WBC: 15.3 10*3/uL — ABNORMAL HIGH (ref 4.0–10.5)
nRBC: 0 % (ref 0.0–0.2)

## 2020-12-27 LAB — GLUCOSE, CAPILLARY
Glucose-Capillary: 229 mg/dL — ABNORMAL HIGH (ref 70–99)
Glucose-Capillary: 188 mg/dL — ABNORMAL HIGH (ref 70–99)
Glucose-Capillary: 183 mg/dL — ABNORMAL HIGH (ref 70–99)
Glucose-Capillary: 243 mg/dL — ABNORMAL HIGH (ref 70–99)

## 2020-12-27 LAB — COMPREHENSIVE METABOLIC PANEL
ALT: 17 U/L (ref 0–44)
AST: 12 U/L — ABNORMAL LOW (ref 15–41)
Albumin: 3.4 g/dL — ABNORMAL LOW (ref 3.5–5.0)
Alkaline Phosphatase: 84 U/L (ref 38–126)
Anion gap: 8 (ref 5–15)
BUN: 13 mg/dL (ref 6–20)
CO2: 18 mmol/L — ABNORMAL LOW (ref 22–32)
Calcium: 9.2 mg/dL (ref 8.9–10.3)
Chloride: 114 mmol/L — ABNORMAL HIGH (ref 98–111)
Creatinine, Ser: 0.5 mg/dL (ref 0.44–1.00)
GFR, Estimated: >60 mL/min (ref >60)
Glucose, Bld: 180 mg/dL — ABNORMAL HIGH (ref 70–99)
Potassium: 3.8 mmol/L (ref 3.5–5.1)
Sodium: 140 mmol/L (ref 135–145)
Total Bilirubin: 0.6 mg/dL (ref 0.3–1.2)
Total Protein: 6.9 g/dL (ref 6.5–8.1)

## 2020-12-27 LAB — HSV 1/2 PCR, CSF
HSV-1 DNA: NEGATIVE
HSV-2 DNA: NEGATIVE

## 2020-12-27 LAB — MAGNESIUM: Magnesium: 2 mg/dL (ref 1.7–2.4)

## 2020-12-27 LAB — PHOSPHORUS: Phosphorus: 3.7 mg/dL (ref 2.5–4.6)

## 2020-12-27 MED ORDER — GABAPENTIN 300 MG PO CAPS
300.0000 mg | ORAL_CAPSULE | Freq: Three times a day (TID) | ORAL | Status: AC
  Administered 2020-12-27 – 2020-12-28 (×6): 300 mg via ORAL
  Filled 2020-12-27 (×6): qty 1

## 2020-12-27 MED ORDER — DIPHENOXYLATE-ATROPINE 2.5-0.025 MG PO TABS: 1.0000 | ORAL_TABLET | Freq: Four times a day (QID) | ORAL | Status: DC | PRN

## 2020-12-27 MED ORDER — ALPRAZOLAM 1 MG PO TABS
1.0000 mg | ORAL_TABLET | Freq: Two times a day (BID) | ORAL | Status: DC | PRN
  Administered 2020-12-27 – 2021-01-01 (×9): 1 mg via ORAL
  Filled 2020-12-27 (×9): qty 1

## 2020-12-27 MED ORDER — SUMATRIPTAN SUCCINATE 6 MG/0.5ML ~~LOC~~ SOLN
6.0000 mg | SUBCUTANEOUS | Status: AC | PRN
  Administered 2020-12-27 – 2020-12-28 (×2): 6 mg via SUBCUTANEOUS
  Filled 2020-12-27 (×3): qty 0.5

## 2020-12-27 MED ORDER — HYDROCODONE-ACETAMINOPHEN 5-325 MG PO TABS
1.0000 | ORAL_TABLET | Freq: Four times a day (QID) | ORAL | Status: DC | PRN
  Administered 2020-12-27 – 2020-12-28 (×4): 1 via ORAL
  Filled 2020-12-27 (×4): qty 1

## 2020-12-27 MED ORDER — SODIUM BICARBONATE 8.4 % IV SOLN
INTRAVENOUS | Status: DC
  Filled 2020-12-27 (×2): qty 150

## 2020-12-27 NOTE — Progress Notes (Signed)
PROGRESS NOTE    Jamie Conley  NOI:[id number] DOB: 06/13/1975 DOA: 12/24/2020 PCP: Pcp, No   Brief Narrative:  Patient is a 45 year old obese Caucasian female with a past medical history significant for but not limited to anxiety, depression, history of cancer, diabetes mellitus type 2, hyperlipidemia, hypertension as well as history of migraine headaches who came to the emergency department yesterday evening for the second time the last 3 days due to intractable headache associated with photophobia, nausea, lightheadedness and neck stiffness.  She underwent an LP in the ED and was ruled out for bacterial meningitis.  She was placed on IV acyclovir but this was discontinued by neurology.  She used to live in [facility] but moved here due to allergies that were making her migraine headaches worse.  She has been having a lot of headaches here but worse over the last few days.  She states that she normally gets headache the top of her head and it felt like pressure on both temples.  She usually takes a preventative but she states has not been helping with the Topamax.  She denies fevers or chills and no rhinorrhea or wheezing or hemoptysis.  She states that she had been feeling some chest pressure, dyspnea and palpitations which she attributed to her panic attack.  Subsequently she also associates to syncopal episodes related to her migraine headaches.  She denies any abdominal pain dysuria or hematuria.  In the ED she received a 500 mL bolus and was started on acyclovir and given vancomycin and ceftriaxone given concern for meningitis.  Her antibacterials were discontinued after CSF cell count came back.  She was also given diphenhydramine, lorazepam and metoclopramide as well as magnesium sulfate, ketorolac and hydromorphone for headache with minimal improvement.  Neurology and cardiology were both consulted.  Cardiology was consulted for syncopal episode and felt that she had presyncope in the  setting of severe migraine likely vagal response given that her hemodynamics were normal with no cardiac symptoms.  She had an echocardiogram done which showed a normal EF but did show grade 2 diastolic dysfunction.  Neurology was consulted for intractable headache and worked her up and she underwent an MRI and MRV which were both normal.  Neurology is now starting the patient on the DHE trial.  Assessment & Plan:   Principal Problem:   Headache Active Problems:   Depression   Hypertension   Migraine   Anxiety   Leukocytosis   Hyperlipidemia   Type 2 diabetes mellitus with hyperglycemia (HCC)   Hypokalemia   Syncopal episodes   Hepatic steatosis   Aortic atherosclerosis (HCC)  Acute syncopal episode in the setting of migraine -Has had syncopal episodes in her past associated severe migraines and likely vasovagal in nature -She has a history of being lightheaded and dizzy without passing out from migraines as well -She denies palpitations or chest pain - echocardiogram was ordered and showed a normal EF but did show grade 2 diastolic dysfunction -Cardiology recommends no further work-up and has signed off the case  Intractable headache likely migraine and/status migrainosus -Initially there was concern for bacterial meningitis given that she had stiff neck and headache with elevated WBC -She had LP done which ruled out bacterial meningitis and so her antibiotics were stopped.  Neurology also felt that we could stop the acyclovir -Neurology ordered an MRI and MRV which were both normal -They feel that her headache is related to stress -Patient also underwent a CTA of the head with  and without contrast which was normal and showed no abnormal vasculature -Neurology recommending avoiding the use of Fioricet etc. can cause rebound headaches -Patient was given Robaxin and scheduled Compazine and Benadryl overnight and was also given a dose of both Depakote and magnesium -Given the her  headache is only minimally improved neurology started on DHE trial now that her MRI is negative -She is placed in telemetry and changed to inpatient -She is being continued on topiramate 100 g p.o. twice daily -Acyclovir has been discontinued -Continue with antiemetics as needed -Currently getting fluid hydration with half-normal saline +20 mg of KCl at 100 MLS per and was reduced to 75 MLS per hour but will not change to sodium bicarbonate given her metabolic acidosis -Further care per neurology and a trial of DHE with some improvement however now they are recommending gabapentin 300 g p.o. 3 times daily for 2 to 3 days -Patient still continues to have a significant headache and states it is a 6 out of 10 in severity and states it worsened to 7 out of 10 this morning after speaking with her son.  Hypertension -Continue with lisinopril 20 mg p.o. daily -Continue to monitor blood pressures per protocol -Last blood pressure reading was 109/80  Leukocytosis -Likely reactive and worsened in the setting of steroids -WBC trended up and went from 14.0 and trended up to 16.6 and is now 15.3 -Continue monitor and trend and repeat CBC in a.m. -Antibiotics have been discontinued  Thrombocytosis -Likely reactive -Patient's platelet count went from 547 and trended down to 527 is now 456 -> 517 -Continue to monitor and trend and repeat CBC in the a.m.  Uncontrolled Diabetes Mellitus Type 2 -Hemoglobin A1c was 11.4 -Placed on a carb modified diet -Continue with NovoLog/scale insulin -CBGs ranging from 178-229  Anxiety and depression -Continue with escitalopram 10 mg p.o. daily and continue trazodone 50 to 100 mg p.o. nightly -Given Valium for her MRI yesterday and she is being continued on alprazolam 1 mg p.o. daily as needed anxiety but will change to twice daily  Hepatic steatosis -She was advised to follow-up with her PCP and GI and advised to continue weight loss  Metabolic Acidosis -Mild  and slightly worsened. -Patient CO2 is now 18, anion gap is 8, chloride level is 114 -Change to Sodium Bicarbonate 75 mL/hr -Continue to Monitor and Trend and Repeat CMP in the AM  Hyperlipidemia -Continue with atorvastatin 40 g daily  Hypokalemia.   -Mild at 3.4 and will replete and improved to 3.8 -Checked Mag Level was 2.0 -Continue monitor and trend and repeat CMP in a.m.  Obesity -Complicates overall prognosis and care -Estimated body mass index is 36.42 kg/m as calculated from the following:   Height as of this encounter: 5\' 3"  (1.6 m).   Weight as of this encounter: 93.3 kg. -Weight Loss and Dietary Counseling given  DVT prophylaxis: SCDs Code Status: FULL CODE  Family Communication: No family present at bedside Disposition Plan: Pending further clinical improvement and clearance by specialists  Status is: Inpatient  Remains inpatient appropriate because: She continues have an intractable headache that is minimally improved  Consultants:  Neurology Cardiology  Procedures:  MRI, MRV, ECHO  Antimicrobials:  Anti-infectives (From admission, onward)    Start     Dose/Rate Route Frequency Ordered Stop   12/25/20 0400  acyclovir (ZOVIRAX) 690 mg in dextrose 5 % 100 mL IVPB  Status:  Discontinued        10 mg/kg  69 kg (  Adjusted) 113.8 mL/hr over 60 Minutes Intravenous Every 8 hours 12/24/20 2335 12/25/20 1700   12/25/20 0000  vancomycin (VANCOCIN) IVPB 1000 mg/200 mL premix  Status:  Discontinued       See Hyperspace for full Linked Orders Report.   1,000 mg 200 mL/hr over 60 Minutes Intravenous  Once 12/24/20 2257 12/25/20 0821   12/24/20 2300  vancomycin (VANCOCIN) IVPB 1000 mg/200 mL premix       See Hyperspace for full Linked Orders Report.   1,000 mg 200 mL/hr over 60 Minutes Intravenous  Once 12/24/20 2257 12/25/20 0132   12/24/20 2300  cefTRIAXone (ROCEPHIN) 2 g in sodium chloride 0.9 % 100 mL IVPB  Status:  Discontinued        2 g 200 mL/hr over 30  Minutes Intravenous Every 12 hours 12/24/20 2257 12/25/20 0238   12/24/20 2245  cefTRIAXone (ROCEPHIN) 2 g in sodium chloride 0.9 % 100 mL IVPB  Status:  Discontinued        2 g 200 mL/hr over 30 Minutes Intravenous  Once 12/24/20 2238 12/25/20 0751        Subjective: Seen and examined at bedside she continues to still have some headache and states the DHE helped and it was reduced to 6/10 after her son called her it worsened to 7 out of 10 she started having floaters.  She states the headache was constant and she had some nausea and photophobia.  Denies any chest pain.  No other concerns or this time.  Objective: Vitals:   12/26/20 2048 12/27/20 0324 12/27/20 0331 12/27/20 1335  BP: 122/70 108/89  109/80  Pulse: 70 64  70  Resp: 16 16  16   Temp: 98.2 F (36.8 C) (!) 97.3 F (36.3 C)  98.6 F (37 C)  TempSrc: Oral Oral  Oral  SpO2: 100% 99%  100%  Weight:   93.3 kg   Height:       No intake or output data in the 24 hours ending 12/27/20 1523  Filed Weights   12/24/20 1723 12/27/20 0331  Weight: 93.9 kg 93.3 kg   Examination: Physical Exam:  Constitutional: WN/WD obese Caucasian female in NAD and appears a little anxious and uncomfortable  Eyes: Lids and conjunctivae normal, sclerae anicteric  ENMT: External Ears, Nose appear normal. Grossly normal hearing. Mucous membranes are moist.  Neck: Appears normal, supple, no cervical masses, normal ROM, no appreciable thyromegaly; no appreciable JVD Respiratory: Diminished to auscultation bilaterally, no wheezing, rales, rhonchi or crackles. Normal respiratory effort and patient is not tachypenic. No accessory muscle use.  Unlabored breathing Cardiovascular: RRR, no murmurs / rubs / gallops. S1 and S2 auscultated. No extremity edema.  Abdomen: Soft, non-tender, distended secondary body habitus. Bowel sounds positive.  GU: Deferred. Musculoskeletal: No clubbing / cyanosis of digits/nails. Normal strength and muscle tone.  Skin: No  rashes, lesions, ulcers. No induration; Warm and dry.  Neurologic: CN 2-12 grossly intact with no focal deficits. Romberg sign and cerebellar reflexes not assessed.  Psychiatric: Normal judgment and insight. Alert and oriented x 3.  Slightly anxious mood and appropriate affect.   Data Reviewed: I have personally reviewed following labs and imaging studies  CBC: Recent Labs  Lab 12/23/20 2138 12/24/20 1908 12/26/20 0457 12/27/20 0506  WBC 16.2* 14.0* 16.6* 15.3*  NEUTROABS 8.3* 6.9  --  7.5  HGB 14.2 14.2 13.0 14.3  HCT 41.0 42.6 39.1 43.2  MCV 89.9 92.6 93.3 93.5  PLT 547* 527* 456* 517*  Basic Metabolic Panel: Recent Labs  Lab 12/23/20 2138 12/24/20 1908 12/25/20 0544 12/26/20 0457 12/27/20 0506  NA 138 138 136 137 140  K 3.6 3.7 3.4* 3.4* 3.8  CL 106 103 107 109 114*  CO2 19* 23 20* 19* 18*  GLUCOSE 249* 269* 254* 249* 180*  BUN 16 15 15 13 13   CREATININE 0.75 0.74 0.61 0.61 0.50  CALCIUM 9.9 9.7 8.6* 8.7* 9.2  MG  --   --  2.1  --  2.0  PHOS  --   --   --   --  3.7    GFR: Estimated Creatinine Clearance: 96.5 mL/min (by C-G formula based on SCr of 0.5 mg/dL). Liver Function Tests: Recent Labs  Lab 12/27/20 0506  AST 12*  ALT 17  ALKPHOS 84  BILITOT 0.6  PROT 6.9  ALBUMIN 3.4*   No results for input(s): LIPASE, AMYLASE in the last 168 hours. No results for input(s): AMMONIA in the last 168 hours. Coagulation Profile: No results for input(s): INR, PROTIME in the last 168 hours. Cardiac Enzymes: No results for input(s): CKTOTAL, CKMB, CKMBINDEX, TROPONINI in the last 168 hours. BNP (last 3 results) No results for input(s): PROBNP in the last 8760 hours. HbA1C: Recent Labs    12/25/20 0544  HGBA1C 11.4*    CBG: Recent Labs  Lab 12/26/20 1152 12/26/20 1749 12/26/20 2046 12/27/20 0729 12/27/20 1141  GLUCAP 222* 215* 178* 229* 188*    Lipid Profile: No results for input(s): CHOL, HDL, LDLCALC, TRIG, CHOLHDL, LDLDIRECT in the last 72  hours. Thyroid Function Tests: No results for input(s): TSH, T4TOTAL, FREET4, T3FREE, THYROIDAB in the last 72 hours. Anemia Panel: No results for input(s): VITAMINB12, FOLATE, FERRITIN, TIBC, IRON, RETICCTPCT in the last 72 hours. Sepsis Labs: No results for input(s): PROCALCITON, LATICACIDVEN in the last 168 hours.  Recent Results (from the past 240 hour(s))  Urine Culture     Status: Abnormal   Collection Time: 12/23/20  9:07 PM   Specimen: Urine, Clean Catch  Result Value Ref Range Status   Specimen Description   Final    URINE, CLEAN CATCH Performed at [facility], [address] [place], [place] [address]    Special Requests   Final    NONE Performed at [facility], [place], [place] [address]    Culture (A)  Final    40,000 COLONIES/mL STAPHYLOCOCCUS AUREUS 40,000 COLONIES/mL STREPTOCOCCUS AGALACTIAE TESTING AGAINST S. AGALACTIAE NOT ROUTINELY PERFORMED DUE TO PREDICTABILITY OF AMP/PEN/VAN SUSCEPTIBILITY. Performed at [facility], [place] [address] [place], [place] [address]    Report Status 12/26/2020 FINAL  Final   Organism ID, Bacteria STAPHYLOCOCCUS AUREUS (A)  Final      Susceptibility   Staphylococcus aureus - MIC*    CIPROFLOXACIN <=0.5 SENSITIVE Sensitive     GENTAMICIN <=0.5 SENSITIVE Sensitive     NITROFURANTOIN <=16 SENSITIVE Sensitive     OXACILLIN 0.5 SENSITIVE Sensitive     TETRACYCLINE <=1 SENSITIVE Sensitive     VANCOMYCIN <=0.5 SENSITIVE Sensitive     TRIMETH/SULFA <=10 SENSITIVE Sensitive     CLINDAMYCIN <=0.25 SENSITIVE Sensitive     RIFAMPIN <=0.5 SENSITIVE Sensitive     Inducible Clindamycin NEGATIVE Sensitive     * 40,000 COLONIES/mL STAPHYLOCOCCUS AUREUS  Wet prep, genital     Status: None   Collection Time: 12/23/20  9:10 PM  Result Value Ref Range Status   Yeast Wet Prep HPF POC NONE SEEN NONE SEEN Final  Trich, Wet Prep NONE SEEN NONE SEEN Final   Clue Cells Wet Prep HPF POC  NONE SEEN NONE SEEN Final   WBC, Wet Prep HPF POC <10 <10 Final    Comment: Please note change in reference range.   Sperm NONE SEEN  Final    Comment: Performed at [facility], [address], [place], [place] [address]  Resp Panel by RT-PCR (Flu A&B, Covid) Nasopharyngeal Swab     Status: None   Collection Time: 12/24/20  7:08 PM   Specimen: Nasopharyngeal Swab; Nasopharyngeal(NP) swabs in vial transport medium  Result Value Ref Range Status   SARS Coronavirus 2 by RT PCR NEGATIVE NEGATIVE Final    Comment: (NOTE) SARS-CoV-2 target nucleic acids are NOT DETECTED.  The SARS-CoV-2 RNA is generally detectable in upper respiratory specimens during the acute phase of infection. The lowest concentration of SARS-CoV-2 viral copies this assay can detect is 138 copies/mL. A negative result does not preclude SARS-Cov-2 infection and should not be used as the sole basis for treatment or other patient management decisions. A negative result may occur with  improper specimen collection/handling, submission of specimen other than nasopharyngeal swab, presence of viral mutation(s) within the areas targeted by this assay, and inadequate number of viral copies(<138 copies/mL). A negative result must be combined with clinical observations, patient history, and epidemiological information. The expected result is Negative.  Fact Sheet for Patients:  EntrepreneurPulse.com.au  Fact Sheet for Healthcare Providers:  IncredibleEmployment.be  This test is no t yet approved or cleared by the [place] FDA and  has been authorized for detection and/or diagnosis of SARS-CoV-2 by FDA under an Emergency Use Authorization (EUA). This EUA will remain  in effect (meaning this test can be used) for the duration of the COVID-19 declaration under Section 564(b)(1) of the Act, 21 U.S.C.section 360bbb-3(b)(1), unless the authorization is terminated  or  revoked sooner.       Influenza A by PCR NEGATIVE NEGATIVE Final   Influenza B by PCR NEGATIVE NEGATIVE Final    Comment: (NOTE) The Xpert Xpress SARS-CoV-2/FLU/RSV plus assay is intended as an aid in the diagnosis of influenza from Nasopharyngeal swab specimens and should not be used as a sole basis for treatment. Nasal washings and aspirates are unacceptable for Xpert Xpress SARS-CoV-2/FLU/RSV testing.  Fact Sheet for Patients: EntrepreneurPulse.com.au  Fact Sheet for Healthcare Providers: IncredibleEmployment.be  This test is not yet approved or cleared by the [place] FDA and has been authorized for detection and/or diagnosis of SARS-CoV-2 by FDA under an Emergency Use Authorization (EUA). This EUA will remain in effect (meaning this test can be used) for the duration of the COVID-19 declaration under Section 564(b)(1) of the Act, 21 U.S.C. section 360bbb-3(b)(1), unless the authorization is terminated or revoked.  Performed at [facility], [address] [place], [place] [address]   CSF culture w Gram Stain     Status: None (Preliminary result)   Collection Time: 12/24/20 10:52 PM   Specimen: Lumbar Puncture; Cerebrospinal Fluid  Result Value Ref Range Status   Specimen Description   Final    LUMBAR Performed at [facility], [address], [place], [place] [address]    Special Requests   Final    NONE Performed at [facility], [address], [place], [place] [address]    Gram Stain NO ORGANISMS SEEN CYTOSPIN SMEAR   Final   Culture   Final    NO GROWTH 2 DAYS Performed at [facility], [place] [address], [place], [place] [address]    Report Status PENDING  Incomplete  Anaerobic culture w Gram Stain     Status: None (Preliminary result)   Collection Time: 12/24/20 10:52 PM   Specimen: Lumbar Puncture; Cerebrospinal Fluid  Result Value Ref Range Status    Specimen Description   Final    LUMBAR Performed at [facility], [address] [place], [place] [address]    Special Requests   Final    NONE Performed at [facility], [place], [place] [address]    Gram Stain   Final    NO ORGANISMS SEEN CYTOSPIN SMEAR Performed at [facility], [place] [address] [place], [place] [address]    Culture   Final    NO ANAEROBES ISOLATED; CULTURE IN PROGRESS FOR 5 DAYS   Report Status PENDING  Incomplete  Culture, blood (routine x 2)     Status: None (Preliminary result)   Collection Time: 12/25/20  3:58 AM   Specimen: BLOOD LEFT HAND  Result Value Ref Range Status   Specimen Description   Final    BLOOD LEFT HAND Performed at [facility] [address], [place], [place] [address]    Special Requests   Final    BOTTLES DRAWN AEROBIC ONLY Blood Culture results may not be optimal due to an inadequate volume of blood received in culture bottles Performed at [facility] [address], [place], [place] [address]    Culture   Final    NO GROWTH 1 DAY Performed at [facility], [place] [address] [place], [place] [address]    Report Status PENDING  Incomplete  Culture, blood (routine x 2)     Status: None (Preliminary result)   Collection Time: 12/25/20  3:58 AM   Specimen: BLOOD LEFT ARM  Result Value Ref Range Status   Specimen Description   Final    BLOOD LEFT ARM Performed at [facility] [address], [place], [place] [address]    Special Requests   Final    BOTTLES DRAWN AEROBIC ONLY Blood Culture results may not be optimal due to an inadequate volume of blood received in culture bottles Performed at [facility] [address], [place], [place] [address]    Culture   Final    NO GROWTH 1 DAY Performed at [facility], [place] [address], [place], [place] [address]    Report Status  PENDING  Incomplete    RN Pressure Injury Documentation:    Estimated body mass index is 36.42 kg/m as calculated from the following:   Height as of this encounter: 5\' 3"  (1.6 m).   Weight as of this encounter: 93.3 kg.  Malnutrition Type:   Malnutrition Characteristics:   Nutrition Interventions:    Radiology Studies: MR BRAIN W WO CONTRAST  Result Date: 12/25/2020 CLINICAL DATA:  Headache and dizziness EXAM: MRI HEAD WITHOUT AND WITH CONTRAST MR VENOGRAM HEAD WITHOUT AND WITH CONTRAST TECHNIQUE: Multiplanar, multi-echo pulse sequences of the brain and surrounding structures were acquired without and with intravenous contrast. Angiographic images of the intracranial venous structures were acquired using MRV technique without and with intravenous contrast. CONTRAST:  23mL GADAVIST GADOBUTROL 1 MMOL/ML IV SOLN COMPARISON:  No pertinent prior exam. FINDINGS: MRI HEAD WITHOUT AND WITH CONTRAST Brain: No acute infarct, mass effect or extra-axial collection. No acute or chronic hemorrhage. Normal white matter signal,  parenchymal volume and CSF spaces. The midline structures are normal. Vascular: Major flow voids are preserved. Skull and upper cervical spine: Normal calvarium and skull base. Visualized upper cervical spine and soft tissues are normal. Sinuses/Orbits:No paranasal sinus fluid levels or advanced mucosal thickening. No mastoid or middle ear effusion. Normal orbits. MR VENOGRAM HEAD WITHOUT AND WITH CONTRAST There is no evidence of dural venous sinus or deep cerebral vein thrombosis. No dural venous sinus stenosis. IMPRESSION: Normal MRI and MRV of the brain. Electronically Signed   By: Ulyses Jarred M.D.   On: 12/25/2020 23:16   MR Venogram Head  Result Date: 12/25/2020 CLINICAL DATA:  Headache and dizziness EXAM: MRI HEAD WITHOUT AND WITH CONTRAST MR VENOGRAM HEAD WITHOUT AND WITH CONTRAST TECHNIQUE: Multiplanar, multi-echo pulse sequences of the brain and surrounding structures were  acquired without and with intravenous contrast. Angiographic images of the intracranial venous structures were acquired using MRV technique without and with intravenous contrast. CONTRAST:  84mL GADAVIST GADOBUTROL 1 MMOL/ML IV SOLN COMPARISON:  No pertinent prior exam. FINDINGS: MRI HEAD WITHOUT AND WITH CONTRAST Brain: No acute infarct, mass effect or extra-axial collection. No acute or chronic hemorrhage. Normal white matter signal, parenchymal volume and CSF spaces. The midline structures are normal. Vascular: Major flow voids are preserved. Skull and upper cervical spine: Normal calvarium and skull base. Visualized upper cervical spine and soft tissues are normal. Sinuses/Orbits:No paranasal sinus fluid levels or advanced mucosal thickening. No mastoid or middle ear effusion. Normal orbits. MR VENOGRAM HEAD WITHOUT AND WITH CONTRAST There is no evidence of dural venous sinus or deep cerebral vein thrombosis. No dural venous sinus stenosis. IMPRESSION: Normal MRI and MRV of the brain. Electronically Signed   By: Ulyses Jarred M.D.   On: 12/25/2020 23:16   ECHOCARDIOGRAM COMPLETE  Result Date: 12/25/2020    ECHOCARDIOGRAM REPORT   Patient Name:   Shawniece MELISSA Claire Date of Exam: 12/25/2020 Medical Rec #:  [id number]               Height:       63.0 in Accession #:    [id number]              Weight:       207.0 lb Date of Birth:  08/04/1975              BSA:          1.962 m Patient Age:    81 years                BP:           128/95 mmHg Patient Gender: F                       HR:           81 bpm. Exam Location:  Inpatient Procedure: 2D Echo, Cardiac Doppler and Color Doppler Indications:    Syncope  History:        Patient has no prior history of Echocardiogram examinations.                 Risk Factors:Hypertension and Diabetes.  Sonographer:    Jyl Heinz Referring Phys: [id number] DAVID MANUEL [place]  1. Left ventricular ejection fraction, by estimation, is 55 to 60%. Left ventricular  ejection fraction by PLAX is 62 %. The left ventricle has normal function. The left ventricle has no regional wall motion abnormalities. There is mild concentric left ventricular hypertrophy.  Left ventricular diastolic parameters are consistent with Grade II diastolic dysfunction (pseudonormalization).  2. Right ventricular systolic function is normal. The right ventricular size is normal. There is normal pulmonary artery systolic pressure.  3. The mitral valve is normal in structure. Trivial mitral valve regurgitation. No evidence of mitral stenosis.  4. The aortic valve is tricuspid. Aortic valve regurgitation is not visualized. No aortic stenosis is present.  5. The inferior vena cava is normal in size with <50% respiratory variability, suggesting right atrial pressure of 8 mmHg. FINDINGS  Left Ventricle: Left ventricular ejection fraction, by estimation, is 55 to 60%. Left ventricular ejection fraction by PLAX is 62 %. The left ventricle has normal function. The left ventricle has no regional wall motion abnormalities. The left ventricular internal cavity size was normal in size. There is mild concentric left ventricular hypertrophy. Left ventricular diastolic parameters are consistent with Grade II diastolic dysfunction (pseudonormalization). Normal left ventricular filling pressure. Right Ventricle: The right ventricular size is normal. No increase in right ventricular wall thickness. Right ventricular systolic function is normal. There is normal pulmonary artery systolic pressure. The tricuspid regurgitant velocity is 1.67 m/s, and  with an assumed right atrial pressure of 8 mmHg, the estimated right ventricular systolic pressure is 35.3 mmHg. Left Atrium: Left atrial size was normal in size. Right Atrium: Right atrial size was normal in size. Pericardium: There is no evidence of pericardial effusion. Mitral Valve: The mitral valve is normal in structure. Trivial mitral valve regurgitation. No evidence of  mitral valve stenosis. Tricuspid Valve: The tricuspid valve is normal in structure. Tricuspid valve regurgitation is trivial. No evidence of tricuspid stenosis. Aortic Valve: The aortic valve is tricuspid. Aortic valve regurgitation is not visualized. No aortic stenosis is present. Aortic valve peak gradient measures 10.9 mmHg. Pulmonic Valve: The pulmonic valve was normal in structure. Pulmonic valve regurgitation is trivial. No evidence of pulmonic stenosis. Aorta: The aortic root is normal in size and structure. Venous: The inferior vena cava is normal in size with less than 50% respiratory variability, suggesting right atrial pressure of 8 mmHg. IAS/Shunts: No atrial level shunt detected by color flow Doppler.  LEFT VENTRICLE PLAX 2D LV EF:         Left            Diastology                ventricular     LV e' medial:    7.34 cm/s                ejection        LV E/e' medial:  9.3                fraction by     LV e' lateral:   7.72 cm/s                PLAX is 62      LV E/e' lateral: 8.9                %. LVIDd:         4.20 cm LVIDs:         2.80 cm LV PW:         1.28 cm LV IVS:        1.14 cm LVOT diam:     2.00 cm LV SV:         77 LV SV Index:   39 LVOT Area:     3.14  cm  LV Volumes (MOD) LV vol d, MOD    83.2 ml A2C: LV vol d, MOD    94.8 ml A4C: LV vol s, MOD    35.1 ml A2C: LV vol s, MOD    39.2 ml A4C: LV SV MOD A2C:   48.1 ml LV SV MOD A4C:   94.8 ml LV SV MOD BP:    54.2 ml RIGHT VENTRICLE             IVC RV Basal diam:  2.80 cm     IVC diam: 1.70 cm RV Mid diam:    2.30 cm RV S prime:     11.90 cm/s TAPSE (M-mode): 2.1 cm LEFT ATRIUM             Index        RIGHT ATRIUM           Index LA diam:        3.60 cm 1.83 cm/m   RA Area:     12.60 cm LA Vol (A2C):   33.9 ml 17.28 ml/m  RA Volume:   26.70 ml  13.61 ml/m LA Vol (A4C):   37.8 ml 19.26 ml/m LA Biplane Vol: 37.6 ml 19.16 ml/m  AORTIC VALVE AV Area (Vmax): 2.32 cm AV Vmax:        165.00 cm/s AV Peak Grad:   10.9 mmHg LVOT Vmax:       122.00 cm/s LVOT Vmean:     89.900 cm/s LVOT VTI:       0.245 m  AORTA Ao Root diam: 2.90 cm Ao Asc diam:  3.20 cm MITRAL VALVE               TRICUSPID VALVE MV Area (PHT): 3.91 cm    TR Peak grad:   11.2 mmHg MV Decel Time: 194 msec    TR Vmax:        167.00 cm/s MV E velocity: 68.60 cm/s MV A velocity: 55.70 cm/s  SHUNTS MV E/A ratio:  1.23        Systemic VTI:  0.24 m                            Systemic Diam: 2.00 cm Skeet Latch MD Electronically signed by Skeet Latch MD Signature Date/Time: 12/25/2020/6:41:32 PM    Final     Scheduled Meds:  atorvastatin  40 mg Oral Daily   diphenhydrAMINE  12.5 mg Intravenous Q12H   escitalopram  10 mg Oral Daily   gabapentin  300 mg Oral TID   insulin aspart  0-15 Units Subcutaneous TID WC   lisinopril  20 mg Oral Daily   sodium chloride flush  3 mL Intravenous Q12H   topiramate  100 mg Oral BID   Continuous Infusions:  sodium bicarbonate 150 mEq in D5W infusion 75 mL/hr at 12/27/20 1049    LOS: 2 days   Kerney Elbe, DO [facility] PAGER is on AMION  If 7PM-7AM, please contact night-coverage [email or web address]

## 2020-12-27 NOTE — Progress Notes (Signed)
Neurology Progress Note  Subjective: No acute overnight events Patient endorses some minimal improvement in headache today but still rates the severity as an 6/10 but feels like an acceptable headache level is 4-5/10.  Patient states that in the past she has had Botox injections for migraine management with success  NP messaged by RN after assessment with patient reporting increased headache to a 7-8/10 in severity after a conversation with her son after her last DHE dose  Exam: Vitals:   12/26/20 2048 12/27/20 0324  BP: 122/70 108/89  Pulse: 70 64  Resp: 16 16  Temp: 98.2 F (36.8 C) (!) 97.3 F (36.3 C)  SpO2: 100% 99%   Gen: Sitting up in bed, in no acute distress Resp: non-labored breathing, no respiratory distress Abd: soft, non-tender  NEURO:  Mental Status: Awake, alert, and oriented to person, place, time, and situation. She is able to provide a clear and coherent history of present illness. Speech/Language: speech is intact without dysarthria.   Naming, repetition, fluency, and comprehension intact without aphasia No neglect is noted Cranial Nerves:  II: PERRL. Visual fields full.  III, IV, VI: Fixates and tracks examiner throughout assessment V: Facial sensation is intact and symmetric to light touch VII: Face is symmetric resting and smiling.  VIII: Hearing is intact to voice IX, X: Phonation normal.  XI: Normal sternocleidomastoid and trapezius muscle strength XII: Tongue protrudes midline without fasciculations.   Motor: 5/5 strength is all muscle groups without vertical drift Tone is normal. Bulk is normal.  Sensation: Intact to light touch bilaterally in all four extremities.  Gait: Stable without the use of assistive device  Pertinent Labs: CBC    Component Value Date/Time   WBC 15.3 (H) 12/27/2020 0506   RBC 4.62 12/27/2020 0506   HGB 14.3 12/27/2020 0506   HCT 43.2 12/27/2020 0506   PLT 517 (H) 12/27/2020 0506   MCV 93.5 12/27/2020 0506   MCH  31.0 12/27/2020 0506   MCHC 33.1 12/27/2020 0506   RDW 14.6 12/27/2020 0506   LYMPHSABS 5.5 (H) 12/27/2020 0506   MONOABS 1.2 (H) 12/27/2020 0506   EOSABS 0.8 (H) 12/27/2020 0506   BASOSABS 0.2 (H) 12/27/2020 0506   CMP     Component Value Date/Time   NA 140 12/27/2020 0506   K 3.8 12/27/2020 0506   CL 114 (H) 12/27/2020 0506   CO2 18 (L) 12/27/2020 0506   GLUCOSE 180 (H) 12/27/2020 0506   BUN 13 12/27/2020 0506   CREATININE 0.50 12/27/2020 0506   CALCIUM 9.2 12/27/2020 0506   PROT 6.9 12/27/2020 0506   ALBUMIN 3.4 (L) 12/27/2020 0506   AST 12 (L) 12/27/2020 0506   ALT 17 12/27/2020 0506   ALKPHOS 84 12/27/2020 0506   BILITOT 0.6 12/27/2020 0506   GFRNONAA >60 12/27/2020 0506   Unresulted Labs (From admission, onward)     Start     Ordered   12/24/20 2150  Culture, blood (Routine X 2) w Reflex to ID Panel  Once,   R        12/24/20 2150   12/24/20 2140  Culture, blood (Routine X 2) w Reflex to ID Panel  Once,   R        12/24/20 2140   12/24/20 2122  Oligoclonal bands, CSF + serum  (Meningitis Panel)  Once,   R        12/24/20 2121   12/24/20 2122  Draw extra clot tube  (Meningitis Panel)  Once,   R  12/24/20 2121   12/24/20 2121  HSV 1/2 PCR, CSF Cerebrospinal Fluid  (Meningitis Panel)  ONCE - STAT,   STAT        12/24/20 2121           Imaging Reviewed:  MRI MRV head 11/18: Normal MRI and MRV of the brain.  Assessment: 45 y.o. female who presented to the ED for evaluation of ongoing severe headache that is felt to be different than her typical migraine headaches with associated nausea, vomiting, photophobia, and phonophobia that is refractory to initial medical management.  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  MRI/MRV obtained without intracranial abnormality.   Recommendations: - Avoid the use of Fioricet as frequent use may cause rebound  headache - Initiated gabapentin 300 mg PO TID for chronic migraine for 2 days as gabapentin is sometimes useful in these types of situations.  - Will need outpatient follow up with Dr. Jaynee Eagles with [facility] for further chronic migraine management - No further inpatient neurology recommendations at this time. Please reach out with further questions or concerns.   Anibal Henderson, AGACNP-BC [facility] [phone]

## 2020-12-28 ENCOUNTER — Inpatient Hospital Stay (HOSPITAL_COMMUNITY): Payer: Self-pay

## 2020-12-28 DIAGNOSIS — K625 Hemorrhage of anus and rectum: Secondary | ICD-10-CM

## 2020-12-28 DIAGNOSIS — R319 Hematuria, unspecified: Secondary | ICD-10-CM

## 2020-12-28 DIAGNOSIS — N390 Urinary tract infection, site not specified: Secondary | ICD-10-CM

## 2020-12-28 LAB — CBC WITH DIFFERENTIAL/PLATELET
Abs Immature Granulocytes: 0 10*3/uL (ref 0.00–0.07)
Basophils Absolute: 0.4 10*3/uL — ABNORMAL HIGH (ref 0.0–0.1)
Basophils Relative: 2 %
Eosinophils Absolute: 0.5 10*3/uL (ref 0.0–0.5)
Eosinophils Relative: 3 %
HCT: 38.7 % (ref 36.0–46.0)
Hemoglobin: 13 g/dL (ref 12.0–15.0)
Lymphocytes Relative: 36 %
Lymphs Abs: 6.4 10*3/uL — ABNORMAL HIGH (ref 0.7–4.0)
MCH: 31 pg (ref 26.0–34.0)
MCHC: 33.6 g/dL (ref 30.0–36.0)
MCV: 92.1 fL (ref 80.0–100.0)
Monocytes Absolute: 1.2 10*3/uL — ABNORMAL HIGH (ref 0.1–1.0)
Monocytes Relative: 7 %
Neutro Abs: 9.2 10*3/uL — ABNORMAL HIGH (ref 1.7–7.7)
Neutrophils Relative %: 52 %
Platelets: 470 10*3/uL — ABNORMAL HIGH (ref 150–400)
RBC: 4.2 MIL/uL (ref 3.87–5.11)
RDW: 14.5 % (ref 11.5–15.5)
WBC: 17.7 10*3/uL — ABNORMAL HIGH (ref 4.0–10.5)
nRBC: 0 % (ref 0.0–0.2)

## 2020-12-28 LAB — PHOSPHORUS: Phosphorus: 3.5 mg/dL (ref 2.5–4.6)

## 2020-12-28 LAB — COMPREHENSIVE METABOLIC PANEL
ALT: 14 U/L (ref 0–44)
AST: 12 U/L — ABNORMAL LOW (ref 15–41)
Albumin: 3.3 g/dL — ABNORMAL LOW (ref 3.5–5.0)
Alkaline Phosphatase: 75 U/L (ref 38–126)
Anion gap: 7 (ref 5–15)
BUN: 12 mg/dL (ref 6–20)
CO2: 22 mmol/L (ref 22–32)
Calcium: 8.9 mg/dL (ref 8.9–10.3)
Chloride: 111 mmol/L (ref 98–111)
Creatinine, Ser: 0.6 mg/dL (ref 0.44–1.00)
GFR, Estimated: >60 mL/min (ref >60)
Glucose, Bld: 241 mg/dL — ABNORMAL HIGH (ref 70–99)
Potassium: 3.1 mmol/L — ABNORMAL LOW (ref 3.5–5.1)
Sodium: 140 mmol/L (ref 135–145)
Total Bilirubin: 0.5 mg/dL (ref 0.3–1.2)
Total Protein: 6.4 g/dL — ABNORMAL LOW (ref 6.5–8.1)

## 2020-12-28 LAB — URINALYSIS, ROUTINE W REFLEX MICROSCOPIC
Bilirubin Urine: NEGATIVE
Glucose, UA: 50 mg/dL — AB
Ketones, ur: NEGATIVE mg/dL
Leukocytes,Ua: NEGATIVE
Nitrite: NEGATIVE
Protein, ur: NEGATIVE mg/dL
RBC / HPF: >50 RBC/hpf — ABNORMAL HIGH (ref 0–5)
Specific Gravity, Urine: 1.01 (ref 1.005–1.030)
WBC, UA: >50 WBC/hpf — ABNORMAL HIGH (ref 0–5)
pH: 8 (ref 5.0–8.0)

## 2020-12-28 LAB — GLUCOSE, CAPILLARY
Glucose-Capillary: 234 mg/dL — ABNORMAL HIGH (ref 70–99)
Glucose-Capillary: 185 mg/dL — ABNORMAL HIGH (ref 70–99)
Glucose-Capillary: 175 mg/dL — ABNORMAL HIGH (ref 70–99)
Glucose-Capillary: 195 mg/dL — ABNORMAL HIGH (ref 70–99)

## 2020-12-28 LAB — CSF CULTURE W GRAM STAIN
Culture: NO GROWTH
Gram Stain: NONE SEEN

## 2020-12-28 LAB — MAGNESIUM: Magnesium: 1.8 mg/dL (ref 1.7–2.4)

## 2020-12-28 MED ORDER — LIDOCAINE 5 % EX PTCH
1.0000 | MEDICATED_PATCH | CUTANEOUS | Status: DC
  Administered 2020-12-28 – 2020-12-31 (×4): 1 via TRANSDERMAL
  Filled 2020-12-28 (×5): qty 1

## 2020-12-28 MED ORDER — INSULIN GLARGINE-YFGN 100 UNIT/ML ~~LOC~~ SOLN
9.0000 [IU] | Freq: Every day | SUBCUTANEOUS | Status: DC
  Administered 2020-12-28 – 2020-12-29 (×2): 9 [IU] via SUBCUTANEOUS
  Filled 2020-12-28 (×2): qty 0.09

## 2020-12-28 MED ORDER — HYDROMORPHONE HCL 1 MG/ML IJ SOLN
1.0000 mg | INTRAMUSCULAR | Status: AC | PRN
  Administered 2020-12-28 – 2020-12-29 (×3): 1 mg via INTRAVENOUS
  Filled 2020-12-28 (×3): qty 1

## 2020-12-28 MED ORDER — CEFAZOLIN SODIUM-DEXTROSE 2-4 GM/100ML-% IV SOLN
2.0000 g | Freq: Three times a day (TID) | INTRAVENOUS | Status: DC
Start: 2020-12-28 — End: 2021-01-01
  Administered 2020-12-28 – 2021-01-01 (×11): 2 g via INTRAVENOUS
  Filled 2020-12-28 (×14): qty 100

## 2020-12-28 MED ORDER — POTASSIUM CHLORIDE CRYS ER 20 MEQ PO TBCR
40.0000 meq | EXTENDED_RELEASE_TABLET | Freq: Two times a day (BID) | ORAL | Status: AC
  Administered 2020-12-28 (×2): 40 meq via ORAL
  Filled 2020-12-28 (×2): qty 2

## 2020-12-28 MED ORDER — CEPHALEXIN 500 MG PO CAPS
500.0000 mg | ORAL_CAPSULE | Freq: Four times a day (QID) | ORAL | Status: DC
  Administered 2020-12-28: 500 mg via ORAL
  Filled 2020-12-28: qty 1

## 2020-12-28 MED ORDER — MAGNESIUM SULFATE 2 GM/50ML IV SOLN
2.0000 g | Freq: Once | INTRAVENOUS | Status: AC
  Administered 2020-12-28: 2 g via INTRAVENOUS
  Filled 2020-12-28: qty 50

## 2020-12-28 MED ORDER — HYDROCODONE-ACETAMINOPHEN 5-325 MG PO TABS
1.0000 | ORAL_TABLET | Freq: Four times a day (QID) | ORAL | Status: DC | PRN
Start: 2020-12-28 — End: 2020-12-30
  Administered 2020-12-28 – 2020-12-30 (×7): 2 via ORAL
  Filled 2020-12-28 (×7): qty 2

## 2020-12-28 NOTE — Progress Notes (Signed)
PROGRESS NOTE    Jamie Conley  QIO:[id number] DOB: 1975-10-19 DOA: 12/24/2020 PCP: Pcp, No   Brief Narrative:  Patient is a 45 year old obese Caucasian female with a past medical history significant for but not limited to anxiety, depression, history of cancer, diabetes mellitus type 2, hyperlipidemia, hypertension as well as history of migraine headaches who came to the emergency department yesterday evening for the second time the last 3 days due to intractable headache associated with photophobia, nausea, lightheadedness and neck stiffness.  She underwent an LP in the ED and was ruled out for bacterial meningitis.  She was placed on IV acyclovir but this was discontinued by neurology.  She used to live in [facility] but moved here due to allergies that were making her migraine headaches worse.  She has been having a lot of headaches here but worse over the last few days.  She states that she normally gets headache the top of her head and it felt like pressure on both temples.  She usually takes a preventative but she states has not been helping with the Topamax.  She denies fevers or chills and no rhinorrhea or wheezing or hemoptysis.  She states that she had been feeling some chest pressure, dyspnea and palpitations which she attributed to her panic attack.  Subsequently she also associates to syncopal episodes related to her migraine headaches.  She denies any abdominal pain dysuria or hematuria.  In the ED she received a 500 mL bolus and was started on acyclovir and given vancomycin and ceftriaxone given concern for meningitis.  Her antibacterials were discontinued after CSF cell count came back.  She was also given diphenhydramine, lorazepam and metoclopramide as well as magnesium sulfate, ketorolac and hydromorphone for headache with minimal improvement.  Neurology and cardiology were both consulted.  Cardiology was consulted for syncopal episode and felt that she had presyncope in the  setting of severe migraine likely vagal response given that her hemodynamics were normal with no cardiac symptoms.  She had an echocardiogram done which showed a normal EF but did show grade 2 diastolic dysfunction.  Neurology was consulted for intractable headache and worked her up and she underwent an MRI and MRV which were both normal.  Neurology is now starting the patient on the DHE trial and it improved her headache a little bit but now her headaches are worsening again so they have restarted try gabapentin.  Given that her headache is still persistent we will reconsult neurology for further evaluation  Patient is complain about significant back pain from laying in the bed so we will order a K pad, lidocaine patch and short course of narcotic treatment.  She is now complaining is having some blood in her urine and some bloody stools as well.  We will check an FOBT and restart antibiotics with IV Ancef given that she had a Staphylococcus UTI likely present on admission with Streptococcus agalactiae.  For rectal bleeding we will obtain FOBT and if necessary consult GI.  Assessment & Plan:   Principal Problem:   Headache Active Problems:   Depression   Hypertension   Migraine   Anxiety   Leukocytosis   Hyperlipidemia   Type 2 diabetes mellitus with hyperglycemia (HCC)   Hypokalemia   Syncopal episodes   Hepatic steatosis   Aortic atherosclerosis (HCC)  Acute syncopal episode in the setting of migraine -Has had syncopal episodes in her past associated severe migraines and likely vasovagal in nature -She has a history of being  lightheaded and dizzy without passing out from migraines as well -She denies palpitations or chest pain - echocardiogram was ordered and showed a normal EF but did show grade 2 diastolic dysfunction -Cardiology recommends no further work-up and has signed off the case  Intractable headache likely migraine and/status migrainosus -Initially there was concern for  bacterial meningitis given that she had stiff neck and headache with elevated WBC -She had LP done which ruled out bacterial meningitis and so her antibiotics were stopped.  Neurology also felt that we could stop the acyclovir -Neurology ordered an MRI and MRV which were both normal -They feel that her headache is related to stress -Patient also underwent a CTA of the head with and without contrast which was normal and showed no abnormal vasculature -Neurology recommending avoiding the use of Fioricet etc. can cause rebound headaches -Patient was given Robaxin and scheduled Compazine and Benadryl overnight and was also given a dose of both Depakote and magnesium -Given the her headache is only minimally improved neurology started on DHE trial now that her MRI is negative -She is placed in telemetry and changed to inpatient -She is being continued on topiramate 100 g p.o. twice daily -Acyclovir has been discontinued -Continue with antiemetics as needed -Currently getting fluid hydration with half-normal saline +20 mg of KCl at 100 MLS per and was reduced to 75 MLS per hour but will not change to sodium bicarbonate given her metabolic acidosis -Further care per neurology and a trial of DHE with some improvement however now they are recommending gabapentin 300 g p.o. 3 times daily for 2 to 3 days -Patient still continues to have a significant headache and states it is a 6 out of 10 in severity and states it worsened to 7 out of 10 this morning after speaking with her son.  Hypertension -Continue with lisinopril 20 mg p.o. daily -Continue to monitor blood pressures per protocol -Last blood pressure reading was 111/79  Leukocytosis, worsening -Likely reactive and worsened in the setting of steroids but now could be in the setting of infection from urinary tract infection -WBC trended up and went from 14.0 and trended up to 16.6 and is now 15.3 and today is 17.7 worsened -Continue monitor and trend  and repeat CBC in a.m. -Antibiotics have been discontinued  Back pain -In the setting of laying in the bed not able to ambulate given her photophobia and migraine -Ordered a lidocaine patch as well as a K pad -Patient has 5 to 10 mg of oxycodone IR every 6 as needed and we have added 1 mg of IV hydromorphone every 4 as needed for 3 doses  Hematuria in the setting of staff aureus and Streptococcus agalactiae UTI, present on admission -Her initial urinalysis was extremely bloody -Repeat urinalysis done with a clear appearance with greater than 1000 glucose, large hemoglobin, 1.036, rare bacteria, greater than 50 RBCs per high-power field and 0-5 WBCs -At that time her urine culture grew out 40,000 colonies of Staphylococcus aureus as well as Streptococcus agalactiae; staff aureus was pansensitive -She received IV vancomycin at that time and it was discontinued.  Patient continued to have symptoms and WBC is now worsening -She was placed back on p.o. Keflex but will now go to IV Ancef and we repeated her urinalysis -Urinalysis today showed hazy appearance with 50 glucose, large hemoglobin, negative ketones, negative leukocytes, few bacteria, greater than 50 RBCs per high-power field, greater than 50 WBCs -Patient is complaining of some hematuria today -Given that  she does have some back pain will be obtaining a CT renal stone study -We will follow-up another urine culture and continue empiric Ancef  Rectal bleeding and diarrhea -Patient is complaining about significant diarrhea and had some bleeding -We will check a GI pathogen panel -Will obtain a CT Renal Stone Study as above  -Also obtain FOBT -Initiated on Lomotil and Imodium -If necessary may need to consult GI; 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  Uncontrolled Diabetes Mellitus Type 2 -Hemoglobin A1c was 11.4 -Placed on a carb modified diet -Continue with NovoLog/scale insulin -We will add Semglee 9 units daily -CBGs ranging from 178-229  Anxiety and depression -Continue with escitalopram 10 mg p.o. daily and continue trazodone 50 to 100 mg p.o. nightly -Given Valium for her MRI yesterday and she is being continued on alprazolam 1 mg p.o. daily as needed anxiety but will change to twice daily -Have consulted psychiatry given her significant psychosocial factors contributing to her illness  Hepatic steatosis -She was advised to follow-up with her PCP and GI and advised to continue weight loss  Metabolic Acidosis -Mild and slightly worsened. -Patient CO2 is now 18, anion gap is 8, chloride level is 114 -Change to Sodium Bicarbonate 75 mL/hr -Continue to Monitor and Trend and Repeat CMP in the AM  Hyperlipidemia -Continue with atorvastatin 40 g daily  Hypokalemia.   -Mild at 3.1 -Replete with p.o. KCl 40 mg twice daily x2 doses -Checked Mag Level was 1.8 -Continue monitor and trend and repeat CMP in a.m.  Obesity -Complicates overall prognosis and care -Estimated body mass index is 36.44 kg/m as calculated from the following:   Height as of this encounter: 5\' 3"  (1.6 m).   Weight as of this encounter: 93.3 kg. -Weight Loss and Dietary Counseling given  DVT prophylaxis: SCDs Code Status: FULL CODE  Family Communication: No family present at bedside Disposition Plan: Pending further clinical improvement and clearance by specialists  Status is: Inpatient  Remains inpatient appropriate because: She continues have an intractable headache that is minimally improved  Consultants:  Neurology Cardiology  Procedures:  MRI, MRV, ECHO  Antimicrobials:  Anti-infectives (From admission, onward)    Start     Dose/Rate Route Frequency Ordered Stop   12/28/20 1800  ceFAZolin (ANCEF) IVPB  2g/100 mL premix        2 g 200 mL/hr over 30 Minutes Intravenous Every 8 hours 12/28/20 1634     12/28/20 1200  cephALEXin (KEFLEX) capsule 500 mg  Status:  Discontinued        500 mg Oral Every 6 hours 12/28/20 0914 12/28/20 1633   12/25/20 0400  acyclovir (ZOVIRAX) 690 mg in dextrose 5 % 100 mL IVPB  Status:  Discontinued        10 mg/kg  69 kg (Adjusted) 113.8 mL/hr over 60 Minutes Intravenous Every 8 hours 12/24/20 2335 12/25/20 1700   12/25/20 0000  vancomycin (VANCOCIN) IVPB 1000 mg/200 mL premix  Status:  Discontinued       See Hyperspace for full Linked Orders Report.   1,000 mg 200 mL/hr over 60 Minutes Intravenous  Once 12/24/20 2257 12/25/20 0821   12/24/20 2300  vancomycin (VANCOCIN) IVPB 1000 mg/200 mL premix       See Hyperspace for full  Linked Orders Report.   1,000 mg 200 mL/hr over 60 Minutes Intravenous  Once 12/24/20 2257 12/25/20 0132   12/24/20 2300  cefTRIAXone (ROCEPHIN) 2 g in sodium chloride 0.9 % 100 mL IVPB  Status:  Discontinued        2 g 200 mL/hr over 30 Minutes Intravenous Every 12 hours 12/24/20 2257 12/25/20 0238   12/24/20 2245  cefTRIAXone (ROCEPHIN) 2 g in sodium chloride 0.9 % 100 mL IVPB  Status:  Discontinued        2 g 200 mL/hr over 30 Minutes Intravenous  Once 12/24/20 2238 12/25/20 0751       Subjective: Seen and examined at bedside and states that she is having some blood in her urine but understands now.  Continues to have significant headache and was complaining of neck pain from laying in the bed.  States that the pain medication is not helping.  No nausea or vomiting.  Has been having quite a bit of diarrhea though length no real improvement even after taking Imodium.  Given that she is having blood in her urine we will change her back to IV antibiotics with Ancef and repeat a urinalysis and urine culture.  Objective: Vitals:   12/28/20 0215 12/28/20 0500 12/28/20 0532 12/28/20 1331  BP: 108/67  122/86 111/79  Pulse: 61  64 87  Resp:  16  20   Temp: 98.9 F (37.2 C)  98.9 F (37.2 C) 98.7 F (37.1 C)  TempSrc: Oral  Oral Oral  SpO2: 95%  100% 100%  Weight:  93.3 kg    Height:        Intake/Output Summary (Last 24 hours) at 12/28/2020 1638 Last data filed at 12/28/2020 1152 Gross per 24 hour  Intake 2055.29 ml  Output 100 ml  Net 1955.29 ml    Filed Weights   12/24/20 1723 12/27/20 0331 12/28/20 0500  Weight: 93.9 kg 93.3 kg 93.3 kg   Examination: Physical Exam:  Constitutional: WN/WD obese Caucasian female currently in no acute distress appears a little anxious and uncomfortable though Eyes:  Lids and conjunctivae normal, sclerae anicteric  ENMT: External Ears, Nose appear normal. Grossly normal hearing. Mucous membranes are moist.  Neck: Appears normal, supple, no cervical masses, normal ROM, no appreciable thyromegaly; no appreciable JVD Respiratory: Clear to auscultation bilaterally, no wheezing, rales, rhonchi or crackles. Normal respiratory effort and patient is not tachypenic. No accessory muscle use. Unlabored breathing  Cardiovascular: RRR, no murmurs / rubs / gallops. S1 and S2 auscultated.  No appreciable edema Abdomen: Soft, non-tender, non-distended.  Bowel sounds positive.  GU: Deferred. Musculoskeletal: No clubbing / cyanosis of digits/nails. No joint deformity upper and lower extremities.  Skin: No rashes, lesions, ulcers on limited skin evaluation. No induration; Warm and dry.  Neurologic: CN 2-12 grossly intact with no focal deficits. Romberg sign and cerebellar reflexes not assessed.  Psychiatric: Normal judgment and insight. Alert and oriented x 3.  Anxious mood and appropriate affect.   Data Reviewed: I have personally reviewed following labs and imaging studies  CBC: Recent Labs  Lab 12/23/20 2138 12/24/20 1908 12/26/20 0457 12/27/20 0506 12/28/20 0436  WBC 16.2* 14.0* 16.6* 15.3* 17.7*  NEUTROABS 8.3* 6.9  --  7.5 9.2*  HGB 14.2 14.2 13.0 14.3 13.0  HCT 41.0 42.6 39.1 43.2  38.7  MCV 89.9 92.6 93.3 93.5 92.1  PLT 547* 527* 456* 517* 470*    Basic Metabolic Panel: Recent Labs  Lab 12/24/20 1908 12/25/20 0544 12/26/20 0457 12/27/20  0506 12/28/20 0436  NA 138 136 137 140 140  K 3.7 3.4* 3.4* 3.8 3.1*  CL 103 107 109 114* 111  CO2 23 20* 19* 18* 22  GLUCOSE 269* 254* 249* 180* 241*  BUN 15 15 13 13 12   CREATININE 0.74 0.61 0.61 0.50 0.60  CALCIUM 9.7 8.6* 8.7* 9.2 8.9  MG  --  2.1  --  2.0 1.8  PHOS  --   --   --  3.7 3.5    GFR: Estimated Creatinine Clearance: 96.5 mL/min (by C-G formula based on SCr of 0.6 mg/dL). Liver Function Tests: Recent Labs  Lab 12/27/20 0506 12/28/20 0436  AST 12* 12*  ALT 17 14  ALKPHOS 84 75  BILITOT 0.6 0.5  PROT 6.9 6.4*  ALBUMIN 3.4* 3.3*    No results for input(s): LIPASE, AMYLASE in the last 168 hours. No results for input(s): AMMONIA in the last 168 hours. Coagulation Profile: No results for input(s): INR, PROTIME in the last 168 hours. Cardiac Enzymes: No results for input(s): CKTOTAL, CKMB, CKMBINDEX, TROPONINI in the last 168 hours. BNP (last 3 results) No results for input(s): PROBNP in the last 8760 hours. HbA1C: No results for input(s): HGBA1C in the last 72 hours.  CBG: Recent Labs  Lab 12/27/20 1141 12/27/20 1646 12/27/20 2046 12/28/20 0740 12/28/20 1151  GLUCAP 188* 183* 243* 234* 185*    Lipid Profile: No results for input(s): CHOL, HDL, LDLCALC, TRIG, CHOLHDL, LDLDIRECT in the last 72 hours. Thyroid Function Tests: No results for input(s): TSH, T4TOTAL, FREET4, T3FREE, THYROIDAB in the last 72 hours. Anemia Panel: No results for input(s): VITAMINB12, FOLATE, FERRITIN, TIBC, IRON, RETICCTPCT in the last 72 hours. Sepsis Labs: No results for input(s): PROCALCITON, LATICACIDVEN in the last 168 hours.  Recent Results (from the past 240 hour(s))  Urine Culture     Status: Abnormal   Collection Time: 12/23/20  9:07 PM   Specimen: Urine, Clean Catch  Result Value Ref Range  Status   Specimen Description   Final    URINE, CLEAN CATCH Performed at [facility], [address] [place], [place] [address]    Special Requests   Final    NONE Performed at [facility], [place], [place] [address]    Culture (A)  Final    40,000 COLONIES/mL STAPHYLOCOCCUS AUREUS 40,000 COLONIES/mL STREPTOCOCCUS AGALACTIAE TESTING AGAINST S. AGALACTIAE NOT ROUTINELY PERFORMED DUE TO PREDICTABILITY OF AMP/PEN/VAN SUSCEPTIBILITY. Performed at [facility], [place] [address], [place], [place] [address]    Report Status 12/26/2020 FINAL  Final   Organism ID, Bacteria STAPHYLOCOCCUS AUREUS (A)  Final      Susceptibility   Staphylococcus aureus - MIC*    CIPROFLOXACIN <=0.5 SENSITIVE Sensitive     GENTAMICIN <=0.5 SENSITIVE Sensitive     NITROFURANTOIN <=16 SENSITIVE Sensitive     OXACILLIN 0.5 SENSITIVE Sensitive     TETRACYCLINE <=1 SENSITIVE Sensitive     VANCOMYCIN <=0.5 SENSITIVE Sensitive     TRIMETH/SULFA <=10 SENSITIVE Sensitive     CLINDAMYCIN <=0.25 SENSITIVE Sensitive     RIFAMPIN <=0.5 SENSITIVE Sensitive     Inducible Clindamycin NEGATIVE Sensitive     * 40,000 COLONIES/mL STAPHYLOCOCCUS AUREUS  Wet prep, genital     Status: None   Collection Time: 12/23/20  9:10 PM  Result Value Ref Range Status   Yeast Wet Prep HPF POC NONE SEEN NONE SEEN Final   Trich, Wet Prep NONE SEEN NONE SEEN Final  Clue Cells Wet Prep HPF POC NONE SEEN NONE SEEN Final   WBC, Wet Prep HPF POC <10 <10 Final    Comment: Please note change in reference range.   Sperm NONE SEEN  Final    Comment: Performed at [facility], [address], [place], [place] [address]  Resp Panel by RT-PCR (Flu A&B, Covid) Nasopharyngeal Swab     Status: None   Collection Time: 12/24/20  7:08 PM   Specimen: Nasopharyngeal Swab; Nasopharyngeal(NP) swabs in vial transport medium  Result Value Ref Range Status   SARS Coronavirus 2 by  RT PCR NEGATIVE NEGATIVE Final    Comment: (NOTE) SARS-CoV-2 target nucleic acids are NOT DETECTED.  The SARS-CoV-2 RNA is generally detectable in upper respiratory specimens during the acute phase of infection. The lowest concentration of SARS-CoV-2 viral copies this assay can detect is 138 copies/mL. A negative result does not preclude SARS-Cov-2 infection and should not be used as the sole basis for treatment or other patient management decisions. A negative result may occur with  improper specimen collection/handling, submission of specimen other than nasopharyngeal swab, presence of viral mutation(s) within the areas targeted by this assay, and inadequate number of viral copies(<138 copies/mL). A negative result must be combined with clinical observations, patient history, and epidemiological information. The expected result is Negative.  Fact Sheet for Patients:  EntrepreneurPulse.com.au  Fact Sheet for Healthcare Providers:  IncredibleEmployment.be  This test is no t yet approved or cleared by the [place] FDA and  has been authorized for detection and/or diagnosis of SARS-CoV-2 by FDA under an Emergency Use Authorization (EUA). This EUA will remain  in effect (meaning this test can be used) for the duration of the COVID-19 declaration under Section 564(b)(1) of the Act, 21 U.S.C.section 360bbb-3(b)(1), unless the authorization is terminated  or revoked sooner.       Influenza A by PCR NEGATIVE NEGATIVE Final   Influenza B by PCR NEGATIVE NEGATIVE Final    Comment: (NOTE) The Xpert Xpress SARS-CoV-2/FLU/RSV plus assay is intended as an aid in the diagnosis of influenza from Nasopharyngeal swab specimens and should not be used as a sole basis for treatment. Nasal washings and aspirates are unacceptable for Xpert Xpress SARS-CoV-2/FLU/RSV testing.  Fact Sheet for Patients: EntrepreneurPulse.com.au  Fact Sheet  for Healthcare Providers: IncredibleEmployment.be  This test is not yet approved or cleared by the [place] FDA and has been authorized for detection and/or diagnosis of SARS-CoV-2 by FDA under an Emergency Use Authorization (EUA). This EUA will remain in effect (meaning this test can be used) for the duration of the COVID-19 declaration under Section 564(b)(1) of the Act, 21 U.S.C. section 360bbb-3(b)(1), unless the authorization is terminated or revoked.  Performed at [facility], [address] [place], [place] [address]   Culture, blood (Routine X 2) w Reflex to ID Panel     Status: None (Preliminary result)   Collection Time: 12/24/20  9:40 PM   Specimen: BLOOD  Result Value Ref Range Status   Specimen Description   Final    BLOOD Blood Culture results may not be optimal due to an excessive volume of blood received in culture bottles Performed at [facility], [address] [place], [place] [address]    Special Requests   Final    BLOOD RIGHT HAND Performed at [facility], [address], [place], [place] [address]    Culture   Final    NO GROWTH 2 DAYS Performed at  [facility], [place] [address], [place], [place] [address]    Report Status PENDING  Incomplete  Culture, blood (Routine X 2) w Reflex to ID Panel     Status: None (Preliminary result)   Collection Time: 12/24/20  9:50 PM   Specimen: BLOOD  Result Value Ref Range Status   Specimen Description   Final    BLOOD Blood Culture results may not be optimal due to an excessive volume of blood received in culture bottles Performed at [facility], [address], [place], [place] [address]    Special Requests   Final    BLOOD LEFT HAND Performed at [facility], [address] [place], [place] [address]    Culture   Final    NO GROWTH 2 DAYS Performed at [facility], [place] [address], [place], [place] [address]    Report Status PENDING  Incomplete  CSF culture w Gram Stain     Status: None   Collection Time: 12/24/20 10:52 PM   Specimen: Lumbar Puncture; Cerebrospinal Fluid  Result Value Ref Range Status   Specimen Description   Final    LUMBAR Performed at [facility], [address] [place], [place] [address]    Special Requests   Final    NONE Performed at [facility], [place], [place] [address]    Gram Stain NO ORGANISMS SEEN CYTOSPIN SMEAR   Final   Culture   Final    NO GROWTH Performed at [facility], [place] [address], [place], [place] [address]    Report Status 12/28/2020 FINAL  Final  Anaerobic culture w Gram Stain     Status: None (Preliminary result)   Collection Time: 12/24/20 10:52 PM   Specimen: Lumbar Puncture; Cerebrospinal Fluid  Result Value Ref Range Status   Specimen Description   Final    LUMBAR Performed at [facility], [address] [place], [place] [address]    Special Requests   Final    NONE Performed at [facility], [place], [place] [address]    Gram Stain   Final    NO ORGANISMS SEEN CYTOSPIN SMEAR Performed at [facility], [place] [address], [place], [place] [address]    Culture   Final    NO ANAEROBES ISOLATED; CULTURE IN PROGRESS FOR 5 DAYS   Report Status PENDING  Incomplete  Culture, blood (routine x 2)     Status: None (Preliminary result)   Collection Time: 12/25/20  3:58 AM   Specimen: BLOOD LEFT HAND  Result Value Ref Range Status   Specimen Description   Final    BLOOD LEFT HAND Performed at [facility] [address], [place], [place] [address]    Special Requests   Final    BOTTLES DRAWN AEROBIC ONLY Blood Culture results may not be optimal due to an inadequate volume of blood received in culture bottles Performed at [facility] [address], [place], [place] [address]    Culture   Final    NO GROWTH 3 DAYS Performed at [facility], [place] [address], [place], [place] [address]    Report Status PENDING  Incomplete  Culture, blood (routine x 2)     Status: None (Preliminary result)   Collection Time: 12/25/20  3:58 AM   Specimen: BLOOD LEFT ARM  Result Value Ref Range Status   Specimen Description  Final    BLOOD LEFT ARM Performed at [facility] [address], [place], [place] [address]    Special Requests   Final    BOTTLES DRAWN AEROBIC ONLY Blood Culture results may not be optimal due to an inadequate volume of blood received in culture bottles Performed at [facility] [address] [place], [place] [address]    Culture   Final    NO GROWTH 3 DAYS Performed at [facility], [place] [address] [place], [place] [address]    Report Status PENDING  Incomplete    RN Pressure Injury Documentation:    Estimated body mass index is 36.44 kg/m as calculated from the following:   Height as of this encounter: 5\' 3"  (1.6 m).   Weight as of this encounter: 93.3 kg.  Malnutrition Type:   Malnutrition Characteristics:   Nutrition Interventions:    Radiology Studies: No results found.  Scheduled Meds:  atorvastatin  40 mg Oral Daily   diphenhydrAMINE  12.5 mg Intravenous Q12H   escitalopram  10 mg Oral Daily   gabapentin  300 mg Oral TID   insulin aspart  0-15 Units Subcutaneous TID WC   lidocaine  1 patch Transdermal Q24H   lisinopril  20 mg Oral Daily   potassium chloride  40 mEq Oral BID   sodium chloride flush  3 mL Intravenous Q12H   topiramate  100 mg Oral BID   Continuous Infusions:   ceFAZolin (ANCEF) IV      LOS: 3 days   Kerney Elbe, DO [facility] PAGER is on AMION  If 7PM-7AM, please contact night-coverage [email or web address]

## 2020-12-28 NOTE — Progress Notes (Signed)
Inpatient Diabetes Program Recommendations  AACE/ADA: New Consensus Statement on Inpatient Glycemic Control (2015)  Target Ranges:  Prepandial:   less than 140 mg/dL      Peak postprandial:   less than 180 mg/dL (1-2 hours)      Critically ill patients:  140 - 180 mg/dL    Latest Reference Range & Units 12/27/20 07:29 12/27/20 11:41 12/27/20 16:46 12/27/20 20:46  Glucose-Capillary 70 - 99 mg/dL 229 (H) 188 (H) 183 (H) 243 (H)    Latest Reference Range & Units 12/28/20 07:40  Glucose-Capillary 70 - 99 mg/dL 234 (H)     Home DM Meds: Metformin 1000 mg BID  Current Orders: Novolog Moderate Correction Scale/ SSI (0-15 units) TID AC   MD- Note AM CBGs >200 yesterday and again this AM.  Please consider adding Semglee 9 units Daily (0.1 units/kg)   A1C 11.4% on 12/25/20 indicating an average glucose of 280 mg/dl over the past 2-3 months If patient is discharged on insulin, please consider prescribing affordable insulin (patient prefers insulin pens and would also need insulin pen needles).    --Will follow patient during hospitalization--  Wyn Quaker RN, MSN, CDE Diabetes Coordinator Inpatient Glycemic Control Team Team Pager: [phone] (8a-5p)

## 2020-12-28 NOTE — Progress Notes (Signed)
Pt reports having bright red blood in stools for several weeks. One stool occurrence at shift change last night, RN was unable to assess stool. Pt verbalizes understanding to not flush any more stool until RN can assess.  Pt continues to report migraine pain 7 out of 10.

## 2020-12-29 LAB — ANAEROBIC CULTURE W GRAM STAIN: Gram Stain: NONE SEEN

## 2020-12-29 LAB — CBC WITH DIFFERENTIAL/PLATELET
Abs Immature Granulocytes: 0.04 10*3/uL (ref 0.00–0.07)
Basophils Absolute: 0.1 10*3/uL (ref 0.0–0.1)
Basophils Relative: 1 %
Eosinophils Absolute: 0.7 10*3/uL — ABNORMAL HIGH (ref 0.0–0.5)
Eosinophils Relative: 5 %
HCT: 37.9 % (ref 36.0–46.0)
Hemoglobin: 12.7 g/dL (ref 12.0–15.0)
Immature Granulocytes: 0 %
Lymphocytes Relative: 55 %
Lymphs Abs: 7.7 10*3/uL — ABNORMAL HIGH (ref 0.7–4.0)
MCH: 31.2 pg (ref 26.0–34.0)
MCHC: 33.5 g/dL (ref 30.0–36.0)
MCV: 93.1 fL (ref 80.0–100.0)
Monocytes Absolute: 0.8 10*3/uL (ref 0.1–1.0)
Monocytes Relative: 5 %
Neutro Abs: 4.7 10*3/uL (ref 1.7–7.7)
Neutrophils Relative %: 34 %
Platelets: 455 10*3/uL — ABNORMAL HIGH (ref 150–400)
RBC: 4.07 MIL/uL (ref 3.87–5.11)
RDW: 14.7 % (ref 11.5–15.5)
WBC: 14 10*3/uL — ABNORMAL HIGH (ref 4.0–10.5)
nRBC: 0 % (ref 0.0–0.2)

## 2020-12-29 LAB — COMPREHENSIVE METABOLIC PANEL
ALT: 14 U/L (ref 0–44)
AST: 11 U/L — ABNORMAL LOW (ref 15–41)
Albumin: 3.4 g/dL — ABNORMAL LOW (ref 3.5–5.0)
Alkaline Phosphatase: 76 U/L (ref 38–126)
Anion gap: 6 (ref 5–15)
BUN: 16 mg/dL (ref 6–20)
CO2: 22 mmol/L (ref 22–32)
Calcium: 9 mg/dL (ref 8.9–10.3)
Chloride: 111 mmol/L (ref 98–111)
Creatinine, Ser: 0.57 mg/dL (ref 0.44–1.00)
GFR, Estimated: >60 mL/min (ref >60)
Glucose, Bld: 209 mg/dL — ABNORMAL HIGH (ref 70–99)
Potassium: 3.8 mmol/L (ref 3.5–5.1)
Sodium: 139 mmol/L (ref 135–145)
Total Bilirubin: 0.8 mg/dL (ref 0.3–1.2)
Total Protein: 6.7 g/dL (ref 6.5–8.1)

## 2020-12-29 LAB — GLUCOSE, CAPILLARY
Glucose-Capillary: 220 mg/dL — ABNORMAL HIGH (ref 70–99)
Glucose-Capillary: 228 mg/dL — ABNORMAL HIGH (ref 70–99)
Glucose-Capillary: 180 mg/dL — ABNORMAL HIGH (ref 70–99)
Glucose-Capillary: 242 mg/dL — ABNORMAL HIGH (ref 70–99)

## 2020-12-29 LAB — MAGNESIUM: Magnesium: 1.9 mg/dL (ref 1.7–2.4)

## 2020-12-29 LAB — OLIGOCLONAL BANDS, CSF + SERM

## 2020-12-29 LAB — PHOSPHORUS: Phosphorus: 5 mg/dL — ABNORMAL HIGH (ref 2.5–4.6)

## 2020-12-29 MED ORDER — HYDROMORPHONE HCL 1 MG/ML IJ SOLN
1.0000 mg | INTRAMUSCULAR | Status: AC | PRN
  Administered 2020-12-29 – 2020-12-30 (×3): 1 mg via INTRAVENOUS
  Filled 2020-12-29 (×3): qty 1

## 2020-12-29 MED ORDER — BUSPIRONE HCL 5 MG PO TABS
5.0000 mg | ORAL_TABLET | Freq: Three times a day (TID) | ORAL | Status: DC
  Administered 2020-12-29 – 2021-01-01 (×9): 5 mg via ORAL
  Filled 2020-12-29 (×9): qty 1

## 2020-12-29 MED ORDER — ESCITALOPRAM OXALATE 20 MG PO TABS
20.0000 mg | ORAL_TABLET | Freq: Every day | ORAL | Status: DC
  Administered 2020-12-30 – 2021-01-01 (×3): 20 mg via ORAL
  Filled 2020-12-29 (×3): qty 1

## 2020-12-29 MED ORDER — INSULIN GLARGINE-YFGN 100 UNIT/ML ~~LOC~~ SOLN
12.0000 [IU] | Freq: Every day | SUBCUTANEOUS | Status: DC
Start: 2020-12-30 — End: 2020-12-30
  Filled 2020-12-29: qty 0.12

## 2020-12-29 NOTE — Plan of Care (Signed)
  Problem: Health Behavior/Discharge Planning: Goal: Ability to manage health-related needs will improve Outcome: Progressing   Problem: Clinical Measurements: Goal: Ability to maintain clinical measurements within normal limits will improve Outcome: Progressing Goal: Will remain free from infection Outcome: Progressing Goal: Diagnostic test results will improve Outcome: Progressing Goal: Cardiovascular complication will be avoided Outcome: Completed/Met   Problem: Activity: Goal: Risk for activity intolerance will decrease Outcome: Completed/Met   Problem: Nutrition: Goal: Adequate nutrition will be maintained Outcome: Progressing   Problem: Coping: Goal: Level of anxiety will decrease Outcome: Progressing   Problem: Pain Managment: Goal: General experience of comfort will improve Outcome: Not Progressing   Problem: Safety: Goal: Ability to remain free from injury will improve Outcome: Progressing   Problem: Skin Integrity: Goal: Risk for impaired skin integrity will decrease Outcome: Progressing

## 2020-12-29 NOTE — TOC Progression Note (Signed)
Transition of Care [facility]) - Progression Note    Patient Details  Name: Jamie Conley MRN: [id number] Date of Birth: 10-Jan-1976  [facility]) CM/SW Contact  Leeroy Cha, RN Phone Number: 12/29/2020, 8:20 AM  Clinical Narrative:    Constitutional: WN/WD obese Caucasian female currently in no acute distress appears a little anxious and uncomfortable though Eyes:  Lids and conjunctivae normal, sclerae anicteric  ENMT: External Ears, Nose appear normal. Grossly normal hearing. Mucous membranes are moist.  Neck: Appears normal, supple, no cervical masses, normal ROM, no appreciable thyromegaly; no appreciable JVD Respiratory: Clear to auscultation bilaterally, no wheezing, rales, rhonchi or crackles. Normal respiratory effort and patient is not tachypenic. No accessory muscle use. Unlabored breathing  Cardiovascular: RRR, no murmurs / rubs / gallops. S1 and S2 auscultated.  No appreciable edema Abdomen: Soft, non-tender, non-distended.  Bowel sounds positive.  GU: Deferred. Musculoskeletal: No clubbing / cyanosis of digits/nails. No joint deformity upper and lower extremities.  Skin: No rashes, lesions, ulcers on limited skin evaluation. No induration; Warm and dry.  Neurologic: CN 2-12 grossly intact with no focal deficits. Romberg sign and cerebellar reflexes not assessed.  Psychiatric: Normal judgment and insight. Alert and oriented x 3.  Anxious mood and appropriate affect.  TOC PLAN OF CARE Following for dc needs and progression.  Should be able to go home with self care when ready.   Expected Discharge Plan: Home/Self Care Barriers to Discharge: Inadequate or no insurance, Continued Medical Work up  Expected Discharge Plan and Services Expected Discharge Plan: Home/Self Care In-house Referral: Clinical Social Work   Post Acute Care Choice: NA                   DME Arranged: N/A DME Agency: NA                   Social Determinants of  Health (SDOH) Interventions    Readmission Risk Interventions No flowsheet data found.

## 2020-12-29 NOTE — Consult Note (Signed)
WOC Nurse Consult Note: Patient receiving care in [place]. Reason for Consult: abdominal wound. Patient states has been present for about a month, and does not know exactly how it started. Wound type: shallow wound of unknown origin Pressure Injury POA: Yes/No/NA Measurement: 0.6 cm x 1.8 cm x no measurable depth Wound bed: 100% pink, clean Drainage (amount, consistency, odor) some serosanginous on existing foam dressing Periwound: intact Dressing procedure/placement/frequency: Wash abdominal wound with soap and water, place a small foam dressing over it. When the patient is discharged, she can use an ordinary bandaid to cover it and change that as often as needed.  While there, the patient asked me to look at her feet. She told me she has really bad anxiety and normally picks and pulls at the nails and skin on her feet and toes. Today, no dressings are needed.  I explained if she wants to avoid the risk of amputation she must do the following: examine her feet everyday, using a mirror if necessary, and seeking medical help sooner rather than later if a wound develops on the feet or toes. Also, she must avoid picking and pulling on the nails and skin as this increases her likelihood of infection. She needs to wear shoes at all times and not go around barefooted. She endorses neuropathy and we discussed how this can lead to unrecognized foot injury. She tells me she wears flip flops all the time and has all her life. We discussed she needs a shoe that properly fits without rubbing the foot, and that covers her entire foot.  Also, that she needs to examine the shoes BEFORE she puts her foot into it to check for foreign objects.  Monitor the wound area(s) for worsening of condition such as: Signs/symptoms of infection,  Increase in size,  Development of or worsening of odor, Development of pain, or increased pain at the affected locations.  Notify the medical team if any of these develop.  Thank you  for the consult.  Discussed plan of care with the patient.  [facility] nurse will not follow at this time.  Please re-consult the [place] team if needed.  Val Riles, RN, MSN, CWOCN, CNS-BC, pager [phone]
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
## 2020-12-29 NOTE — Progress Notes (Signed)
Inpatient Diabetes Program Recommendations  AACE/ADA: New Consensus Statement on Inpatient Glycemic Control (2015)  Target Ranges:  Prepandial:   less than 140 mg/dL      Peak postprandial:   less than 180 mg/dL (1-2 hours)      Critically ill patients:  140 - 180 mg/dL    Latest Reference Range & Units 12/28/20 07:40 12/28/20 11:51 12/28/20 16:37 12/28/20 19:55  Glucose-Capillary 70 - 99 mg/dL 234 (H)  5 units Novolog  185 (H)  3 units Novolog  175 (H)  3 units Novolog  9 units Semglee @1848  195 (H)    Latest Reference Range & Units 12/29/20 07:40 12/29/20 11:34  Glucose-Capillary 70 - 99 mg/dL 220 (H)  5 units Novolog  9 units Semglee @0909  228 (H)    Home DM Meds: Metformin 1000 mg BID   Current Orders: Novolog Moderate Correction Scale/ SSI (0-15 units) TID AC     Semglee 9 units Daily     MD- Note Semglee started last PM.  Another dose on board this AM.  CBGs remain >200  Please consider:  1. Increase Semglee to 12 units Daily   2. Start Novolog Meal Coverage: Novolog 3 units TID with meals Hold if pt eats <50% of meal, Hold if pt NPO    A1C 11.4% on 12/25/20 indicating an average glucose of 280 mg/dl over the past 2-3 months If patient is discharged on insulin, please consider prescribing affordable insulin (patient prefers insulin pens and would also need insulin pen needles).      --Will follow patient during hospitalization--  Wyn Quaker RN, MSN, CDE Diabetes Coordinator Inpatient Glycemic Control Team Team Pager: [phone] (8a-5p)

## 2020-12-29 NOTE — Progress Notes (Signed)
PROGRESS NOTE    Jamie Conley  CBU:[id number] DOB: Mar 27, 1975 DOA: 12/24/2020 PCP: Pcp, No   Brief Narrative:  Patient is a 45 year old obese Caucasian female with a past medical history significant for but not limited to anxiety, depression, history of cancer, diabetes mellitus type 2, hyperlipidemia, hypertension as well as history of migraine headaches who came to the emergency department yesterday evening for the second time the last 3 days due to intractable headache associated with photophobia, nausea, lightheadedness and neck stiffness.  She underwent an LP in the ED and was ruled out for bacterial meningitis.  She was placed on IV acyclovir but this was discontinued by neurology.  She used to live in [facility] but moved here due to allergies that were making her migraine headaches worse.  She has been having a lot of headaches here but worse over the last few days.  She states that she normally gets headache the top of her head and it felt like pressure on both temples.  She usually takes a preventative but she states has not been helping with the Topamax.  She denies fevers or chills and no rhinorrhea or wheezing or hemoptysis.  She states that she had been feeling some chest pressure, dyspnea and palpitations which she attributed to her panic attack.  Subsequently she also associates to syncopal episodes related to her migraine headaches.  She denies any abdominal pain dysuria or hematuria.  In the ED she received a 500 mL bolus and was started on acyclovir and given vancomycin and ceftriaxone given concern for meningitis.  Her antibacterials were discontinued after CSF cell count came back.  She was also given diphenhydramine, lorazepam and metoclopramide as well as magnesium sulfate, ketorolac and hydromorphone for headache with minimal improvement.  Neurology and cardiology were both consulted.  Cardiology was consulted for syncopal episode and felt that she had presyncope in the  setting of severe migraine likely vagal response given that her hemodynamics were normal with no cardiac symptoms.  She had an echocardiogram done which showed a normal EF but did show grade 2 diastolic dysfunction.  Neurology was consulted for intractable headache and worked her up and she underwent an MRI and MRV which were both normal.  Neurology is now starting the patient on the DHE trial and it improved her headache a little bit but now her headaches are worsening again so they have restarted try gabapentin.  Given that her headache is still persistent we will reconsult neurology for further evaluation  Patient is complain about significant back pain from laying in the bed so we will order a K pad, lidocaine patch and short course of narcotic treatment.  She is now complaining is having some blood in her urine and some bloody stools as well.  We will check an FOBT and restart antibiotics with IV Ancef given that she had a Staphylococcus UTI likely present on admission with Streptococcus agalactiae.  For rectal bleeding we will obtain FOBT and if necessary consult GI.  Assessment & Plan:   Principal Problem:   Headache Active Problems:   Depression   Hypertension   Migraine   Anxiety   Leukocytosis   Hyperlipidemia   Type 2 diabetes mellitus with hyperglycemia (HCC)   Hypokalemia   Syncopal episodes   Hepatic steatosis   Aortic atherosclerosis (HCC)  Acute syncopal episode in the setting of migraine -Has had syncopal episodes in her past associated severe migraines and likely vasovagal in nature -She has a history of being  lightheaded and dizzy without passing out from migraines as well -She denies palpitations or chest pain - echocardiogram was ordered and showed a normal EF but did show grade 2 diastolic dysfunction -Cardiology recommends no further work-up and has signed off the case  Intractable headache likely migraine and/status migrainosus, persistent  -Initially there was  concern for bacterial meningitis given that she had stiff neck and headache with elevated WBC -She had LP done which ruled out bacterial meningitis and so her antibiotics were stopped.  Neurology also felt that we could stop the acyclovir -Neurology ordered an MRI and MRV which were both normal -They feel that her headache is related to stress -Patient also underwent a CTA of the head with and without contrast which was normal and showed no abnormal vasculature -Neurology recommending avoiding the use of Fioricet etc. can cause rebound headaches -Patient was given Robaxin and scheduled Compazine and Benadryl overnight and was also given a dose of both Depakote and magnesium -Given the her headache is only minimally improved neurology started on DHE trial now that her MRI is negative -She is placed in telemetry and changed to inpatient -She is being continued on topiramate 100 g p.o. twice daily -Acyclovir has been discontinued -Continue with antiemetics as needed -IV fluid hydration has now stopped -Further care per neurology and a trial of DHE with some improvement however now they are recommending gabapentin 300 g p.o. 3 times daily for 2 to 3 days -Patient still continues to have a significant headache and states it is a 9 out of 10 today  -Neurology reconsulted and awaiting further recommendations  Hypertension -Continue with lisinopril 20 mg p.o. daily -Continue to monitor blood pressures per protocol -Last blood pressure reading was 111/80  Leukocytosis, worsening -Likely reactive and worsened in the setting of steroids but now could be in the setting of infection from urinary tract infection -WBC trended up and went from 14.0 and trended up to 16.6 and is now 15.3 and today is 17.7 worsened and is now improving to 14.0 -Continue monitor and trend and repeat CBC in a.m. -Antibiotics have been discontinued  Back pain -In the setting of laying in the bed not able to ambulate given  her photophobia and migraine but could be in the setting of nephrolithiasis -Ordered a lidocaine patch as well as a K pad -Patient has 5 to 10 mg of oxycodone IR every 6 as needed and we have added 1 mg of IV hydromorphone every 4 as needed for 3 doses again  Hematuria in the setting of staff aureus and Streptococcus agalactiae UTI, present on admission -Her initial urinalysis was extremely bloody -Repeat urinalysis done with a clear appearance with greater than 1000 glucose, large hemoglobin, 1.036, rare bacteria, greater than 50 RBCs per high-power field and 0-5 WBCs -At that time her urine culture grew out 40,000 colonies of Staphylococcus aureus as well as Streptococcus agalactiae; staff aureus was pansensitive -She received IV vancomycin at that time and it was discontinued.  Patient continued to have symptoms and WBC is now worsening -She was placed back on p.o. Keflex but will now go to IV Ancef and we repeated her urinalysis -Repeat urinalysis showed hazy appearance with 50 glucose, large hemoglobin, negative ketones, negative leukocytes, few bacteria, greater than 50 RBCs per high-power field, greater than 50 WBCs -Patient is complaining of some hematuria today -Given that she does have some back pain will be obtaining a CT renal stone study and it showed "No acute intra-abdominal or  pelvic pathology. An 11 mm left renal inferior pole calculus similar to prior CT. No hydronephrosis or obstructing stone. Fatty liver. Aortic Atherosclerosis."  -We will follow-up another urine culture and continue empiric Ancef -I spoke with Dr. 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  Uncontrolled Diabetes Mellitus Type 2 -Hemoglobin A1c was 11.4 -Placed on a carb modified diet -Continue with NovoLog/scale insulin -We will add Semglee 9 units daily and increase to 12 units Daily  -CBGs ranging from 180-228  Anxiety and Depression -Continue with escitalopram 10 mg p.o. daily and continue trazodone 50 to 100 mg p.o. nightly -Given Valium for her MRI yesterday and she is being continued on alprazolam 1 mg p.o. daily as needed anxiety but will change to twice daily -Have consulted psychiatry given her significant psychosocial factors contributing to her illness -Psychiatry evaluated and recommending increasing Lexapro to 20 mg p.o. daily and starting BuSpar 5 mg p.o. 3 times daily with a goal to maximize to 10 mg p.o. 3 times daily eventually -Recommending continue to limit the use of alprazolam for anxiety  Hepatic steatosis -She was advised to follow-up with her PCP and GI and advised to continue weight loss -Had Hepatic Steatosis again noted on Renal Stone study   Metabolic Acidosis -Mild and now improved -IV fluid hydration has now stopped -Patient's CO2 is now 22, anion gap is 6, chloride level is 111 -Continue to Monitor and Trend and Repeat CMP in the AM  Hyperlipidemia -Continue with atorvastatin 40 g daily  Hypokalemia.   -Mild at 3.1 and improved to 3.8 -Checked Mag Level was 1.9 -Continue monitor and trend and repeat CMP in a.m.  Abdominal wound -Wound care nurse consulted for further evaluation  Obesity -Complicates overall prognosis and care -Estimated body mass index is 37.34 kg/m as calculated from the  following:   Height as of this encounter: 5\' 3"  (1.6 m).   Weight as of this encounter: 95.6 kg. -Weight Loss and Dietary Counseling given  DVT prophylaxis: SCDs Code Status: FULL CODE  Family Communication: No family present at bedside Disposition Plan: Pending further clinical improvement and clearance by specialists  Status is: Inpatient  Remains inpatient appropriate because: She continues have an intractable headache that is minimally improved  Consultants:  Neurology Cardiology Discussed with Urology   Procedures:  MRI, MRV, ECHO  Antimicrobials:  Anti-infectives (From admission, onward)    Start     Dose/Rate Route Frequency Ordered Stop   12/28/20 1800  ceFAZolin (ANCEF) IVPB 2g/100 mL premix        2 g 200 mL/hr over 30 Minutes Intravenous Every 8 hours 12/28/20 1634     12/28/20 1200  cephALEXin (KEFLEX) capsule 500 mg  Status:  Discontinued        500 mg Oral Every 6 hours 12/28/20 0914  12/28/20 1633   12/25/20 0400  acyclovir (ZOVIRAX) 690 mg in dextrose 5 % 100 mL IVPB  Status:  Discontinued        10 mg/kg  69 kg (Adjusted) 113.8 mL/hr over 60 Minutes Intravenous Every 8 hours 12/24/20 2335 12/25/20 1700   12/25/20 0000  vancomycin (VANCOCIN) IVPB 1000 mg/200 mL premix  Status:  Discontinued       See Hyperspace for full Linked Orders Report.   1,000 mg 200 mL/hr over 60 Minutes Intravenous  Once 12/24/20 2257 12/25/20 0821   12/24/20 2300  vancomycin (VANCOCIN) IVPB 1000 mg/200 mL premix       See Hyperspace for full Linked Orders Report.   1,000 mg 200 mL/hr over 60 Minutes Intravenous  Once 12/24/20 2257 12/25/20 0132   12/24/20 2300  cefTRIAXone (ROCEPHIN) 2 g in sodium chloride 0.9 % 100 mL IVPB  Status:  Discontinued        2 g 200 mL/hr over 30 Minutes Intravenous Every 12 hours 12/24/20 2257 12/25/20 0238   12/24/20 2245  cefTRIAXone (ROCEPHIN) 2 g in sodium chloride 0.9 % 100 mL IVPB  Status:  Discontinued        2 g 200 mL/hr over 30 Minutes  Intravenous  Once 12/24/20 2238 12/25/20 0751       Subjective: Seen and examined at bedside and she continues to have some blood in her urine.  States her diarrhea has stopped now.  Continues to have significant headache and back pain and states that her headache is now 9 out of 10.  States her back is very uncomfortable and states it is mostly on the left side where her kidney stone is.  States that she was able to ambulate yesterday after she was wearing glasses given her photophobia.  Does not feel much improved.  Objective: Vitals:   12/28/20 2015 12/29/20 0439 12/29/20 1013 12/29/20 1358  BP: (!) 144/92 124/87 117/80 117/80  Pulse: 84 72 74 71  Resp: 16 16 19 18   Temp: 98.6 F (37 C) (!) 97.5 F (36.4 C) 97.8 F (36.6 C) (!) 97.3 F (36.3 C)  TempSrc: Oral Oral Oral Oral  SpO2: 100% 100% 100% 99%  Weight:   95.6 kg   Height:        Intake/Output Summary (Last 24 hours) at 12/29/2020 1728 Last data filed at 12/29/2020 1400 Gross per 24 hour  Intake 1020 ml  Output --  Net 1020 ml    Filed Weights   12/27/20 0331 12/28/20 0500 12/29/20 1013  Weight: 93.3 kg 93.3 kg 95.6 kg   Examination: Physical Exam:  Constitutional: WN/WD obese Caucasian female in NAD and appears calm and comfortable Eyes: Lids and conjunctivae normal, sclerae anicteric  ENMT: External Ears, Nose appear normal. Grossly normal hearing. Mucous membranes are moist. Neck: Appears normal, supple, no cervical masses, normal ROM, no appreciable thyromegaly: No appreciable JVD Respiratory: Diminished to auscultation bilaterally, no wheezing, rales, rhonchi or crackles. Normal respiratory effort and patient is not tachypenic. No accessory muscle use.  Unlabored breathing Cardiovascular: RRR, no murmurs / rubs / gallops. S1 and S2 auscultated.  No appreciable extremity edema Abdomen: Soft, non-tender, distended secondary body habitus.  Has a wound on her right lower abdomen.  Bowel sounds positive.  GU:  Deferred. Musculoskeletal: No clubbing / cyanosis of digits/nails. No joint deformity upper and lower extremities.  Skin: No rashes, lesions, ulcers on limited skin evaluation. No induration; Warm and dry.  Neurologic: CN 2-12 grossly intact with  no focal deficits. Romberg sign and cerebellar reflexes not assessed.  Psychiatric: Normal judgment and insight. Alert and oriented x 3. Normal mood and appropriate affect.   Data Reviewed: I have personally reviewed following labs and imaging studies  CBC: Recent Labs  Lab 12/23/20 2138 12/24/20 1908 12/26/20 0457 12/27/20 0506 12/28/20 0436 12/29/20 0425  WBC 16.2* 14.0* 16.6* 15.3* 17.7* 14.0*  NEUTROABS 8.3* 6.9  --  7.5 9.2* 4.7  HGB 14.2 14.2 13.0 14.3 13.0 12.7  HCT 41.0 42.6 39.1 43.2 38.7 37.9  MCV 89.9 92.6 93.3 93.5 92.1 93.1  PLT 547* 527* 456* 517* 470* 455*    Basic Metabolic Panel: Recent Labs  Lab 12/25/20 0544 12/26/20 0457 12/27/20 0506 12/28/20 0436 12/29/20 0425  NA 136 137 140 140 139  K 3.4* 3.4* 3.8 3.1* 3.8  CL 107 109 114* 111 111  CO2 20* 19* 18* 22 22  GLUCOSE 254* 249* 180* 241* 209*  BUN 15 13 13 12 16   CREATININE 0.61 0.61 0.50 0.60 0.57  CALCIUM 8.6* 8.7* 9.2 8.9 9.0  MG 2.1  --  2.0 1.8 1.9  PHOS  --   --  3.7 3.5 5.0*    GFR: Estimated Creatinine Clearance: 97.7 mL/min (by C-G formula based on SCr of 0.57 mg/dL). Liver Function Tests: Recent Labs  Lab 12/27/20 0506 12/28/20 0436 12/29/20 0425  AST 12* 12* 11*  ALT 17 14 14   ALKPHOS 84 75 76  BILITOT 0.6 0.5 0.8  PROT 6.9 6.4* 6.7  ALBUMIN 3.4* 3.3* 3.4*    No results for input(s): LIPASE, AMYLASE in the last 168 hours. No results for input(s): AMMONIA in the last 168 hours. Coagulation Profile: No results for input(s): INR, PROTIME in the last 168 hours. Cardiac Enzymes: No results for input(s): CKTOTAL, CKMB, CKMBINDEX, TROPONINI in the last 168 hours. BNP (last 3 results) No results for input(s): PROBNP in the last 8760  hours. HbA1C: No results for input(s): HGBA1C in the last 72 hours.  CBG: Recent Labs  Lab 12/28/20 1637 12/28/20 1955 12/29/20 0740 12/29/20 1134 12/29/20 1712  GLUCAP 175* 195* 220* 228* 180*    Lipid Profile: No results for input(s): CHOL, HDL, LDLCALC, TRIG, CHOLHDL, LDLDIRECT in the last 72 hours. Thyroid Function Tests: No results for input(s): TSH, T4TOTAL, FREET4, T3FREE, THYROIDAB in the last 72 hours. Anemia Panel: No results for input(s): VITAMINB12, FOLATE, FERRITIN, TIBC, IRON, RETICCTPCT in the last 72 hours. Sepsis Labs: No results for input(s): PROCALCITON, LATICACIDVEN in the last 168 hours.  Recent Results (from the past 240 hour(s))  Urine Culture     Status: Abnormal   Collection Time: 12/23/20  9:07 PM   Specimen: Urine, Clean Catch  Result Value Ref Range Status   Specimen Description   Final    URINE, CLEAN CATCH Performed at [facility], [address] [place], [place] [address]    Special Requests   Final    NONE Performed at [facility], [place], [place] [address]    Culture (A)  Final    40,000 COLONIES/mL STAPHYLOCOCCUS AUREUS 40,000 COLONIES/mL STREPTOCOCCUS AGALACTIAE TESTING AGAINST S. AGALACTIAE NOT ROUTINELY PERFORMED DUE TO PREDICTABILITY OF AMP/PEN/VAN SUSCEPTIBILITY. Performed at [facility], [place] [address], [place], [place] [address]    Report Status 12/26/2020 FINAL  Final   Organism ID, Bacteria STAPHYLOCOCCUS AUREUS (A)  Final      Susceptibility   Staphylococcus aureus - MIC*    CIPROFLOXACIN <=0.5 SENSITIVE Sensitive  GENTAMICIN <=0.5 SENSITIVE Sensitive     NITROFURANTOIN <=16 SENSITIVE Sensitive     OXACILLIN 0.5 SENSITIVE Sensitive     TETRACYCLINE <=1 SENSITIVE Sensitive     VANCOMYCIN <=0.5 SENSITIVE Sensitive     TRIMETH/SULFA <=10 SENSITIVE Sensitive     CLINDAMYCIN <=0.25 SENSITIVE Sensitive     RIFAMPIN <=0.5 SENSITIVE Sensitive     Inducible  Clindamycin NEGATIVE Sensitive     * 40,000 COLONIES/mL STAPHYLOCOCCUS AUREUS  Wet prep, genital     Status: None   Collection Time: 12/23/20  9:10 PM  Result Value Ref Range Status   Yeast Wet Prep HPF POC NONE SEEN NONE SEEN Final   Trich, Wet Prep NONE SEEN NONE SEEN Final   Clue Cells Wet Prep HPF POC NONE SEEN NONE SEEN Final   WBC, Wet Prep HPF POC <10 <10 Final    Comment: Please note change in reference range.   Sperm NONE SEEN  Final    Comment: Performed at [facility], [address] [place], [place] [address]  Resp Panel by RT-PCR (Flu A&B, Covid) Nasopharyngeal Swab     Status: None   Collection Time: 12/24/20  7:08 PM   Specimen: Nasopharyngeal Swab; Nasopharyngeal(NP) swabs in vial transport medium  Result Value Ref Range Status   SARS Coronavirus 2 by RT PCR NEGATIVE NEGATIVE Final    Comment: (NOTE) SARS-CoV-2 target nucleic acids are NOT DETECTED.  The SARS-CoV-2 RNA is generally detectable in upper respiratory specimens during the acute phase of infection. The lowest concentration of SARS-CoV-2 viral copies this assay can detect is 138 copies/mL. A negative result does not preclude SARS-Cov-2 infection and should not be used as the sole basis for treatment or other patient management decisions. A negative result may occur with  improper specimen collection/handling, submission of specimen other than nasopharyngeal swab, presence of viral mutation(s) within the areas targeted by this assay, and inadequate number of viral copies(<138 copies/mL). A negative result must be combined with clinical observations, patient history, and epidemiological information. The expected result is Negative.  Fact Sheet for Patients:  EntrepreneurPulse.com.au  Fact Sheet for Healthcare Providers:  IncredibleEmployment.be  This test is no t yet approved or cleared by the [place] FDA and  has been authorized for  detection and/or diagnosis of SARS-CoV-2 by FDA under an Emergency Use Authorization (EUA). This EUA will remain  in effect (meaning this test can be used) for the duration of the COVID-19 declaration under Section 564(b)(1) of the Act, 21 U.S.C.section 360bbb-3(b)(1), unless the authorization is terminated  or revoked sooner.       Influenza A by PCR NEGATIVE NEGATIVE Final   Influenza B by PCR NEGATIVE NEGATIVE Final    Comment: (NOTE) The Xpert Xpress SARS-CoV-2/FLU/RSV plus assay is intended as an aid in the diagnosis of influenza from Nasopharyngeal swab specimens and should not be used as a sole basis for treatment. Nasal washings and aspirates are unacceptable for Xpert Xpress SARS-CoV-2/FLU/RSV testing.  Fact Sheet for Patients: EntrepreneurPulse.com.au  Fact Sheet for Healthcare Providers: IncredibleEmployment.be  This test is not yet approved or cleared by the [place] FDA and has been authorized for detection and/or diagnosis of SARS-CoV-2 by FDA under an Emergency Use Authorization (EUA). This EUA will remain in effect (meaning this test can be used) for the duration of the COVID-19 declaration under Section 564(b)(1) of the Act, 21 U.S.C. section 360bbb-3(b)(1), unless the authorization is terminated or revoked.  Performed at [facility], [place]  [place], [place], [place] [address]   Culture, blood (Routine X 2) w Reflex to ID Panel     Status: None (Preliminary result)   Collection Time: 12/24/20  9:40 PM   Specimen: BLOOD  Result Value Ref Range Status   Specimen Description   Final    BLOOD Blood Culture results may not be optimal due to an excessive volume of blood received in culture bottles Performed at [facility], [address] [place], [place] [address]    Special Requests   Final    BLOOD RIGHT HAND Performed at [facility], [address]  [place], [place] [address]    Culture   Final    NO GROWTH 3 DAYS Performed at [facility], [place] [address], [place], [place] [address]    Report Status PENDING  Incomplete  Culture, blood (Routine X 2) w Reflex to ID Panel     Status: None (Preliminary result)   Collection Time: 12/24/20  9:50 PM   Specimen: BLOOD  Result Value Ref Range Status   Specimen Description   Final    BLOOD Blood Culture results may not be optimal due to an excessive volume of blood received in culture bottles Performed at [facility], [address], [place], [place] [address]    Special Requests   Final    BLOOD LEFT HAND Performed at [facility], [address] [place], [place] [address]    Culture   Final    NO GROWTH 3 DAYS Performed at [facility], [place] [address] [place], [place] [address]    Report Status PENDING  Incomplete  CSF culture w Gram Stain     Status: None   Collection Time: 12/24/20 10:52 PM   Specimen: Lumbar Puncture; Cerebrospinal Fluid  Result Value Ref Range Status   Specimen Description   Final    LUMBAR Performed at [facility], [address], [place], [place] [address]    Special Requests   Final    NONE Performed at [facility], [place], [place] [address]    Gram Stain NO ORGANISMS SEEN CYTOSPIN SMEAR   Final   Culture   Final    NO GROWTH Performed at [facility], [place] [address], [place], [place] [address]    Report Status 12/28/2020 FINAL  Final  Anaerobic culture w Gram Stain     Status: None   Collection Time: 12/24/20 10:52 PM   Specimen: Lumbar Puncture; Cerebrospinal Fluid  Result Value Ref Range Status   Specimen Description   Final    LUMBAR Performed at [facility], [address] [place], [place] [address]    Special Requests   Final    NONE Performed at [facility], [place], [place] [address]    Gram Stain NO ORGANISMS SEEN CYTOSPIN SMEAR   Final   Culture   Final    NO ANAEROBES ISOLATED Performed at [facility], [place] [address], [place], [place] [address]    Report Status 12/29/2020 FINAL  Final  Culture, blood (routine x 2)     Status: None (Preliminary result)   Collection Time: 12/25/20  3:58 AM   Specimen: BLOOD LEFT HAND  Result Value Ref Range Status   Specimen Description   Final    BLOOD LEFT HAND Performed at [facility] [address] [place],  [address]    Special  Requests   Final    BOTTLES DRAWN AEROBIC ONLY Blood Culture results may not be optimal due to an inadequate volume of blood received in culture bottles Performed at [facility] [address], [place], [place] [address]    Culture   Final    NO GROWTH 4 DAYS Performed at [facility], [place] [address], [place], [place] [address]    Report Status PENDING  Incomplete  Culture, blood (routine x 2)     Status: None (Preliminary result)   Collection Time: 12/25/20  3:58 AM   Specimen: BLOOD LEFT ARM  Result Value Ref Range Status   Specimen Description   Final    BLOOD LEFT ARM Performed at [facility] [address] [address], [place], [place] [address]    Special Requests   Final    BOTTLES DRAWN AEROBIC ONLY Blood Culture results may not be optimal due to an inadequate volume of blood received in culture bottles Performed at [facility] [address], [place], [place] [address]    Culture   Final    NO GROWTH 4 DAYS Performed at [facility], [place] [address] [place], [place] [address]    Report Status PENDING  Incomplete    RN Pressure Injury Documentation:    Estimated body mass index is 37.34 kg/m as calculated from the following:   Height as of this encounter: 5\' 3"  (1.6 m).   Weight as of this encounter: 95.6 kg.  Malnutrition Type:   Malnutrition  Characteristics:   Nutrition Interventions:    Radiology Studies: CT RENAL STONE STUDY  Result Date: 12/28/2020 CLINICAL DATA:  Flank pain.  Concern for kidney stone. EXAM: CT ABDOMEN AND PELVIS WITHOUT CONTRAST TECHNIQUE: Multidetector CT imaging of the abdomen and pelvis was performed following the standard protocol without IV contrast. COMPARISON:  CT abdomen pelvis dated 12/23/2020. FINDINGS: Evaluation of this exam is limited in the absence of intravenous contrast. Lower chest: Faint 6 mm subpleural nodule at the right lung base similar to prior CT. Follow-up as per recommendation of the prior CT. The visualized lung bases are otherwise clear. There is coronary vascular calcification. No intra-abdominal free air or free fluid. Hepatobiliary: Fatty liver. No intrahepatic biliary dilatation. The gallbladder is unremarkable. Pancreas: Unremarkable. No pancreatic ductal dilatation or surrounding inflammatory changes. Spleen: Splenectomy. Probable small splenule or residual splenic tissue in the left upper abdomen. Adrenals/Urinary Tract: The adrenal glands are unremarkable. Left renal inferior pole calculus measures approximately 11 mm similar to prior CT. There is minimal fullness of the left renal collecting system without frank hydronephrosis. No obstructing stone identified. There is no hydronephrosis or nephrolithiasis on the right. The visualized ureters and urinary bladder appear unremarkable. Stomach/Bowel: There is moderate stool throughout the colon. No bowel obstruction or active inflammation. The appendix is normal. Vascular/Lymphatic: Mild aortoiliac atherosclerotic disease. The IVC is unremarkable. No portal venous gas. There is no adenopathy. Reproductive: Hysterectomy.  No adnexal masses. Other: Small fat containing umbilical hernia. Musculoskeletal: No acute osseous pathology. IMPRESSION: 1. No acute intra-abdominal or pelvic pathology. 2. An 11 mm left renal inferior pole calculus similar  to prior CT. No hydronephrosis or obstructing stone. 3. Fatty liver. 4. Aortic Atherosclerosis (ICD10-I70.0). Electronically Signed   By: Anner Crete M.D.   On: 12/28/2020 21:49    Scheduled Meds:  atorvastatin  40 mg Oral Daily   busPIRone  5 mg Oral TID   diphenhydrAMINE  12.5 mg Intravenous Q12H   [  START ON 12/30/2020] escitalopram  20 mg Oral Daily   insulin aspart  0-15 Units Subcutaneous TID WC   [START ON 12/30/2020] insulin glargine-yfgn  12 Units Subcutaneous Daily   lidocaine  1 patch Transdermal Q24H   lisinopril  20 mg Oral Daily   sodium chloride flush  3 mL Intravenous Q12H   topiramate  100 mg Oral BID   Continuous Infusions:   ceFAZolin (ANCEF) IV 2 g (12/29/20 1209)    LOS: 4 days   Kerney Elbe, DO [facility] PAGER is on [place]  If 7PM-7AM, please contact night-coverage [email or web address]

## 2020-12-30 DIAGNOSIS — F0781 Postconcussional syndrome: Secondary | ICD-10-CM

## 2020-12-30 DIAGNOSIS — G43901 Migraine, unspecified, not intractable, with status migrainosus: Principal | ICD-10-CM

## 2020-12-30 LAB — CBC WITH DIFFERENTIAL/PLATELET
Abs Immature Granulocytes: 0.08 10*3/uL — ABNORMAL HIGH (ref 0.00–0.07)
Basophils Absolute: 0.1 10*3/uL (ref 0.0–0.1)
Basophils Relative: 1 %
Eosinophils Absolute: 0.8 10*3/uL — ABNORMAL HIGH (ref 0.0–0.5)
Eosinophils Relative: 6 %
HCT: 41.2 % (ref 36.0–46.0)
Hemoglobin: 13.2 g/dL (ref 12.0–15.0)
Immature Granulocytes: 1 %
Lymphocytes Relative: 54 %
Lymphs Abs: 8 10*3/uL — ABNORMAL HIGH (ref 0.7–4.0)
MCH: 31.7 pg (ref 26.0–34.0)
MCHC: 32 g/dL (ref 30.0–36.0)
MCV: 99 fL (ref 80.0–100.0)
Monocytes Absolute: 0.8 10*3/uL (ref 0.1–1.0)
Monocytes Relative: 6 %
Neutro Abs: 4.5 10*3/uL (ref 1.7–7.7)
Neutrophils Relative %: 32 %
Platelets: 439 10*3/uL — ABNORMAL HIGH (ref 150–400)
RBC: 4.16 MIL/uL (ref 3.87–5.11)
RDW: 15.1 % (ref 11.5–15.5)
WBC: 14.3 10*3/uL — ABNORMAL HIGH (ref 4.0–10.5)
nRBC: 0 % (ref 0.0–0.2)

## 2020-12-30 LAB — CULTURE, BLOOD (ROUTINE X 2)
Culture: NO GROWTH
Culture: NO GROWTH
Culture: NO GROWTH
Culture: NO GROWTH

## 2020-12-30 LAB — GLUCOSE, CAPILLARY
Glucose-Capillary: 261 mg/dL — ABNORMAL HIGH (ref 70–99)
Glucose-Capillary: 299 mg/dL — ABNORMAL HIGH (ref 70–99)
Glucose-Capillary: 300 mg/dL — ABNORMAL HIGH (ref 70–99)
Glucose-Capillary: 199 mg/dL — ABNORMAL HIGH (ref 70–99)

## 2020-12-30 LAB — COMPREHENSIVE METABOLIC PANEL
ALT: 12 U/L (ref 0–44)
AST: 16 U/L (ref 15–41)
Albumin: 3.5 g/dL (ref 3.5–5.0)
Alkaline Phosphatase: 80 U/L (ref 38–126)
Anion gap: 7 (ref 5–15)
BUN: 14 mg/dL (ref 6–20)
CO2: 25 mmol/L (ref 22–32)
Calcium: 9.2 mg/dL (ref 8.9–10.3)
Chloride: 105 mmol/L (ref 98–111)
Creatinine, Ser: 0.62 mg/dL (ref 0.44–1.00)
GFR, Estimated: >60 mL/min (ref >60)
Glucose, Bld: 331 mg/dL — ABNORMAL HIGH (ref 70–99)
Potassium: 3.5 mmol/L (ref 3.5–5.1)
Sodium: 137 mmol/L (ref 135–145)
Total Bilirubin: 0.4 mg/dL (ref 0.3–1.2)
Total Protein: 6.9 g/dL (ref 6.5–8.1)

## 2020-12-30 LAB — URINE CULTURE: Culture: <10000 — AB

## 2020-12-30 LAB — MAGNESIUM: Magnesium: 1.7 mg/dL (ref 1.7–2.4)

## 2020-12-30 LAB — PHOSPHORUS: Phosphorus: 4.8 mg/dL — ABNORMAL HIGH (ref 2.5–4.6)

## 2020-12-30 MED ORDER — HYDRALAZINE HCL 20 MG/ML IJ SOLN: 10.0000 mg | INTRAMUSCULAR | Status: DC | PRN

## 2020-12-30 MED ORDER — DEXAMETHASONE SODIUM PHOSPHATE 10 MG/ML IJ SOLN
10.0000 mg | Freq: Once | INTRAMUSCULAR | Status: AC
  Administered 2020-12-30: 10 mg via INTRAVENOUS
  Filled 2020-12-30: qty 1

## 2020-12-30 MED ORDER — INSULIN GLARGINE-YFGN 100 UNIT/ML ~~LOC~~ SOLN
16.0000 [IU] | Freq: Every day | SUBCUTANEOUS | Status: DC
  Administered 2020-12-30 – 2020-12-31 (×2): 16 [IU] via SUBCUTANEOUS
  Filled 2020-12-30 (×3): qty 0.16

## 2020-12-30 MED ORDER — IPRATROPIUM-ALBUTEROL 0.5-2.5 (3) MG/3ML IN SOLN: 3.0000 mL | RESPIRATORY_TRACT | Status: DC | PRN

## 2020-12-30 MED ORDER — METOPROLOL TARTRATE 5 MG/5ML IV SOLN: 5.0000 mg | INTRAVENOUS | Status: DC | PRN

## 2020-12-30 MED ORDER — SENNOSIDES-DOCUSATE SODIUM 8.6-50 MG PO TABS
1.0000 | ORAL_TABLET | Freq: Every evening | ORAL | Status: DC | PRN
  Administered 2020-12-31: 1 via ORAL
  Filled 2020-12-30: qty 1

## 2020-12-30 MED ORDER — DOCUSATE SODIUM 100 MG PO CAPS
100.0000 mg | ORAL_CAPSULE | Freq: Two times a day (BID) | ORAL | Status: DC
  Administered 2020-12-30 – 2021-01-01 (×5): 100 mg via ORAL
  Filled 2020-12-30 (×5): qty 1

## 2020-12-30 MED ORDER — GUAIFENESIN 100 MG/5ML PO LIQD: 5.0000 mL | ORAL | Status: DC | PRN

## 2020-12-30 MED ORDER — POTASSIUM CHLORIDE CRYS ER 20 MEQ PO TBCR
40.0000 meq | EXTENDED_RELEASE_TABLET | Freq: Once | ORAL | Status: AC
  Administered 2020-12-30: 40 meq via ORAL
  Filled 2020-12-30: qty 2

## 2020-12-30 MED ORDER — GABAPENTIN 300 MG PO CAPS
300.0000 mg | ORAL_CAPSULE | Freq: Three times a day (TID) | ORAL | Status: DC
  Administered 2020-12-30 – 2021-01-01 (×7): 300 mg via ORAL
  Filled 2020-12-30 (×7): qty 1

## 2020-12-30 MED ORDER — HYDROMORPHONE HCL 1 MG/ML IJ SOLN
1.0000 mg | INTRAMUSCULAR | Status: DC | PRN
  Administered 2020-12-30: 1 mg via INTRAVENOUS
  Filled 2020-12-30: qty 1

## 2020-12-30 MED ORDER — HYDROMORPHONE HCL 2 MG PO TABS
2.0000 mg | ORAL_TABLET | Freq: Four times a day (QID) | ORAL | Status: DC | PRN
  Administered 2020-12-30 – 2021-01-01 (×7): 2 mg via ORAL
  Filled 2020-12-30 (×7): qty 1

## 2020-12-30 NOTE — Progress Notes (Signed)
PROGRESS NOTE    Jamie Conley  YOK:[id number] DOB: 04-30-1975 DOA: 12/24/2020 PCP: Pcp, No   Brief Narrative:  45 year old with history of anxiety, depression, cancer, DM2, HLD, HTN presenting with headaches, neck stiffness, photophobia.  Underwent lumbar puncture in the ER, empirically initially was started on IV acyclovir which was eventually discontinued.  Eventually rest of the antibacterials were discontinued as well once the CSF was negative.  Patient also received headache cocktail with minimal relief, neurology and cardiology were consulted.  Patient was seen by cardiology due to possible syncopal event.  MRI/MRV both were negative and started on DHE trial and then gabapentin.  Also developed dysuria type symptoms, UA was positive.   Assessment & Plan:   Principal Problem:   Headache Active Problems:   Depression   Hypertension   Migraine   Anxiety   Leukocytosis   Hyperlipidemia   Type 2 diabetes mellitus with hyperglycemia (HCC)   Hypokalemia   Syncopal episodes   Hepatic steatosis   Aortic atherosclerosis (HCC)   Status migrainosus   Postconcussive syndrome       Intractable headache likely migraine and/status migrainosus, persistent  -Underwent extensive work-up including lumbar puncture, MRI/MRV, CTA head and neck.  All of this has been negative.  Initially was on antiviral and anti-infectives which were all discontinued now. - Neurology team is following - Current medications-Benadryl, gabapentin  Acute syncopal episode in the setting of migraine -This is now resolved.  Echocardiogram during hospitalization was normal EF, grade 2 diastolic dysfunction.  Patient was seen by cardiology, no further work-up at this time   Essential hypertension - Lisinopril daily.  IV hydralazine and Lopressor as needed   Leukocytosis, worsening -Likely reactive and worsened in the setting of steroids but now could be in the setting of infection from urinary tract  infection -WBC trended up and went from 14.0 and trended up to 16.6 and is now 15.3 and today is 17.7 worsened and is now improving to 14.0 -Continue monitor and trend and repeat CBC in a.m. -Antibiotics have been discontinued   Back pain -As needed pain medications   Hematuria in the setting of staff aureus and Streptococcus agalactiae UTI, present on admission CT shown nonobstructive renal stone which was discussed with urology by previous provider who recommends outpatient follow-up and treatment for urinary tract infection - Cultures have grown Streptococcus, continue IV Ancef  Rectal bleeding and diarrhea -GI panel, Hemoccult?-Pending -Hemoglobin remained stable    Uncontrolled Diabetes Mellitus Type 2, from hyperglycemia -hemoglobin A1c 11.4.  Increase Semglee 16 units daily.  Sliding scale and Accu-Cheks   Anxiety and Depression -Seen by psychiatry.  Recommending BuSpar 5 mg 3 times daily,Lexapro 20 mg 3 times daily.  As needed Xanax   Hepatic steatosis -Follow-up outpatient   Metabolic Acidosis -Resolved   Hyperlipidemia -Lipitor 40 mg daily   Hypokalemia.   -Replete p.o.   Abdominal wound -Wound care nurse consulted for further evaluation   Obesity -Complicates overall prognosis and care -Estimated body mass index is 37.34 kg/m as calculated from the following:   Height as of this encounter: 5\' 3"  (1.6 m).   Weight as of this encounter: 95.6 kg. -Weight Loss and Dietary Counseling given      DVT prophylaxis: SCDs Code Status: Full code Family Communication:    Status is: Inpatient  Remains inpatient appropriate because: Still having quite a bit of headache       Nutritional status  Body mass index is 37.34 kg/m.           Subjective: Patient is convinced that she is having abdominal pain secondary to her renal stones and would like these to be removed.  She tells me that her anatomy is abnormal therefore even small  stones causes her issues.  She has followed with urology in [place], have requested to obtain records from there which she will work on today. Continues to have headache.  Review of Systems Otherwise negative except as per HPI, including: General: Denies fever, chills, night sweats or unintended weight loss. Resp: Denies cough, wheezing, shortness of breath. Cardiac: Denies chest pain, palpitations, orthopnea, paroxysmal nocturnal dyspnea. GI: Denies nausea, vomiting, diarrhea or constipation GU: Denies dysuria, frequency, hesitancy or incontinence MS: Denies muscle aches, joint pain or swelling Neuro: Denies headache, neurologic deficits (focal weakness, numbness, tingling), abnormal gait Psych: Denies anxiety, depression, SI/HI/AVH Skin: Denies new rashes or lesions ID: Denies sick contacts, exotic exposures, travel  Examination:  General exam: Appears calm and comfortable  Respiratory system: Clear to auscultation. Respiratory effort normal. Cardiovascular system: S1 & S2 heard, RRR. No JVD, murmurs, rubs, gallops or clicks. No pedal edema. Gastrointestinal system: Abdomen is nondistended, soft and nontender. No organomegaly or masses felt. Normal bowel sounds heard. Central nervous system: Alert and oriented. No focal neurological deficits. Extremities: Symmetric 5 x 5 power. Skin: No rashes, lesions or ulcers Psychiatry: Judgement and insight appear normal. Mood & affect appropriate.     Objective: Vitals:   12/29/20 1013 12/29/20 1358 12/29/20 1945 12/30/20 0553  BP: 117/80 117/80 (!) 127/91 114/77  Pulse: 74 71 76 73  Resp: 19 18 16 16   Temp: 97.8 F (36.6 C) (!) 97.3 F (36.3 C) 98.3 F (36.8 C) 97.8 F (36.6 C)  TempSrc: Oral Oral Oral Oral  SpO2: 100% 99% 99% 100%  Weight: 95.6 kg     Height:        Intake/Output Summary (Last 24 hours) at 12/30/2020 0906 Last data filed at 12/30/2020 0315 Gross per 24 hour  Intake 680 ml  Output 400 ml  Net 280 ml    Filed Weights   12/27/20 0331 12/28/20 0500 12/29/20 1013  Weight: 93.3 kg 93.3 kg 95.6 kg     Data Reviewed:   CBC: Recent Labs  Lab 12/24/20 1908 12/26/20 0457 12/27/20 0506 12/28/20 0436 12/29/20 0425 12/30/20 0426  WBC 14.0* 16.6* 15.3* 17.7* 14.0* 14.3*  NEUTROABS 6.9  --  7.5 9.2* 4.7 4.5  HGB 14.2 13.0 14.3 13.0 12.7 13.2  HCT 42.6 39.1 43.2 38.7 37.9 41.2  MCV 92.6 93.3 93.5 92.1 93.1 99.0  PLT 527* 456* 517* 470* 455* 456*   Basic Metabolic Panel: Recent Labs  Lab 12/25/20 0544 12/26/20 0457 12/27/20 0506 12/28/20 0436 12/29/20 0425 12/30/20 0426  NA 136 137 140 140 139 137  K 3.4* 3.4* 3.8 3.1* 3.8 3.5  CL 107 109 114* 111 111 105  CO2 20* 19* 18* 22 22 25   GLUCOSE 254* 249* 180* 241* 209* 331*  BUN 15 13 13 12 16 14   CREATININE 0.61 0.61 0.50 0.60 0.57 0.62  CALCIUM 8.6* 8.7* 9.2 8.9 9.0 9.2  MG 2.1  --  2.0 1.8 1.9 1.7  PHOS  --   --  3.7 3.5 5.0* 4.8*   GFR: Estimated Creatinine Clearance: 97.7 mL/min (by C-G formula based on SCr of 0.62 mg/dL). Liver Function Tests: Recent Labs  Lab 12/27/20 0506 12/28/20 0436 12/29/20 0425 12/30/20 0426  AST 12* 12* 11* 16  ALT 17 14 14 12   ALKPHOS 84 75 76 80  BILITOT 0.6 0.5 0.8 0.4  PROT 6.9 6.4* 6.7 6.9  ALBUMIN 3.4* 3.3* 3.4* 3.5   No results for input(s): LIPASE, AMYLASE in the last 168 hours. No results for input(s): AMMONIA in the last 168 hours. Coagulation Profile: No results for input(s): INR, PROTIME in the last 168 hours. Cardiac Enzymes: No results for input(s): CKTOTAL, CKMB, CKMBINDEX, TROPONINI in the last 168 hours. BNP (last 3 results) No results for input(s): PROBNP in the last 8760 hours. HbA1C: No results for input(s): HGBA1C in the last 72 hours. CBG: Recent Labs  Lab 12/29/20 0740 12/29/20 1134 12/29/20 1712 12/29/20 1952 12/30/20 0745  GLUCAP 220* 228* 180* 242* 261*   Lipid Profile: No results for input(s): CHOL, HDL, LDLCALC, TRIG, CHOLHDL, LDLDIRECT in the  last 72 hours. Thyroid Function Tests: No results for input(s): TSH, T4TOTAL, FREET4, T3FREE, THYROIDAB in the last 72 hours. Anemia Panel: No results for input(s): VITAMINB12, FOLATE, FERRITIN, TIBC, IRON, RETICCTPCT in the last 72 hours. Sepsis Labs: No results for input(s): PROCALCITON, LATICACIDVEN in the last 168 hours.  Recent Results (from the past 240 hour(s))  Urine Culture     Status: Abnormal   Collection Time: 12/23/20  9:07 PM   Specimen: Urine, Clean Catch  Result Value Ref Range Status   Specimen Description   Final    URINE, CLEAN CATCH Performed at [facility], [address], [place], [place] [address]    Special Requests   Final    NONE Performed at [facility], [place], [place] [address]    Culture (A)  Final    40,000 COLONIES/mL STAPHYLOCOCCUS AUREUS 40,000 COLONIES/mL STREPTOCOCCUS AGALACTIAE TESTING AGAINST S. AGALACTIAE NOT ROUTINELY PERFORMED DUE TO PREDICTABILITY OF AMP/PEN/VAN SUSCEPTIBILITY. Performed at [facility], [place] [address], [place], [place] [address]    Report Status 12/26/2020 FINAL  Final   Organism ID, Bacteria STAPHYLOCOCCUS AUREUS (A)  Final      Susceptibility   Staphylococcus aureus - MIC*    CIPROFLOXACIN <=0.5 SENSITIVE Sensitive     GENTAMICIN <=0.5 SENSITIVE Sensitive     NITROFURANTOIN <=16 SENSITIVE Sensitive     OXACILLIN 0.5 SENSITIVE Sensitive     TETRACYCLINE <=1 SENSITIVE Sensitive     VANCOMYCIN <=0.5 SENSITIVE Sensitive     TRIMETH/SULFA <=10 SENSITIVE Sensitive     CLINDAMYCIN <=0.25 SENSITIVE Sensitive     RIFAMPIN <=0.5 SENSITIVE Sensitive     Inducible Clindamycin NEGATIVE Sensitive     * 40,000 COLONIES/mL STAPHYLOCOCCUS AUREUS  Wet prep, genital     Status: None   Collection Time: 12/23/20  9:10 PM  Result Value Ref Range Status   Yeast Wet Prep HPF POC NONE SEEN NONE SEEN Final   Trich, Wet Prep NONE SEEN NONE SEEN Final   Clue Cells Wet Prep  HPF POC NONE SEEN NONE SEEN Final   WBC, Wet Prep HPF POC <10 <10 Final    Comment: Please note change in reference range.   Sperm NONE SEEN  Final    Comment: Performed at [facility], [address], [place], [place] [address]  Resp Panel by RT-PCR (Flu A&B, Covid) Nasopharyngeal Swab     Status: None   Collection Time: 12/24/20  7:08 PM   Specimen: Nasopharyngeal Swab; Nasopharyngeal(NP) swabs in vial transport medium  Result Value Ref Range Status   SARS Coronavirus 2 by RT  PCR NEGATIVE NEGATIVE Final    Comment: (NOTE) SARS-CoV-2 target nucleic acids are NOT DETECTED.  The SARS-CoV-2 RNA is generally detectable in upper respiratory specimens during the acute phase of infection. The lowest concentration of SARS-CoV-2 viral copies this assay can detect is 138 copies/mL. A negative result does not preclude SARS-Cov-2 infection and should not be used as the sole basis for treatment or other patient management decisions. A negative result may occur with  improper specimen collection/handling, submission of specimen other than nasopharyngeal swab, presence of viral mutation(s) within the areas targeted by this assay, and inadequate number of viral copies(<138 copies/mL). A negative result must be combined with clinical observations, patient history, and epidemiological information. The expected result is Negative.  Fact Sheet for Patients:  EntrepreneurPulse.com.au  Fact Sheet for Healthcare Providers:  IncredibleEmployment.be  This test is no t yet approved or cleared by the [place] FDA and  has been authorized for detection and/or diagnosis of SARS-CoV-2 by FDA under an Emergency Use Authorization (EUA). This EUA will remain  in effect (meaning this test can be used) for the duration of the COVID-19 declaration under Section 564(b)(1) of the Act, 21 U.S.C.section 360bbb-3(b)(1), unless the authorization is terminated   or revoked sooner.       Influenza A by PCR NEGATIVE NEGATIVE Final   Influenza B by PCR NEGATIVE NEGATIVE Final    Comment: (NOTE) The Xpert Xpress SARS-CoV-2/FLU/RSV plus assay is intended as an aid in the diagnosis of influenza from Nasopharyngeal swab specimens and should not be used as a sole basis for treatment. Nasal washings and aspirates are unacceptable for Xpert Xpress SARS-CoV-2/FLU/RSV testing.  Fact Sheet for Patients: EntrepreneurPulse.com.au  Fact Sheet for Healthcare Providers: IncredibleEmployment.be  This test is not yet approved or cleared by the [place] FDA and has been authorized for detection and/or diagnosis of SARS-CoV-2 by FDA under an Emergency Use Authorization (EUA). This EUA will remain in effect (meaning this test can be used) for the duration of the COVID-19 declaration under Section 564(b)(1) of the Act, 21 U.S.C. section 360bbb-3(b)(1), unless the authorization is terminated or revoked.  Performed at [facility], [address] [place], [place] [address]   Culture, blood (Routine X 2) w Reflex to ID Panel     Status: None (Preliminary result)   Collection Time: 12/24/20  9:40 PM   Specimen: BLOOD  Result Value Ref Range Status   Specimen Description   Final    BLOOD Blood Culture results may not be optimal due to an excessive volume of blood received in culture bottles Performed at [facility], [address] [place], [place] [address]    Special Requests   Final    BLOOD RIGHT HAND Performed at [facility], [address] [place], [place] [address]    Culture   Final    NO GROWTH 4 DAYS Performed at [facility], [place] [address] [place], [place] [address]    Report Status PENDING  Incomplete  Culture, blood (Routine X 2) w Reflex to ID Panel     Status: None (Preliminary result)   Collection Time: 12/24/20  9:50 PM    Specimen: BLOOD  Result Value Ref Range Status   Specimen Description   Final    BLOOD Blood Culture results may not be optimal due to an excessive volume of blood received in culture bottles Performed at [facility], [address] [place], [place] [address]    Special Requests  Final    BLOOD LEFT HAND Performed at [facility], [address], [place], [place] [address]    Culture   Final    NO GROWTH 4 DAYS Performed at [facility], [place] [address], [place], [place] [address]    Report Status PENDING  Incomplete  CSF culture w Gram Stain     Status: None   Collection Time: 12/24/20 10:52 PM   Specimen: Lumbar Puncture; Cerebrospinal Fluid  Result Value Ref Range Status   Specimen Description   Final    LUMBAR Performed at [facility], [address] [place], [place] [address]    Special Requests   Final    NONE Performed at [facility], [place], [place] [address]    Gram Stain NO ORGANISMS SEEN CYTOSPIN SMEAR   Final   Culture   Final    NO GROWTH Performed at [facility], [place] [address], [place], [place] [address]    Report Status 12/28/2020 FINAL  Final  Anaerobic culture w Gram Stain     Status: None   Collection Time: 12/24/20 10:52 PM   Specimen: Lumbar Puncture; Cerebrospinal Fluid  Result Value Ref Range Status   Specimen Description   Final    LUMBAR Performed at [facility], [address] [place], [place] [address]    Special Requests   Final    NONE Performed at [facility], [place], [place] [address]    Gram Stain NO ORGANISMS SEEN CYTOSPIN SMEAR   Final   Culture   Final    NO ANAEROBES ISOLATED Performed at [facility], [place] [address], [place], [place] [address]    Report Status 12/29/2020 FINAL  Final  Culture, blood (routine x 2)     Status: None (Preliminary  result)   Collection Time: 12/25/20  3:58 AM   Specimen: BLOOD LEFT HAND  Result Value Ref Range Status   Specimen Description   Final    BLOOD LEFT HAND Performed at [facility] [address] [place], [place] [address]    Special Requests   Final    BOTTLES DRAWN AEROBIC ONLY Blood Culture results may not be optimal due to an inadequate volume of blood received in culture bottles Performed at [facility] [address] [place], [place] [address]    Culture   Final    NO GROWTH 4 DAYS Performed at [facility], [place] [address], [place], [place] [address]    Report Status PENDING  Incomplete  Culture, blood (routine x 2)     Status: None (Preliminary result)   Collection Time: 12/25/20  3:58 AM   Specimen: BLOOD LEFT ARM  Result Value Ref Range Status   Specimen Description   Final    BLOOD LEFT ARM Performed at [facility] [address], [place], [place] [address]    Special Requests   Final    BOTTLES DRAWN AEROBIC ONLY Blood Culture results may not be optimal due to an inadequate volume of blood received in culture bottles Performed at [facility] [address] [place], [place] [address]    Culture   Final    NO GROWTH 4 DAYS Performed at [facility], [place] [address], [place],  [address]    Report Status PENDING  Incomplete  Urine Culture     Status: Abnormal   Collection  Time: 12/29/20  8:16 AM   Specimen: Urine, Clean Catch  Result Value Ref Range Status   Specimen Description   Final    URINE, CLEAN CATCH Performed at [facility], [place] [address] [place], [place] [address]    Special Requests   Final    NONE Performed at [facility], [place] [address], [place], [place] [address]    Culture (A)  Final    <10,000 COLONIES/mL INSIGNIFICANT GROWTH Performed at [facility] [address], [place], [place] [address]     Report Status 12/30/2020 FINAL  Final         Radiology Studies: CT RENAL STONE STUDY  Result Date: 12/28/2020 CLINICAL DATA:  Flank pain.  Concern for kidney stone. EXAM: CT ABDOMEN AND PELVIS WITHOUT CONTRAST TECHNIQUE: Multidetector CT imaging of the abdomen and pelvis was performed following the standard protocol without IV contrast. COMPARISON:  CT abdomen pelvis dated 12/23/2020. FINDINGS: Evaluation of this exam is limited in the absence of intravenous contrast. Lower chest: Faint 6 mm subpleural nodule at the right lung base similar to prior CT. Follow-up as per recommendation of the prior CT. The visualized lung bases are otherwise clear. There is coronary vascular calcification. No intra-abdominal free air or free fluid. Hepatobiliary: Fatty liver. No intrahepatic biliary dilatation. The gallbladder is unremarkable. Pancreas: Unremarkable. No pancreatic ductal dilatation or surrounding inflammatory changes. Spleen: Splenectomy. Probable small splenule or residual splenic tissue in the left upper abdomen. Adrenals/Urinary Tract: The adrenal glands are unremarkable. Left renal inferior pole calculus measures approximately 11 mm similar to prior CT. There is minimal fullness of the left renal collecting system without frank hydronephrosis. No obstructing stone identified. There is no hydronephrosis or nephrolithiasis on the right. The visualized ureters and urinary bladder appear unremarkable. Stomach/Bowel: There is moderate stool throughout the colon. No bowel obstruction or active inflammation. The appendix is normal. Vascular/Lymphatic: Mild aortoiliac atherosclerotic disease. The IVC is unremarkable. No portal venous gas. There is no adenopathy. Reproductive: Hysterectomy.  No adnexal masses. Other: Small fat containing umbilical hernia. Musculoskeletal: No acute osseous pathology. IMPRESSION: 1. No acute intra-abdominal or pelvic pathology. 2. An 11 mm left renal inferior pole calculus  similar to prior CT. No hydronephrosis or obstructing stone. 3. Fatty liver. 4. Aortic Atherosclerosis (ICD10-I70.0). Electronically Signed   By: Anner Crete M.D.   On: 12/28/2020 21:49        Scheduled Meds:  atorvastatin  40 mg Oral Daily   busPIRone  5 mg Oral TID   diphenhydrAMINE  12.5 mg Intravenous Q12H   escitalopram  20 mg Oral Daily   gabapentin  300 mg Oral TID   insulin aspart  0-15 Units Subcutaneous TID WC   insulin glargine-yfgn  12 Units Subcutaneous Daily   lidocaine  1 patch Transdermal Q24H   lisinopril  20 mg Oral Daily   sodium chloride flush  3 mL Intravenous Q12H   Continuous Infusions:   ceFAZolin (ANCEF) IV 2 g (12/30/20 0333)     LOS: 5 days   Time spent= 35 mins    Lester Crickenberger Arsenio Loader, MD [facility]  If 7PM-7AM, please contact night-coverage  12/30/2020, 9:06 AM

## 2020-12-31 LAB — CBC
HCT: 41.1 % (ref 36.0–46.0)
Hemoglobin: 13.9 g/dL (ref 12.0–15.0)
MCH: 31.6 pg (ref 26.0–34.0)
MCHC: 33.8 g/dL (ref 30.0–36.0)
MCV: 93.4 fL (ref 80.0–100.0)
Platelets: 331 10*3/uL (ref 150–400)
RBC: 4.4 MIL/uL (ref 3.87–5.11)
RDW: 14.6 % (ref 11.5–15.5)
WBC: 20.4 10*3/uL — ABNORMAL HIGH (ref 4.0–10.5)
nRBC: 0 % (ref 0.0–0.2)

## 2020-12-31 LAB — GLUCOSE, CAPILLARY
Glucose-Capillary: 196 mg/dL — ABNORMAL HIGH (ref 70–99)
Glucose-Capillary: 171 mg/dL — ABNORMAL HIGH (ref 70–99)
Glucose-Capillary: 175 mg/dL — ABNORMAL HIGH (ref 70–99)
Glucose-Capillary: 175 mg/dL — ABNORMAL HIGH (ref 70–99)

## 2020-12-31 LAB — BASIC METABOLIC PANEL
Anion gap: 10 (ref 5–15)
BUN: 16 mg/dL (ref 6–20)
CO2: 22 mmol/L (ref 22–32)
Calcium: 10.2 mg/dL (ref 8.9–10.3)
Chloride: 107 mmol/L (ref 98–111)
Creatinine, Ser: 0.62 mg/dL (ref 0.44–1.00)
GFR, Estimated: >60 mL/min (ref >60)
Glucose, Bld: 238 mg/dL — ABNORMAL HIGH (ref 70–99)
Potassium: 3.6 mmol/L (ref 3.5–5.1)
Sodium: 139 mmol/L (ref 135–145)

## 2020-12-31 LAB — MAGNESIUM: Magnesium: 1.5 mg/dL — ABNORMAL LOW (ref 1.7–2.4)

## 2020-12-31 MED ORDER — INSULIN GLARGINE-YFGN 100 UNIT/ML ~~LOC~~ SOLN
4.0000 [IU] | Freq: Once | SUBCUTANEOUS | Status: AC
Start: 2020-12-31 — End: 2020-12-31
  Administered 2020-12-31: 4 [IU] via SUBCUTANEOUS
  Filled 2020-12-31: qty 0.04

## 2020-12-31 MED ORDER — INSULIN GLARGINE-YFGN 100 UNIT/ML ~~LOC~~ SOLN
20.0000 [IU] | Freq: Every day | SUBCUTANEOUS | Status: DC
  Filled 2020-12-31: qty 0.2

## 2020-12-31 MED ORDER — SODIUM CHLORIDE 0.9 % IV SOLN: INTRAVENOUS | Status: DC | PRN

## 2020-12-31 MED ORDER — HYDROMORPHONE HCL 1 MG/ML IJ SOLN
1.0000 mg | Freq: Once | INTRAMUSCULAR | Status: AC
  Administered 2020-12-31: 1 mg via INTRAVENOUS
  Filled 2020-12-31: qty 1

## 2020-12-31 MED ORDER — HYDROMORPHONE HCL 1 MG/ML IJ SOLN
0.5000 mg | Freq: Once | INTRAMUSCULAR | Status: AC
  Administered 2020-12-31: 0.5 mg via INTRAVENOUS
  Filled 2020-12-31: qty 0.5

## 2020-12-31 MED ORDER — MAGNESIUM SULFATE 2 GM/50ML IV SOLN
2.0000 g | Freq: Once | INTRAVENOUS | Status: AC
  Administered 2020-12-31: 2 g via INTRAVENOUS
  Filled 2020-12-31: qty 50

## 2020-12-31 MED ORDER — INSULIN GLARGINE-YFGN 100 UNIT/ML ~~LOC~~ SOLN
20.0000 [IU] | Freq: Every day | SUBCUTANEOUS | Status: DC
  Administered 2021-01-01: 20 [IU] via SUBCUTANEOUS
  Filled 2020-12-31: qty 0.2

## 2020-12-31 NOTE — Progress Notes (Signed)
   12/31/20 1424  Provider Notification  Provider Name/Title Dr. Reesa Chew  Date Provider Notified 12/31/20  Time Provider Notified 1425  Notification Type Page  Notification Reason Change in status (increased pain)  Provider response See new orders  Date of Provider Response 12/31/20  Time of Provider Response 1426  Pt c/o 9/10 constant lower back pain after receiving PO Dilaudid and Robaxin approximately 2 hours ago. Dr. Reesa Chew made aware. New orders received.

## 2020-12-31 NOTE — Plan of Care (Signed)
  Problem: Coping: Goal: Level of anxiety will decrease Outcome: Progressing   Problem: Pain Managment: Goal: General experience of comfort will improve Outcome: Progressing   Problem: Safety: Goal: Ability to remain free from injury will improve Outcome: Progressing   Problem: Skin Integrity: Goal: Risk for impaired skin integrity will decrease Outcome: Progressing   

## 2020-12-31 NOTE — Progress Notes (Signed)
PROGRESS NOTE    Jamie Conley  KKX:[id number] DOB: 04/23/1975 DOA: 12/24/2020 PCP: Pcp, No   Brief Narrative:  45 year old with history of anxiety, depression, cancer, DM2, HLD, HTN presenting with headaches, neck stiffness, photophobia.  Underwent lumbar puncture in the ER, empirically initially was started on IV acyclovir which was eventually discontinued.  Eventually rest of the antibacterials were discontinued as well once the CSF was negative.  Patient also received headache cocktail with minimal relief, neurology and cardiology were consulted.  Patient was seen by cardiology due to possible syncopal event.  MRI/MRV both were negative and started on DHE trial and then gabapentin.  Also developed dysuria type symptoms, UA was positive.   Assessment & Plan:   Principal Problem:   Headache Active Problems:   Depression   Hypertension   Migraine   Anxiety   Leukocytosis   Hyperlipidemia   Type 2 diabetes mellitus with hyperglycemia (HCC)   Hypokalemia   Syncopal episodes   Hepatic steatosis   Aortic atherosclerosis (HCC)   Status migrainosus   Postconcussive syndrome    Intractable headache likely migraine and/status migrainosus, persistent  -Underwent extensive work-up including lumbar puncture, MRI/MRV, CTA head and neck.  All of this has been negative.  Initially was on antiviral and anti-infectives which were all discontinued now. - Neurology team is following - Current medications-Benadryl, gabapentin  Acute syncopal episode in the setting of migraine -This is now resolved.  Echocardiogram during hospitalization was normal EF, grade 2 diastolic dysfunction.  Patient was seen by cardiology, no further work-up at this time   Essential hypertension - Lisinopril daily.  IV hydralazine and Lopressor as needed   Leukocytosis, worsening -Likely reactive and worsened in the setting of steroids but now could be in the setting of infection from urinary tract  infection. Abx may need to be changed, will monitor fever curve.    Back pain -As needed pain medications   Hematuria in the setting of staff aureus and Streptococcus agalactiae UTI, present on admission CT shown nonobstructive renal stone which was discussed with urology by previous provider who recommends outpatient follow-up and treatment for urinary tract infection - Cultures have grown Streptococcus, continue IV Ancef  Rectal bleeding and diarrhea -GI panel, Hemoccult?-Pending -Hemoglobin remained stable    Uncontrolled Diabetes Mellitus Type 2, from hyperglycemia -hemoglobin A1c 11.4.  Increase Semglee 20 units daily.  Sliding scale and Accu-Cheks   Anxiety and Depression -Seen by psychiatry.  Recommending BuSpar 5 mg 3 times daily,Lexapro 20 mg 3 times daily.  As needed Xanax   Hepatic steatosis -Follow-up outpatient   Metabolic Acidosis -Resolved   Hyperlipidemia -Lipitor 40 mg daily   Hypokalemia.   -Replete p.o.   Abdominal wound -Wound care nurse consulted for further evaluation   Obesity -Complicates overall prognosis and care -Estimated body mass index is 37.34 kg/m as calculated from the following:   Height as of this encounter: 5\' 3"  (1.6 m).   Weight as of this encounter: 95.6 kg. -Weight Loss and Dietary Counseling given      DVT prophylaxis: SCDs Code Status: Full code Family Communication:    Status is: Inpatient  Remains inpatient appropriate because: Due to worsening leukocytosis, she needs to remain in the hospital for further monitoring and evaluation.       Subjective: Overall patient tells me her headache is little better today but had a little bit of blood in her stool which she thinks it might be her hemorrhoids.  Otherwise she is feeling okay.  She also attempted to call urology office in [facility] and tells me that they are not able to send records until at least Monday  Review of Systems Otherwise negative except as  per HPI, including: General: Denies fever, chills, night sweats or unintended weight loss. Resp: Denies cough, wheezing, shortness of breath. Cardiac: Denies chest pain, palpitations, orthopnea, paroxysmal nocturnal dyspnea. GI: Denies abdominal pain, nausea, vomiting, diarrhea or constipation GU: Denies dysuria, frequency, hesitancy or incontinence MS: Denies muscle aches, joint pain or swelling Neuro: Denies headache, neurologic deficits (focal weakness, numbness, tingling), abnormal gait Psych: Denies anxiety, depression, SI/HI/AVH Skin: Denies new rashes or lesions ID: Denies sick contacts, exotic exposures, travel  Examination: Constitutional: Not in acute distress Respiratory: Clear to auscultation bilaterally Cardiovascular: Normal sinus rhythm, no rubs Abdomen: Nontender nondistended good bowel sounds Musculoskeletal: No edema noted Skin: No rashes seen Neurologic: CN 2-12 grossly intact.  And nonfocal Psychiatric: Normal judgment and insight. Alert and oriented x 3. Normal mood.  Objective: Vitals:   12/30/20 1354 12/30/20 1940 12/31/20 0500 12/31/20 0546  BP: (!) 158/97 139/87  123/88  Pulse: 84 91  73  Resp: 18 16  17   Temp:  98.1 F (36.7 C)  98.4 F (36.9 C)  TempSrc:  Oral  Oral  SpO2: 100% 100%  100%  Weight:   94 kg   Height:        Intake/Output Summary (Last 24 hours) at 12/31/2020 0829 Last data filed at 12/31/2020 0600 Gross per 24 hour  Intake 1723 ml  Output 400 ml  Net 1323 ml   Filed Weights   12/28/20 0500 12/29/20 1013 12/31/20 0500  Weight: 93.3 kg 95.6 kg 94 kg     Data Reviewed:   CBC: Recent Labs  Lab 12/24/20 1908 12/26/20 0457 12/27/20 0506 12/28/20 0436 12/29/20 0425 12/30/20 0426 12/31/20 0503  WBC 14.0*   < > 15.3* 17.7* 14.0* 14.3* 20.4*  NEUTROABS 6.9  --  7.5 9.2* 4.7 4.5  --   HGB 14.2   < > 14.3 13.0 12.7 13.2 13.9  HCT 42.6   < > 43.2 38.7 37.9 41.2 41.1  MCV 92.6   < > 93.5 92.1 93.1 99.0 93.4  PLT 527*   < >  517* 470* 455* 439* 331   < > = values in this interval not displayed.   Basic Metabolic Panel: Recent Labs  Lab 12/27/20 0506 12/28/20 0436 12/29/20 0425 12/30/20 0426 12/31/20 0503  NA 140 140 139 137 139  K 3.8 3.1* 3.8 3.5 3.6  CL 114* 111 111 105 107  CO2 18* 22 22 25 22   GLUCOSE 180* 241* 209* 331* 238*  BUN 13 12 16 14 16   CREATININE 0.50 0.60 0.57 0.62 0.62  CALCIUM 9.2 8.9 9.0 9.2 10.2  MG 2.0 1.8 1.9 1.7 1.5*  PHOS 3.7 3.5 5.0* 4.8*  --    GFR: Estimated Creatinine Clearance: 96.7 mL/min (by C-G formula based on SCr of 0.62 mg/dL). Liver Function Tests: Recent Labs  Lab 12/27/20 0506 12/28/20 0436 12/29/20 0425 12/30/20 0426  AST 12* 12* 11* 16  ALT 17 14 14 12   ALKPHOS 84 75 76 80  BILITOT 0.6 0.5 0.8 0.4  PROT 6.9 6.4* 6.7 6.9  ALBUMIN 3.4* 3.3* 3.4* 3.5   No results for input(s): LIPASE, AMYLASE in the last 168 hours. No results for input(s): AMMONIA in the last 168 hours. Coagulation Profile: No results for input(s): INR, PROTIME in the last 168 hours. Cardiac Enzymes: No  results for input(s): CKTOTAL, CKMB, CKMBINDEX, TROPONINI in the last 168 hours. BNP (last 3 results) No results for input(s): PROBNP in the last 8760 hours. HbA1C: No results for input(s): HGBA1C in the last 72 hours. CBG: Recent Labs  Lab 12/30/20 0745 12/30/20 1146 12/30/20 1637 12/30/20 2138 12/31/20 0719  GLUCAP 261* 299* 300* 199* 196*   Lipid Profile: No results for input(s): CHOL, HDL, LDLCALC, TRIG, CHOLHDL, LDLDIRECT in the last 72 hours. Thyroid Function Tests: No results for input(s): TSH, T4TOTAL, FREET4, T3FREE, THYROIDAB in the last 72 hours. Anemia Panel: No results for input(s): VITAMINB12, FOLATE, FERRITIN, TIBC, IRON, RETICCTPCT in the last 72 hours. Sepsis Labs: No results for input(s): PROCALCITON, LATICACIDVEN in the last 168 hours.  Recent Results (from the past 240 hour(s))  Urine Culture     Status: Abnormal   Collection Time: 12/23/20  9:07  PM   Specimen: Urine, Clean Catch  Result Value Ref Range Status   Specimen Description   Final    URINE, CLEAN CATCH Performed at [facility], [address], [place], [place] [address]    Special Requests   Final    NONE Performed at [facility], [place], [place] [address]    Culture (A)  Final    40,000 COLONIES/mL STAPHYLOCOCCUS AUREUS 40,000 COLONIES/mL STREPTOCOCCUS AGALACTIAE TESTING AGAINST S. AGALACTIAE NOT ROUTINELY PERFORMED DUE TO PREDICTABILITY OF AMP/PEN/VAN SUSCEPTIBILITY. Performed at [facility], [place] [address] [place], [place] [address]    Report Status 12/26/2020 FINAL  Final   Organism ID, Bacteria STAPHYLOCOCCUS AUREUS (A)  Final      Susceptibility   Staphylococcus aureus - MIC*    CIPROFLOXACIN <=0.5 SENSITIVE Sensitive     GENTAMICIN <=0.5 SENSITIVE Sensitive     NITROFURANTOIN <=16 SENSITIVE Sensitive     OXACILLIN 0.5 SENSITIVE Sensitive     TETRACYCLINE <=1 SENSITIVE Sensitive     VANCOMYCIN <=0.5 SENSITIVE Sensitive     TRIMETH/SULFA <=10 SENSITIVE Sensitive     CLINDAMYCIN <=0.25 SENSITIVE Sensitive     RIFAMPIN <=0.5 SENSITIVE Sensitive     Inducible Clindamycin NEGATIVE Sensitive     * 40,000 COLONIES/mL STAPHYLOCOCCUS AUREUS  Wet prep, genital     Status: None   Collection Time: 12/23/20  9:10 PM  Result Value Ref Range Status   Yeast Wet Prep HPF POC NONE SEEN NONE SEEN Final   Trich, Wet Prep NONE SEEN NONE SEEN Final   Clue Cells Wet Prep HPF POC NONE SEEN NONE SEEN Final   WBC, Wet Prep HPF POC <10 <10 Final    Comment: Please note change in reference range.   Sperm NONE SEEN  Final    Comment: Performed at [facility], [address] [place], [place] [address]  Resp Panel by RT-PCR (Flu A&B, Covid) Nasopharyngeal Swab     Status: None   Collection Time: 12/24/20  7:08 PM   Specimen: Nasopharyngeal Swab; Nasopharyngeal(NP) swabs in vial transport  medium  Result Value Ref Range Status   SARS Coronavirus 2 by RT PCR NEGATIVE NEGATIVE Final    Comment: (NOTE) SARS-CoV-2 target nucleic acids are NOT DETECTED.  The SARS-CoV-2 RNA is generally detectable in upper respiratory specimens during the acute phase of infection. The lowest concentration of SARS-CoV-2 viral copies this assay can detect is 138 copies/mL. A negative result does not preclude SARS-Cov-2 infection and should not be used as the sole basis for treatment or other patient management decisions. A negative result  may occur with  improper specimen collection/handling, submission of specimen other than nasopharyngeal swab, presence of viral mutation(s) within the areas targeted by this assay, and inadequate number of viral copies(<138 copies/mL). A negative result must be combined with clinical observations, patient history, and epidemiological information. The expected result is Negative.  Fact Sheet for Patients:  EntrepreneurPulse.com.au  Fact Sheet for Healthcare Providers:  IncredibleEmployment.be  This test is no t yet approved or cleared by the [place] FDA and  has been authorized for detection and/or diagnosis of SARS-CoV-2 by FDA under an Emergency Use Authorization (EUA). This EUA will remain  in effect (meaning this test can be used) for the duration of the COVID-19 declaration under Section 564(b)(1) of the Act, 21 U.S.C.section 360bbb-3(b)(1), unless the authorization is terminated  or revoked sooner.       Influenza A by PCR NEGATIVE NEGATIVE Final   Influenza B by PCR NEGATIVE NEGATIVE Final    Comment: (NOTE) The Xpert Xpress SARS-CoV-2/FLU/RSV plus assay is intended as an aid in the diagnosis of influenza from Nasopharyngeal swab specimens and should not be used as a sole basis for treatment. Nasal washings and aspirates are unacceptable for Xpert Xpress SARS-CoV-2/FLU/RSV testing.  Fact Sheet for  Patients: EntrepreneurPulse.com.au  Fact Sheet for Healthcare Providers: IncredibleEmployment.be  This test is not yet approved or cleared by the [place] FDA and has been authorized for detection and/or diagnosis of SARS-CoV-2 by FDA under an Emergency Use Authorization (EUA). This EUA will remain in effect (meaning this test can be used) for the duration of the COVID-19 declaration under Section 564(b)(1) of the Act, 21 U.S.C. section 360bbb-3(b)(1), unless the authorization is terminated or revoked.  Performed at [facility], [address], [place], [place] [address]   Culture, blood (Routine X 2) w Reflex to ID Panel     Status: None (Preliminary result)   Collection Time: 12/24/20  9:40 PM   Specimen: BLOOD  Result Value Ref Range Status   Specimen Description   Final    BLOOD Blood Culture results may not be optimal due to an excessive volume of blood received in culture bottles Performed at [facility], [address], [place], [place] [address]    Special Requests   Final    BLOOD RIGHT HAND Performed at [facility], [address], [place], [place] [address]    Culture   Final    NO GROWTH 4 DAYS Performed at [facility], [place] [address], [place], [place] [address]    Report Status PENDING  Incomplete  Culture, blood (Routine X 2) w Reflex to ID Panel     Status: None (Preliminary result)   Collection Time: 12/24/20  9:50 PM   Specimen: BLOOD  Result Value Ref Range Status   Specimen Description   Final    BLOOD Blood Culture results may not be optimal due to an excessive volume of blood received in culture bottles Performed at [facility], [address], [place], [place] [address]    Special Requests   Final    BLOOD LEFT HAND Performed at [facility], [address] [place], [place] [address]    Culture    Final    NO GROWTH 4 DAYS Performed at [facility], [place] [address] [place], [place] [address]    Report Status PENDING  Incomplete  CSF culture w Gram Stain     Status: None   Collection Time: 12/24/20 10:52  PM   Specimen: Lumbar Puncture; Cerebrospinal Fluid  Result Value Ref Range Status   Specimen Description   Final    LUMBAR Performed at [facility], [address] [place], [place] [address]    Special Requests   Final    NONE Performed at [facility], [place], [place] [address]    Gram Stain NO ORGANISMS SEEN CYTOSPIN SMEAR   Final   Culture   Final    NO GROWTH Performed at [facility], [address] [place], [place] [address]    Report Status 12/28/2020 FINAL  Final  Anaerobic culture w Gram Stain     Status: None   Collection Time: 12/24/20 10:52 PM   Specimen: Lumbar Puncture; Cerebrospinal Fluid  Result Value Ref Range Status   Specimen Description   Final    LUMBAR Performed at [facility], [address], [place], [place] [address]    Special Requests   Final    NONE Performed at [facility], [place], [place] [address]    Gram Stain NO ORGANISMS SEEN CYTOSPIN SMEAR   Final   Culture   Final    NO ANAEROBES ISOLATED Performed at [facility], [place] [address] [place], [place] [address]    Report Status 12/29/2020 FINAL  Final  Culture, blood (routine x 2)     Status: None   Collection Time: 12/25/20  3:58 AM   Specimen: BLOOD LEFT HAND  Result Value Ref Range Status   Specimen Description   Final    BLOOD LEFT HAND Performed at [facility] [address] [place], [place] [address]    Special Requests   Final    BOTTLES DRAWN AEROBIC ONLY Blood Culture results may not be optimal due to an inadequate volume of blood received in culture bottles Performed at [facility] [address], [place], [place] [address]    Culture   Final    NO GROWTH 5 DAYS Performed at [facility], [place] [address] [place], [place] [address]    Report Status 12/30/2020 FINAL  Final  Culture, blood (routine x 2)     Status: None   Collection Time: 12/25/20  3:58 AM   Specimen: BLOOD LEFT ARM  Result Value Ref Range Status   Specimen Description   Final    BLOOD LEFT ARM Performed at [facility] [address], [place], [place] [address]    Special Requests   Final    BOTTLES DRAWN AEROBIC ONLY Blood Culture results may not be optimal due to an inadequate volume of blood received in culture bottles Performed at [facility] [address] [place], [place] [address]    Culture   Final    NO GROWTH 5 DAYS Performed at [facility], [place] [address], [place], [place] [address]    Report Status 12/30/2020 FINAL  Final  Urine Culture     Status: Abnormal   Collection Time: 12/29/20  8:16 AM   Specimen: Urine, Clean Catch  Result Value Ref Range Status   Specimen Description   Final    URINE, CLEAN CATCH Performed at [facility], [place] [address] [place],  [address]    Special Requests   Final    NONE Performed at [facility], [place] [address] [place],  [address]    Culture (A)  Final    <10,000 COLONIES/mL INSIGNIFICANT GROWTH Performed at [facility] [address], [place], [place] [address]    Report Status 12/30/2020 FINAL  Final         Radiology Studies: No results found.      Scheduled Meds:  atorvastatin  40 mg Oral Daily   busPIRone  5 mg Oral TID   diphenhydrAMINE  12.5 mg Intravenous Q12H   docusate sodium  100 mg Oral BID   escitalopram  20 mg Oral Daily   gabapentin  300 mg Oral TID   insulin aspart  0-15 Units Subcutaneous TID WC   insulin glargine-yfgn  16 Units Subcutaneous Daily   lidocaine  1 patch Transdermal Q24H    lisinopril  20 mg Oral Daily   sodium chloride flush  3 mL Intravenous Q12H   Continuous Infusions:   ceFAZolin (ANCEF) IV 2 g (12/31/20 0420)     LOS: 6 days   Time spent= 35 mins    Emidio Warrell Arsenio Loader, MD [facility]  If 7PM-7AM, please contact night-coverage  12/31/2020, 8:29 AM

## 2020-12-31 NOTE — Plan of Care (Signed)
  Problem: Health Behavior/Discharge Planning: Goal: Ability to manage health-related needs will improve Outcome: Progressing   Problem: Clinical Measurements: Goal: Ability to maintain clinical measurements within normal limits will improve Outcome: Progressing Goal: Will remain free from infection Outcome: Progressing Goal: Diagnostic test results will improve Outcome: Progressing   Problem: Nutrition: Goal: Adequate nutrition will be maintained Outcome: Progressing   Problem: Coping: Goal: Level of anxiety will decrease Outcome: Progressing   Problem: Pain Managment: Goal: General experience of comfort will improve Outcome: Progressing   Problem: Safety: Goal: Ability to remain free from injury will improve Outcome: Progressing   Problem: Skin Integrity: Goal: Risk for impaired skin integrity will decrease Outcome: Adequate for Discharge

## 2020-12-31 NOTE — Progress Notes (Signed)
Chaplain received a consult and a page to see patient who was requesting prayer.  Jamie Conley shared some of the stressors in her life including caring for her mother and her two sons (one of whom has Autism and the other has severe mental illness).  She shared about some of the things that she has gone through with her son's illness and requested prayer which Chaplain provided.  Moran, Bcc Pager, [phone] 8:30 PM

## 2021-01-01 ENCOUNTER — Other Ambulatory Visit (HOSPITAL_COMMUNITY): Payer: Self-pay

## 2021-01-01 LAB — CBC
HCT: 40 % (ref 36.0–46.0)
Hemoglobin: 13.3 g/dL (ref 12.0–15.0)
MCH: 31.5 pg (ref 26.0–34.0)
MCHC: 33.3 g/dL (ref 30.0–36.0)
MCV: 94.8 fL (ref 80.0–100.0)
Platelets: 484 10*3/uL — ABNORMAL HIGH (ref 150–400)
RBC: 4.22 MIL/uL (ref 3.87–5.11)
RDW: 14.6 % (ref 11.5–15.5)
WBC: 18.6 10*3/uL — ABNORMAL HIGH (ref 4.0–10.5)
nRBC: 0 % (ref 0.0–0.2)

## 2021-01-01 LAB — BASIC METABOLIC PANEL
Anion gap: 7 (ref 5–15)
BUN: 26 mg/dL — ABNORMAL HIGH (ref 6–20)
CO2: 25 mmol/L (ref 22–32)
Calcium: 9 mg/dL (ref 8.9–10.3)
Chloride: 105 mmol/L (ref 98–111)
Creatinine, Ser: 0.66 mg/dL (ref 0.44–1.00)
GFR, Estimated: >60 mL/min (ref >60)
Glucose, Bld: 261 mg/dL — ABNORMAL HIGH (ref 70–99)
Potassium: 3.4 mmol/L — ABNORMAL LOW (ref 3.5–5.1)
Sodium: 137 mmol/L (ref 135–145)

## 2021-01-01 LAB — GLUCOSE, CAPILLARY
Glucose-Capillary: 207 mg/dL — ABNORMAL HIGH (ref 70–99)
Glucose-Capillary: 156 mg/dL — ABNORMAL HIGH (ref 70–99)

## 2021-01-01 LAB — MAGNESIUM: Magnesium: 1.6 mg/dL — ABNORMAL LOW (ref 1.7–2.4)

## 2021-01-01 MED ORDER — BLOOD GLUCOSE MONITOR KIT: PACK | 0 refills | Status: AC

## 2021-01-01 MED ORDER — TRAZODONE HCL 50 MG PO TABS: 50.0000 mg | ORAL_TABLET | Freq: Every evening | ORAL | 0 refills | Status: DC | PRN

## 2021-01-01 MED ORDER — ATORVASTATIN CALCIUM 40 MG PO TABS
40.0000 mg | ORAL_TABLET | Freq: Every day | ORAL | 0 refills | Status: DC
  Filled 2021-01-01: qty 30, 30d supply, fill #0

## 2021-01-01 MED ORDER — ATORVASTATIN CALCIUM 40 MG PO TABS
40.0000 mg | ORAL_TABLET | Freq: Every day | ORAL | 0 refills | Status: AC
Start: 2021-01-01 — End: ?

## 2021-01-01 MED ORDER — SENNOSIDES-DOCUSATE SODIUM 8.6-50 MG PO TABS: 1.0000 | ORAL_TABLET | Freq: Every evening | ORAL | 0 refills | Status: DC | PRN

## 2021-01-01 MED ORDER — LISINOPRIL 20 MG PO TABS: 20.0000 mg | ORAL_TABLET | Freq: Every day | ORAL | 0 refills | Status: DC

## 2021-01-01 MED ORDER — HYDROMORPHONE HCL 2 MG PO TABS: 2.0000 mg | ORAL_TABLET | Freq: Four times a day (QID) | ORAL | 0 refills | Status: DC | PRN

## 2021-01-01 MED ORDER — ALPRAZOLAM 1 MG PO TABS: 1.0000 mg | ORAL_TABLET | Freq: Every day | ORAL | 0 refills | Status: DC | PRN

## 2021-01-01 MED ORDER — "PEN NEEDLES 3/16"" 31G X 5 MM MISC"
100.0000 | Freq: Three times a day (TID) | 0 refills | Status: DC
  Filled 2021-01-01: qty 100, fill #0

## 2021-01-01 MED ORDER — INSULIN STARTER KIT- PEN NEEDLES (ENGLISH)
1.0000 | Freq: Once | Status: DC
  Filled 2021-01-01: qty 1

## 2021-01-01 MED ORDER — "PEN NEEDLES 3/16"" 31G X 5 MM MISC": 100.0000 | Freq: Three times a day (TID) | 0 refills | Status: DC

## 2021-01-01 MED ORDER — INSULIN STARTER KIT- PEN NEEDLES (ENGLISH): 1.0000 | Freq: Once | 0 refills | Status: DC

## 2021-01-01 MED ORDER — POTASSIUM CHLORIDE CRYS ER 20 MEQ PO TBCR
40.0000 meq | EXTENDED_RELEASE_TABLET | ORAL | Status: DC
  Administered 2021-01-01: 40 meq via ORAL
  Filled 2021-01-01: qty 2

## 2021-01-01 MED ORDER — METHOCARBAMOL 500 MG PO TABS: 500.0000 mg | ORAL_TABLET | Freq: Four times a day (QID) | ORAL | 0 refills | Status: DC | PRN

## 2021-01-01 MED ORDER — METFORMIN HCL 1000 MG PO TABS
1000.0000 mg | ORAL_TABLET | Freq: Two times a day (BID) | ORAL | 0 refills | Status: DC
  Filled 2021-01-01: qty 60, 30d supply, fill #0

## 2021-01-01 MED ORDER — LISINOPRIL 20 MG PO TABS
20.0000 mg | ORAL_TABLET | Freq: Every day | ORAL | 0 refills | Status: DC
  Filled 2021-01-01: qty 30, 30d supply, fill #0

## 2021-01-01 MED ORDER — ESCITALOPRAM OXALATE 20 MG PO TABS
20.0000 mg | ORAL_TABLET | Freq: Every day | ORAL | 0 refills | Status: DC
  Filled 2021-01-01: qty 30, 30d supply, fill #0

## 2021-01-01 MED ORDER — TAMSULOSIN HCL 0.4 MG PO CAPS: 0.4000 mg | ORAL_CAPSULE | Freq: Every day | ORAL | Status: DC

## 2021-01-01 MED ORDER — BUSPIRONE HCL 5 MG PO TABS: 5.0000 mg | ORAL_TABLET | Freq: Three times a day (TID) | ORAL | 0 refills | Status: AC

## 2021-01-01 MED ORDER — TRAZODONE HCL 50 MG PO TABS
50.0000 mg | ORAL_TABLET | Freq: Every evening | ORAL | 0 refills | Status: DC | PRN
Start: 2021-01-01 — End: 2021-01-01
  Filled 2021-01-01: qty 30, 30d supply, fill #0

## 2021-01-01 MED ORDER — GABAPENTIN 300 MG PO CAPS: 300.0000 mg | ORAL_CAPSULE | Freq: Three times a day (TID) | ORAL | 0 refills | Status: DC

## 2021-01-01 MED ORDER — NOVOLIN 70/30 FLEXPEN RELION (70-30) 100 UNIT/ML ~~LOC~~ SUPN
18.0000 [IU] | PEN_INJECTOR | Freq: Two times a day (BID) | SUBCUTANEOUS | 0 refills | Status: DC
Start: 2021-01-01 — End: 2021-02-19

## 2021-01-01 MED ORDER — NOVOLIN 70/30 FLEXPEN RELION (70-30) 100 UNIT/ML ~~LOC~~ SUPN
18.0000 [IU] | PEN_INJECTOR | Freq: Two times a day (BID) | SUBCUTANEOUS | 0 refills | Status: DC
  Filled 2021-01-01: qty 10.8, 30d supply, fill #0

## 2021-01-01 MED ORDER — CEPHALEXIN 500 MG PO CAPS: 500.0000 mg | ORAL_CAPSULE | Freq: Four times a day (QID) | ORAL | 0 refills | Status: AC

## 2021-01-01 MED ORDER — METHOCARBAMOL 500 MG PO TABS
500.0000 mg | ORAL_TABLET | Freq: Four times a day (QID) | ORAL | 0 refills | Status: DC | PRN
  Filled 2021-01-01: qty 10, 3d supply, fill #0

## 2021-01-01 MED ORDER — ALPRAZOLAM 1 MG PO TABS
1.0000 mg | ORAL_TABLET | Freq: Every day | ORAL | 0 refills | Status: DC | PRN
  Filled 2021-01-01: qty 10, 10d supply, fill #0

## 2021-01-01 MED ORDER — INSULIN STARTER KIT- PEN NEEDLES (ENGLISH): 1.0000 | Freq: Once | 0 refills | Status: AC

## 2021-01-01 MED ORDER — GABAPENTIN 300 MG PO CAPS
300.0000 mg | ORAL_CAPSULE | Freq: Three times a day (TID) | ORAL | 0 refills | Status: DC
  Filled 2021-01-01: qty 90, 30d supply, fill #0

## 2021-01-01 MED ORDER — MAGNESIUM OXIDE -MG SUPPLEMENT 400 (240 MG) MG PO TABS
800.0000 mg | ORAL_TABLET | ORAL | Status: DC
  Administered 2021-01-01: 800 mg via ORAL
  Filled 2021-01-01: qty 2

## 2021-01-01 MED ORDER — BUSPIRONE HCL 5 MG PO TABS
5.0000 mg | ORAL_TABLET | Freq: Three times a day (TID) | ORAL | 0 refills | Status: DC
  Filled 2021-01-01: qty 90, 30d supply, fill #0

## 2021-01-01 MED ORDER — SENNOSIDES-DOCUSATE SODIUM 8.6-50 MG PO TABS
1.0000 | ORAL_TABLET | Freq: Every evening | ORAL | 0 refills | Status: DC | PRN
  Filled 2021-01-01: qty 30, 30d supply, fill #0

## 2021-01-01 MED ORDER — TAMSULOSIN HCL 0.4 MG PO CAPS
0.4000 mg | ORAL_CAPSULE | Freq: Every day | ORAL | 0 refills | Status: DC
  Filled 2021-01-01: qty 30, 30d supply, fill #0

## 2021-01-01 MED ORDER — CEPHALEXIN 500 MG PO CAPS
500.0000 mg | ORAL_CAPSULE | Freq: Four times a day (QID) | ORAL | 0 refills | Status: DC
  Filled 2021-01-01: qty 28, 7d supply, fill #0

## 2021-01-01 MED ORDER — BLOOD GLUCOSE MONITOR KIT
PACK | 0 refills | Status: DC
  Filled 2021-01-01: qty 1, fill #0

## 2021-01-01 MED ORDER — TAMSULOSIN HCL 0.4 MG PO CAPS: 0.4000 mg | ORAL_CAPSULE | Freq: Every day | ORAL | 0 refills | Status: DC

## 2021-01-01 MED ORDER — HYDROMORPHONE HCL 2 MG PO TABS
2.0000 mg | ORAL_TABLET | Freq: Four times a day (QID) | ORAL | 0 refills | Status: DC | PRN
  Filled 2021-01-01: qty 15, 4d supply, fill #0

## 2021-01-01 MED ORDER — ESCITALOPRAM OXALATE 20 MG PO TABS: 20.0000 mg | ORAL_TABLET | Freq: Every day | ORAL | 0 refills | Status: DC

## 2021-01-01 MED ORDER — METFORMIN HCL 1000 MG PO TABS: 1000.0000 mg | ORAL_TABLET | Freq: Two times a day (BID) | ORAL | 0 refills | Status: DC

## 2021-01-01 NOTE — Progress Notes (Signed)
Inpatient Diabetes Program Recommendations  AACE/ADA: New Consensus Statement on Inpatient Glycemic Control (2015)  Target Ranges:  Prepandial:   less than 140 mg/dL      Peak postprandial:   less than 180 mg/dL (1-2 hours)      Critically ill patients:  140 - 180 mg/dL   Lab Results  Component Value Date   GLUCAP 207 (H) 01/01/2021   HGBA1C 11.4 (H) 12/25/2020    Review of Glycemic Control  Latest Reference Range & Units 12/31/20 21:11 01/01/21 07:54  Glucose-Capillary 70 - 99 mg/dL 175 (H) 207 (H)  (H): Data is abnormally high Home DM Meds: Metformin 1000 mg BID   Current Orders: Novolog Moderate Correction Scale/ SSI (0-15 units) TID AC                           Semglee 20 units Daily  Spoke with patient regarding discharge recommendations.  Reviewed MATCH program, coverage, Relion products, encouraged picking up meter from [employer], survival skills, 70/30 with patient, interventions and when to follow up with Md. All questions answered. Pt to be seen at [facility].   In preparation for discharge: Metformin 1000 mg BID #[id number] Relion 70/30 18 units BID flex pen # [id number] Pen needles #[id number]  Thanks, Bronson Curb, MSN, RNC-OB Diabetes Coordinator [phone] (8a-5p)

## 2021-01-01 NOTE — TOC Transition Note (Signed)
Transition of Care [facility]) - CM/SW Discharge Note   Patient Details  Name: Eusebia Grulke MRN: [id number] Date of Birth: 08/05/1975  [facility] CM/SW Contact:  Dessa Phi, RN Phone Number: 01/01/2021, 12:03 PM   Clinical Narrative: No health insurance-MATCH letter given-patient has $3 co pay for each med that letter will provide-informed of no controlled substances,select pharmacies/1 x use for 12 month cal yr;appt already set-she says she will adhere to appt for pcp. MD aware of pharmacy to send scripts.No further CM needs.      Final next level of care: Home/Self Care Barriers to Discharge: No Barriers Identified   Patient Goals and CMS Choice Patient states their goals for this hospitalization and ongoing recovery are:: Get established in the area   Choice offered to / list presented to : NA  Discharge Placement                       Discharge Plan and Services In-house Referral: Clinical Social Work   Post Acute Care Choice: NA          DME Arranged: N/A DME Agency: NA                  Social Determinants of Health (SDOH) Interventions     Readmission Risk Interventions No flowsheet data found.

## 2021-01-01 NOTE — Discharge Summary (Addendum)
Physician Discharge Summary  Jamie Conley PNT:[id number] DOB: May 11, 1975 DOA: 12/24/2020  PCP: Pcp, No  Admit date: 12/24/2020 Discharge date: 01/01/2021  Admitted From: Home Disposition: Home  Recommendations for Outpatient Follow-up:  Follow up with PCP in 1-2 weeks Please obtain BMP/CBC in one week your next doctors visit.  Flomax daily ordered BuSpar changed to 5 mg 3 times daily Brief prescription for Robaxin, oxycodone along with bowel regimen prescribed Insulin regimen has been added due to uncontrolled diabetes Continue metformin Lexapro changed to 20 mg daily  ALL THE NEW PRESCRIPTIONS HAS BEEN SENT TO [facility] [address]. I HAVE CALLED [facility] TO CANCEL DUPLICATE PRESCRIPTIONS.    Discharge Condition: Stable CODE STATUS: Full code Diet recommendation: Diabetic  Brief/Interim Summary: 45 year old with history of anxiety, depression, cancer, DM2, HLD, HTN presenting with headaches, neck stiffness, photophobia.  Underwent lumbar puncture in the ER, empirically initially was started on IV acyclovir which was eventually discontinued.  Eventually rest of the antibacterials were discontinued as well once the CSF was negative.  Patient also received headache cocktail with minimal relief, neurology and cardiology were consulted.  Patient was seen by cardiology due to possible syncopal event.  MRI/MRV both were negative and started on DHE trial and then gabapentin.  Also developed dysuria type symptoms, UA was positive.  Initially she was started on Ancef which was eventually changed to oral Keflex.  Insulin and her depression/anxiety regimen was changed by psychiatry as mentioned above. Patient is medically stable for discharge today.     Assessment & Plan:   Principal Problem:   Headache Active Problems:   Depression   Hypertension   Migraine   Anxiety   Leukocytosis   Hyperlipidemia   Type 2 diabetes mellitus with hyperglycemia  (HCC)   Hypokalemia   Syncopal episodes   Hepatic steatosis   Aortic atherosclerosis (HCC)   Status migrainosus   Postconcussive syndrome     Intractable headache likely migraine and/status migrainosus, resolved -Underwent extensive work-up including lumbar puncture, MRI/MRV, CTA head and neck.  All of this has been negative.  Initially was on antiviral and anti-infectives which were all discontinued now.  Follow-up outpatient - Seen by neurology team. - Current medications-Benadryl, gabapentin   Acute syncopal episode in the setting of migraine, resolved -This is now resolved.  Echocardiogram during hospitalization was normal EF, grade 2 diastolic dysfunction.  Patient was seen by cardiology, no further work-up at this time   Essential hypertension - Lisinopril daily.    Leukocytosis, worsening -WBC improved.  No obvious evidence of infection.  She is already on antibiotics will be transition to oral Keflex.   Back pain, resolved -As needed pain medications   Hematuria in the setting of staff aureus and Streptococcus agalactiae UTI, present on admission CT shown nonobstructive renal stone which was discussed with urology by previous provider who recommends outpatient follow-up and treatment for urinary tract infection - Transition to oral Keflex upon discharge   Rectal bleeding and diarrhea - This is resolved -Hemoglobin remained stable    Uncontrolled Diabetes Mellitus Type 2, from hyperglycemia -hemoglobin A1c 11.4.  She will be discharged on home metformin, 70/30 18U BID, and supplies.     Anxiety and Depression -Seen by psychiatry.  Recommending BuSpar 5 mg 3 times daily,Lexapro 20 mg 3 times daily.  As needed Xanax   Hepatic steatosis -Follow-up outpatient   Hyperlipidemia -Lipitor 40 mg daily   Hypokalemia.   -Replete p.o.   Abdominal wound -Wound care nurse consulted  for further evaluation   Obesity -Complicates overall prognosis and care -Estimated body  mass index is 37.34 kg/m as calculated from the following:   Height as of this encounter: 5' 3" (1.6 m).   Weight as of this encounter: 95.6 kg. -Weight Loss and Dietary Counseling given    Body mass index is 37.02 kg/m.         Discharge Diagnoses:  Principal Problem:   Headache Active Problems:   Depression   Hypertension   Migraine   Anxiety   Leukocytosis   Hyperlipidemia   Type 2 diabetes mellitus with hyperglycemia (HCC)   Hypokalemia   Syncopal episodes   Hepatic steatosis   Aortic atherosclerosis (HCC)   Status migrainosus   Postconcussive syndrome      Consultations: Psych Neurology Cardiology  Subjective: Feeling better no complaints.   Discharge Exam: Vitals:   01/01/21 0448 01/01/21 0640  BP: 95/63 103/73  Pulse: 73   Resp: 19   Temp: 98.1 F (36.7 C)   SpO2: 100%    Vitals:   12/31/20 2113 01/01/21 0419 01/01/21 0448 01/01/21 0640  BP: (!) 124/91  95/63 103/73  Pulse: 71  73   Resp: 18  19   Temp: 98.9 F (37.2 C)  98.1 F (36.7 C)   TempSrc: Oral  Oral   SpO2: 100%  100%   Weight:  94.8 kg    Height:        General: Pt is alert, awake, not in acute distress Cardiovascular: RRR, S1/S2 +, no rubs, no gallops Respiratory: CTA bilaterally, no wheezing, no rhonchi Abdominal: Soft, NT, ND, bowel sounds + Extremities: no edema, no cyanosis  Discharge Instructions  Discharge Instructions     Ambulatory referral to Neurology   Complete by: As directed    An appointment is requested in approximately: 6 wks      Allergies as of 01/01/2021   No Known Allergies      Medication List     STOP taking these medications    butalbital-acetaminophen-caffeine 50-325-40 MG tablet Commonly known as: FIORICET   topiramate 100 MG tablet Commonly known as: TOPAMAX       TAKE these medications    ALPRAZolam 1 MG tablet Commonly known as: XANAX Take 1 tablet (1 mg total) by mouth daily as needed for anxiety.    atorvastatin 40 MG tablet Commonly known as: LIPITOR Take 1 tablet (40 mg total) by mouth daily.   blood glucose meter kit and supplies Kit Dispense based on patient and insurance preference. Use up to four times daily as directed.   busPIRone 5 MG tablet Commonly known as: BUSPAR Take 1 tablet (5 mg total) by mouth 3 (three) times daily.   cephALEXin 500 MG capsule Commonly known as: KEFLEX Take 1 capsule (500 mg total) by mouth 4 (four) times daily for 7 days.   escitalopram 20 MG tablet Commonly known as: LEXAPRO Take 1 tablet (20 mg total) by mouth daily. Start taking on: January 02, 2021 What changed:  medication strength how much to take   gabapentin 300 MG capsule Commonly known as: NEURONTIN Take 1 capsule (300 mg total) by mouth 3 (three) times daily.   HYDROmorphone 2 MG tablet Commonly known as: DILAUDID Take 1 tablet (2 mg total) by mouth every 6 (six) hours as needed for severe pain or moderate pain.   insulin starter kit- pen needles Misc 1 kit by Other route once for 1 dose.   lisinopril 20 MG tablet Commonly  known as: ZESTRIL Take 1 tablet (20 mg total) by mouth daily.   metFORMIN 1000 MG tablet Commonly known as: GLUCOPHAGE Take 1 tablet (1,000 mg total) by mouth 2 (two) times daily with a meal.   methocarbamol 500 MG tablet Commonly known as: ROBAXIN Take 1 tablet (500 mg total) by mouth every 6 (six) hours as needed for muscle spasms.   NovoLIN 70/30 Kwikpen (70-30) 100 UNIT/ML KwikPen Generic drug: insulin isophane & regular human KwikPen Inject 18 Units into the skin 2 (two) times daily.   Pen Needles 3/16" 31G X 5 MM Misc Use to inject insulin 4 (four) times daily -  before meals and at bedtime.   senna-docusate 8.6-50 MG tablet Commonly known as: Senokot-S Take 1 tablet by mouth at bedtime as needed for moderate constipation.   tamsulosin 0.4 MG Caps capsule Commonly known as: FLOMAX Take 1 capsule (0.4 mg total) by mouth once a day  after supper.   traZODone 50 MG tablet Commonly known as: DESYREL Take 1 tablet (50 mg total) by mouth at bedtime as needed for sleep. What changed: how much to take        [place] Follow up on 02/19/2021.   Why: Your appointment is scheduled for Friday February 21, 2021 at 8:50am. You will see Dr. Raul Del. You are on a waitlist and will be notified if an earlier appointment becomes available. Contact information: [place] [address] [place]. Call today.   Specialty: Behavioral Health Why: Medication management and Therapy.Walk in on Monday11/28/22 @ 7:30am. Contact information: [place] [address] [place]. Schedule an appointment as soon as possible for a visit in 1 week(s).   Contact information: [place] [phone]               No Known Allergies  You were cared for by a hospitalist during your hospital stay. If you have any questions about your discharge medications or the care you received while you were in the hospital after you are discharged, you can call the unit and asked to speak with the hospitalist on call if the hospitalist that took care of you is not available. Once you are discharged, your primary care physician will handle any further medical issues. Please note that no refills for any discharge medications will be authorized once you are discharged, as it is imperative that you return to your primary care physician (or establish a relationship with a primary care physician if you do not have one) for your aftercare needs so that they can reassess your need for medications and monitor your lab values.   Procedures/Studies: CT Angio Head W or Wo Contrast  Result Date: 12/24/2020 CLINICAL  DATA:  Migraine and lightheadedness.  Syncope. EXAM: CT ANGIOGRAPHY HEAD TECHNIQUE: Multidetector CT imaging of the head was performed using the standard protocol during bolus administration of intravenous contrast. Multiplanar CT image reconstructions and MIPs were obtained to evaluate the vascular anatomy. CONTRAST:  166m OMNIPAQUE IOHEXOL 350 MG/ML SOLN COMPARISON:  None FINDINGS: CT HEAD Brain: There is no mass, hemorrhage or extra-axial collection. The size and configuration of the ventricles and extra-axial CSF spaces are normal. The brain parenchyma is normal, without acute or chronic infarction. Vascular: No abnormal hyperdensity  of the major intracranial arteries or dural venous sinuses. No intracranial atherosclerosis. Skull: The visualized skull base, calvarium and extracranial soft tissues are normal. Sinuses/Orbits: No fluid levels or advanced mucosal thickening of the visualized paranasal sinuses. No mastoid or middle ear effusion. The orbits are normal. CTA HEAD POSTERIOR CIRCULATION: --Vertebral arteries: Normal --Inferior cerebellar arteries: Normal. --Basilar artery: Normal. --Superior cerebellar arteries: Normal. --Posterior cerebral arteries: Normal. ANTERIOR CIRCULATION: --Intracranial internal carotid arteries: Normal. --Anterior cerebral arteries (ACA): Normal. --Middle cerebral arteries (MCA): Normal. ANATOMIC VARIANTS: None Review of the MIP images confirms the above findings. IMPRESSION: Normal CT/CTA of the head. Electronically Signed   By: Ulyses Jarred M.D.   On: 12/24/2020 20:32   MR BRAIN W WO CONTRAST  Result Date: 12/25/2020 CLINICAL DATA:  Headache and dizziness EXAM: MRI HEAD WITHOUT AND WITH CONTRAST MR VENOGRAM HEAD WITHOUT AND WITH CONTRAST TECHNIQUE: Multiplanar, multi-echo pulse sequences of the brain and surrounding structures were acquired without and with intravenous contrast. Angiographic images of the intracranial venous structures were acquired using MRV technique  without and with intravenous contrast. CONTRAST:  51m GADAVIST GADOBUTROL 1 MMOL/ML IV SOLN COMPARISON:  No pertinent prior exam. FINDINGS: MRI HEAD WITHOUT AND WITH CONTRAST Brain: No acute infarct, mass effect or extra-axial collection. No acute or chronic hemorrhage. Normal white matter signal, parenchymal volume and CSF spaces. The midline structures are normal. Vascular: Major flow voids are preserved. Skull and upper cervical spine: Normal calvarium and skull base. Visualized upper cervical spine and soft tissues are normal. Sinuses/Orbits:No paranasal sinus fluid levels or advanced mucosal thickening. No mastoid or middle ear effusion. Normal orbits. MR VENOGRAM HEAD WITHOUT AND WITH CONTRAST There is no evidence of dural venous sinus or deep cerebral vein thrombosis. No dural venous sinus stenosis. IMPRESSION: Normal MRI and MRV of the brain. Electronically Signed   By: KUlyses JarredM.D.   On: 12/25/2020 23:16   MR Venogram Head  Result Date: 12/25/2020 CLINICAL DATA:  Headache and dizziness EXAM: MRI HEAD WITHOUT AND WITH CONTRAST MR VENOGRAM HEAD WITHOUT AND WITH CONTRAST TECHNIQUE: Multiplanar, multi-echo pulse sequences of the brain and surrounding structures were acquired without and with intravenous contrast. Angiographic images of the intracranial venous structures were acquired using MRV technique without and with intravenous contrast. CONTRAST:  118mGADAVIST GADOBUTROL 1 MMOL/ML IV SOLN COMPARISON:  No pertinent prior exam. FINDINGS: MRI HEAD WITHOUT AND WITH CONTRAST Brain: No acute infarct, mass effect or extra-axial collection. No acute or chronic hemorrhage. Normal white matter signal, parenchymal volume and CSF spaces. The midline structures are normal. Vascular: Major flow voids are preserved. Skull and upper cervical spine: Normal calvarium and skull base. Visualized upper cervical spine and soft tissues are normal. Sinuses/Orbits:No paranasal sinus fluid levels or advanced mucosal  thickening. No mastoid or middle ear effusion. Normal orbits. MR VENOGRAM HEAD WITHOUT AND WITH CONTRAST There is no evidence of dural venous sinus or deep cerebral vein thrombosis. No dural venous sinus stenosis. IMPRESSION: Normal MRI and MRV of the brain. Electronically Signed   By: KeUlyses Jarred.D.   On: 12/25/2020 23:16   CT Abdomen Pelvis W Contrast  Result Date: 12/23/2020 CLINICAL DATA:  Abdominal pain with nausea and vomiting. EXAM: CT ABDOMEN AND PELVIS WITH CONTRAST TECHNIQUE: Multidetector CT imaging of the abdomen and pelvis was performed using the standard protocol following bolus administration of intravenous contrast. CONTRAST:  10055mMNIPAQUE IOHEXOL 350 MG/ML SOLN COMPARISON:  November 05, 2020 FINDINGS: Lower chest: A stable 6 mm pleural based noncalcified lung  nodule is seen along the posterolateral aspect of the right lower lobe. Hepatobiliary: There is diffuse fatty infiltration of the liver parenchyma. No focal liver abnormality is seen. No gallstones, gallbladder wall thickening, or biliary dilatation. Pancreas: Unremarkable. No pancreatic ductal dilatation or surrounding inflammatory changes. Spleen: The spleen is surgically absent. Adrenals/Urinary Tract: Adrenal glands are unremarkable. Kidneys are normal, without obstructing renal calculi or focal lesions. A 7 mm nonobstructing renal calculus is seen within the left kidney. Mild, stable left-sided hydronephrosis is seen. Bladder is unremarkable. Stomach/Bowel: Stomach is within normal limits. Appendix appears normal. No evidence of bowel wall thickening, distention, or inflammatory changes. Vascular/Lymphatic: Mild aortic atherosclerosis. No enlarged abdominal or pelvic lymph nodes. Reproductive: Status post hysterectomy. No adnexal masses. Other: 4.1 cm x 2.7 cm and 1.8 cm x 1.7 cm fat containing ventral hernias are seen just above the level of the umbilicus. Musculoskeletal: No acute or significant osseous findings. IMPRESSION:  1. Hepatic steatosis. 2. Stable 6 mm pleural based right lower lobe lung nodule along the posterolateral aspect of the right lower lobe. Non-contrast chest CT at 6-12 months is recommended. If the nodule is stable at time of repeat CT, then future CT at 18-24 months (from today's scan) is considered optional for low-risk patients, but is recommended for high-risk patients. This recommendation follows the consensus statement: Guidelines for Management of Incidental Pulmonary Nodules Detected on CT Images: From the Fleischner Society 2017; Radiology 2017; 284:228-243. 3. Nonobstructing left renal calculus. 4. Absent spleen. 5. Aortic atherosclerosis. Aortic Atherosclerosis (ICD10-I70.0). Electronically Signed   By: Thaddeus  Houston M.D.   On: 12/23/2020 23:47   Portable Chest 1 View  Result Date: 12/25/2020 CLINICAL DATA:  Syncope. EXAM: PORTABLE CHEST 1 VIEW COMPARISON:  None. FINDINGS: The heart size and mediastinal contours are within normal limits. Both lungs are clear. The visualized skeletal structures are unremarkable. IMPRESSION: No active disease. Electronically Signed   By: William T Derry M.D.   On: 12/25/2020 10:58   ECHOCARDIOGRAM COMPLETE  Result Date: 12/25/2020    ECHOCARDIOGRAM REPORT   Patient Name:   Jamie Conley Date of Exam: 12/25/2020 Medical Rec #:  [id number]               Height:       63.0 in Accession #:    [id number]              Weight:       207.0 lb Date of Birth:  08/01/1975              BSA:          1.962 m Patient Age:    44 years                BP:           128/95 mmHg Patient Gender: F                       HR:           81 bpm. Exam Location:  Inpatient Procedure: 2D Echo, Cardiac Doppler and Color Doppler Indications:    Syncope  History:        Patient has no prior history of Echocardiogram examinations.                 Risk Factors:Hypertension and Diabetes.  Sonographer:    Taylor Peper Referring Phys: [id number] DAVID MANUEL ORTIZ IMPRESSIONS  1. Left  ventricular ejection fraction, by estimation, is 55   to 60%. Left ventricular ejection fraction by PLAX is 62 %. The left ventricle has normal function. The left ventricle has no regional wall motion abnormalities. There is mild concentric left ventricular hypertrophy. Left ventricular diastolic parameters are consistent with Grade II diastolic dysfunction (pseudonormalization).  2. Right ventricular systolic function is normal. The right ventricular size is normal. There is normal pulmonary artery systolic pressure.  3. The mitral valve is normal in structure. Trivial mitral valve regurgitation. No evidence of mitral stenosis.  4. The aortic valve is tricuspid. Aortic valve regurgitation is not visualized. No aortic stenosis is present.  5. The inferior vena cava is normal in size with <50% respiratory variability, suggesting right atrial pressure of 8 mmHg. FINDINGS  Left Ventricle: Left ventricular ejection fraction, by estimation, is 55 to 60%. Left ventricular ejection fraction by PLAX is 62 %. The left ventricle has normal function. The left ventricle has no regional wall motion abnormalities. The left ventricular internal cavity size was normal in size. There is mild concentric left ventricular hypertrophy. Left ventricular diastolic parameters are consistent with Grade II diastolic dysfunction (pseudonormalization). Normal left ventricular filling pressure. Right Ventricle: The right ventricular size is normal. No increase in right ventricular wall thickness. Right ventricular systolic function is normal. There is normal pulmonary artery systolic pressure. The tricuspid regurgitant velocity is 1.67 m/s, and  with an assumed right atrial pressure of 8 mmHg, the estimated right ventricular systolic pressure is 19.2 mmHg. Left Atrium: Left atrial size was normal in size. Right Atrium: Right atrial size was normal in size. Pericardium: There is no evidence of pericardial effusion. Mitral Valve: The mitral valve  is normal in structure. Trivial mitral valve regurgitation. No evidence of mitral valve stenosis. Tricuspid Valve: The tricuspid valve is normal in structure. Tricuspid valve regurgitation is trivial. No evidence of tricuspid stenosis. Aortic Valve: The aortic valve is tricuspid. Aortic valve regurgitation is not visualized. No aortic stenosis is present. Aortic valve peak gradient measures 10.9 mmHg. Pulmonic Valve: The pulmonic valve was normal in structure. Pulmonic valve regurgitation is trivial. No evidence of pulmonic stenosis. Aorta: The aortic root is normal in size and structure. Venous: The inferior vena cava is normal in size with less than 50% respiratory variability, suggesting right atrial pressure of 8 mmHg. IAS/Shunts: No atrial level shunt detected by color flow Doppler.  LEFT VENTRICLE PLAX 2D LV EF:         Left            Diastology                ventricular     LV e' medial:    7.34 cm/s                ejection        LV E/e' medial:  9.3                fraction by     LV e' lateral:   7.72 cm/s                PLAX is 62      LV E/e' lateral: 8.9                %. LVIDd:         4.20 cm LVIDs:         2.80 cm LV PW:         1.28 cm LV IVS:        1.14   cm LVOT diam:     2.00 cm LV SV:         77 LV SV Index:   39 LVOT Area:     3.14 cm  LV Volumes (MOD) LV vol d, MOD    83.2 ml A2C: LV vol d, MOD    94.8 ml A4C: LV vol s, MOD    35.1 ml A2C: LV vol s, MOD    39.2 ml A4C: LV SV MOD A2C:   48.1 ml LV SV MOD A4C:   94.8 ml LV SV MOD BP:    54.2 ml RIGHT VENTRICLE             IVC RV Basal diam:  2.80 cm     IVC diam: 1.70 cm RV Mid diam:    2.30 cm RV S prime:     11.90 cm/s TAPSE (M-mode): 2.1 cm LEFT ATRIUM             Index        RIGHT ATRIUM           Index LA diam:        3.60 cm 1.83 cm/m   RA Area:     12.60 cm LA Vol (A2C):   33.9 ml 17.28 ml/m  RA Volume:   26.70 ml  13.61 ml/m LA Vol (A4C):   37.8 ml 19.26 ml/m LA Biplane Vol: 37.6 ml 19.16 ml/m  AORTIC VALVE AV Area (Vmax): 2.32  cm AV Vmax:        165.00 cm/s AV Peak Grad:   10.9 mmHg LVOT Vmax:      122.00 cm/s LVOT Vmean:     89.900 cm/s LVOT VTI:       0.245 m  AORTA Ao Root diam: 2.90 cm Ao Asc diam:  3.20 cm MITRAL VALVE               TRICUSPID VALVE MV Area (PHT): 3.91 cm    TR Peak grad:   11.2 mmHg MV Decel Time: 194 msec    TR Vmax:        167.00 cm/s MV E velocity: 68.60 cm/s MV A velocity: 55.70 cm/s  SHUNTS MV E/A ratio:  1.23        Systemic VTI:  0.24 m                            Systemic Diam: 2.00 cm Skeet Latch MD Electronically signed by Skeet Latch MD Signature Date/Time: 12/25/2020/6:41:32 PM    Final    CT RENAL STONE STUDY  Result Date: 12/28/2020 CLINICAL DATA:  Flank pain.  Concern for kidney stone. EXAM: CT ABDOMEN AND PELVIS WITHOUT CONTRAST TECHNIQUE: Multidetector CT imaging of the abdomen and pelvis was performed following the standard protocol without IV contrast. COMPARISON:  CT abdomen pelvis dated 12/23/2020. FINDINGS: Evaluation of this exam is limited in the absence of intravenous contrast. Lower chest: Faint 6 mm subpleural nodule at the right lung base similar to prior CT. Follow-up as per recommendation of the prior CT. The visualized lung bases are otherwise clear. There is coronary vascular calcification. No intra-abdominal free air or free fluid. Hepatobiliary: Fatty liver. No intrahepatic biliary dilatation. The gallbladder is unremarkable. Pancreas: Unremarkable. No pancreatic ductal dilatation or surrounding inflammatory changes. Spleen: Splenectomy. Probable small splenule or residual splenic tissue in the left upper abdomen. Adrenals/Urinary Tract: The adrenal glands are unremarkable. Left renal inferior pole calculus measures approximately  11 mm similar to prior CT. There is minimal fullness of the left renal collecting system without frank hydronephrosis. No obstructing stone identified. There is no hydronephrosis or nephrolithiasis on the right. The visualized ureters and  urinary bladder appear unremarkable. Stomach/Bowel: There is moderate stool throughout the colon. No bowel obstruction or active inflammation. The appendix is normal. Vascular/Lymphatic: Mild aortoiliac atherosclerotic disease. The IVC is unremarkable. No portal venous gas. There is no adenopathy. Reproductive: Hysterectomy.  No adnexal masses. Other: Small fat containing umbilical hernia. Musculoskeletal: No acute osseous pathology. IMPRESSION: 1. No acute intra-abdominal or pelvic pathology. 2. An 11 mm left renal inferior pole calculus similar to prior CT. No hydronephrosis or obstructing stone. 3. Fatty liver. 4. Aortic Atherosclerosis (ICD10-I70.0). Electronically Signed   By: Arash  Radparvar M.D.   On: 12/28/2020 21:49     The results of significant diagnostics from this hospitalization (including imaging, microbiology, ancillary and laboratory) are listed below for reference.     Microbiology: Recent Results (from the past 240 hour(s))  Urine Culture     Status: Abnormal   Collection Time: 12/23/20  9:07 PM   Specimen: Urine, Clean Catch  Result Value Ref Range Status   Specimen Description   Final    URINE, CLEAN CATCH Performed at [facility], [address], [place], [place] [address]    Special Requests   Final    NONE Performed at [facility], [address], [place], [place] [address]    Culture (A)  Final    40,000 COLONIES/mL STAPHYLOCOCCUS AUREUS 40,000 COLONIES/mL STREPTOCOCCUS AGALACTIAE TESTING AGAINST S. AGALACTIAE NOT ROUTINELY PERFORMED DUE TO PREDICTABILITY OF AMP/PEN/VAN SUSCEPTIBILITY. Performed at [facility], [address] [place], [place] [address]    Report Status 12/26/2020 FINAL  Final   Organism ID, Bacteria STAPHYLOCOCCUS AUREUS (A)  Final      Susceptibility   Staphylococcus aureus - MIC*    CIPROFLOXACIN <=0.5 SENSITIVE Sensitive     GENTAMICIN <=0.5 SENSITIVE Sensitive     NITROFURANTOIN <=16  SENSITIVE Sensitive     OXACILLIN 0.5 SENSITIVE Sensitive     TETRACYCLINE <=1 SENSITIVE Sensitive     VANCOMYCIN <=0.5 SENSITIVE Sensitive     TRIMETH/SULFA <=10 SENSITIVE Sensitive     CLINDAMYCIN <=0.25 SENSITIVE Sensitive     RIFAMPIN <=0.5 SENSITIVE Sensitive     Inducible Clindamycin NEGATIVE Sensitive     * 40,000 COLONIES/mL STAPHYLOCOCCUS AUREUS  Wet prep, genital     Status: None   Collection Time: 12/23/20  9:10 PM  Result Value Ref Range Status   Yeast Wet Prep HPF POC NONE SEEN NONE SEEN Final   Trich, Wet Prep NONE SEEN NONE SEEN Final   Clue Cells Wet Prep HPF POC NONE SEEN NONE SEEN Final   WBC, Wet Prep HPF POC <10 <10 Final    Comment: Please note change in reference range.   Sperm NONE SEEN  Final    Comment: Performed at [facility], [address], [place], [place] [address]  Resp Panel by RT-PCR (Flu A&B, Covid) Nasopharyngeal Swab     Status: None   Collection Time: 12/24/20  7:08 PM   Specimen: Nasopharyngeal Swab; Nasopharyngeal(NP) swabs in vial transport medium  Result Value Ref Range Status   SARS Coronavirus 2 by RT PCR NEGATIVE NEGATIVE Final    Comment: (NOTE) SARS-CoV-2 target nucleic acids are NOT DETECTED.  The SARS-CoV-2 RNA is generally detectable in upper respiratory specimens during the acute phase of infection.   The lowest concentration of SARS-CoV-2 viral copies this assay can detect is 138 copies/mL. A negative result does not preclude SARS-Cov-2 infection and should not be used as the sole basis for treatment or other patient management decisions. A negative result may occur with  improper specimen collection/handling, submission of specimen other than nasopharyngeal swab, presence of viral mutation(s) within the areas targeted by this assay, and inadequate number of viral copies(<138 copies/mL). A negative result must be combined with clinical observations, patient history, and epidemiological information. The  expected result is Negative.  Fact Sheet for Patients:  EntrepreneurPulse.com.au  Fact Sheet for Healthcare Providers:  IncredibleEmployment.be  This test is no t yet approved or cleared by the [place] FDA and  has been authorized for detection and/or diagnosis of SARS-CoV-2 by FDA under an Emergency Use Authorization (EUA). This EUA will remain  in effect (meaning this test can be used) for the duration of the COVID-19 declaration under Section 564(b)(1) of the Act, 21 U.S.C.section 360bbb-3(b)(1), unless the authorization is terminated  or revoked sooner.       Influenza A by PCR NEGATIVE NEGATIVE Final   Influenza B by PCR NEGATIVE NEGATIVE Final    Comment: (NOTE) The Xpert Xpress SARS-CoV-2/FLU/RSV plus assay is intended as an aid in the diagnosis of influenza from Nasopharyngeal swab specimens and should not be used as a sole basis for treatment. Nasal washings and aspirates are unacceptable for Xpert Xpress SARS-CoV-2/FLU/RSV testing.  Fact Sheet for Patients: EntrepreneurPulse.com.au  Fact Sheet for Healthcare Providers: IncredibleEmployment.be  This test is not yet approved or cleared by the [place] FDA and has been authorized for detection and/or diagnosis of SARS-CoV-2 by FDA under an Emergency Use Authorization (EUA). This EUA will remain in effect (meaning this test can be used) for the duration of the COVID-19 declaration under Section 564(b)(1) of the Act, 21 U.S.C. section 360bbb-3(b)(1), unless the authorization is terminated or revoked.  Performed at [facility], [address] [place], [place] [address]   Culture, blood (Routine X 2) w Reflex to ID Panel     Status: None   Collection Time: 12/24/20  9:40 PM   Specimen: BLOOD  Result Value Ref Range Status   Specimen Description   Final    BLOOD Blood Culture results may not be optimal due to an  excessive volume of blood received in culture bottles Performed at [facility], [address] [place], [place] [address]    Special Requests   Final    BLOOD RIGHT HAND Performed at [facility], [address], [place], [place] [address]    Culture   Final    NO GROWTH 5 DAYS Performed at [facility], [place] [address] [place], [place] [address]    Report Status 12/30/2020 FINAL  Final  Culture, blood (Routine X 2) w Reflex to ID Panel     Status: None   Collection Time: 12/24/20  9:50 PM   Specimen: BLOOD  Result Value Ref Range Status   Specimen Description   Final    BLOOD Blood Culture results may not be optimal due to an excessive volume of blood received in culture bottles Performed at [facility], [address] [place], [place] [address]    Special Requests   Final    BLOOD LEFT HAND Performed at [facility], [address] [place], [place] [address]    Culture   Final    NO GROWTH 5 DAYS Performed  at [facility], [address] [place], [place] [address]    Report Status 12/30/2020 FINAL  Final  CSF culture w Gram Stain     Status: None   Collection Time: 12/24/20 10:52 PM   Specimen: Lumbar Puncture; Cerebrospinal Fluid  Result Value Ref Range Status   Specimen Description   Final    LUMBAR Performed at [facility], [address], [place], [place] [address]    Special Requests   Final    NONE Performed at [facility], [address], [place], [place] [address]    Gram Stain NO ORGANISMS SEEN CYTOSPIN SMEAR   Final   Culture   Final    NO GROWTH Performed at [facility], [address] [place], [place] [address]    Report Status 12/28/2020 FINAL  Final  Anaerobic culture w Gram Stain     Status: None   Collection Time: 12/24/20 10:52 PM   Specimen: Lumbar Puncture; Cerebrospinal Fluid  Result Value Ref  Range Status   Specimen Description   Final    LUMBAR Performed at [facility], [address], [place], [place] [address]    Special Requests   Final    NONE Performed at [facility], [address], [place], [place] [address]    Gram Stain NO ORGANISMS SEEN CYTOSPIN SMEAR   Final   Culture   Final    NO ANAEROBES ISOLATED Performed at [facility], [address] [place], [place] [address]    Report Status 12/29/2020 FINAL  Final  Culture, blood (routine x 2)     Status: None   Collection Time: 12/25/20  3:58 AM   Specimen: BLOOD LEFT HAND  Result Value Ref Range Status   Specimen Description   Final    BLOOD LEFT HAND Performed at [facility], [address] [place], [place] [address]    Special Requests   Final    BOTTLES DRAWN AEROBIC ONLY Blood Culture results may not be optimal due to an inadequate volume of blood received in culture bottles Performed at [facility], [address] [place], [place] [address]    Culture   Final    NO GROWTH 5 DAYS Performed at [facility], [address] [place], [place] [address]    Report Status 12/30/2020 FINAL  Final  Culture, blood (routine x 2)     Status: None   Collection Time: 12/25/20  3:58 AM   Specimen: BLOOD LEFT ARM  Result Value Ref Range Status   Specimen Description   Final    BLOOD LEFT ARM Performed at [facility], [address] [place], [place] [address]    Special Requests   Final    BOTTLES DRAWN AEROBIC ONLY Blood Culture results may not be optimal due to an inadequate volume of blood received in culture bottles Performed at [facility], [address] [place], [place] [address]    Culture   Final    NO GROWTH 5 DAYS Performed at  [facility], [address] [place],  [address]    Report Status 12/30/2020 FINAL  Final  Urine Culture     Status:  Abnormal   Collection Time: 12/29/20  8:16 AM   Specimen: Urine, Clean Catch  Result Value Ref Range Status   Specimen Description   Final    URINE, CLEAN CATCH Performed at [facility], [place] [address] [place], [place] [address]    Special Requests   Final    NONE Performed at [facility], [place] [address] [place], [place] [address]    Culture (A)  Final    <10,000 COLONIES/mL INSIGNIFICANT GROWTH Performed at [facility] [address], [place], [place] [address]    Report Status 12/30/2020 FINAL  Final     Labs: BNP (last 3 results) No results for input(s): BNP in the last 8760 hours. Basic Metabolic Panel: Recent Labs  Lab 12/27/20 0506 12/28/20 0436 12/29/20 0425 12/30/20 0426 12/31/20 0503 01/01/21 0435  NA 140 140 139 137 139 137  K 3.8 3.1* 3.8 3.5 3.6 3.4*  CL 114* 111 111 105 107 105  CO2 18* _0 GLUCOSE 180* 241* 209* 331* 238* 261*  BUN _1 26*  CREATININE 0.50 0.60 0.57 0.62 0.62 0.66  CALCIUM 9.2 8.9 9.0 9.2 10.2 9.0  MG 2.0 1.8 1.9 1.7 1.5* 1.6*  PHOS 3.7 3.5 5.0* 4.8*  --   --    Liver Function Tests: Recent Labs  Lab 12/27/20 0506 12/28/20 0436 12/29/20 0425 12/30/20 0426  AST 12* 12* 11* 16  ALT _2 ALKPHOS 84 75 76 80  BILITOT 0.6 0.5 0.8 0.4  PROT 6.9 6.4* 6.7 6.9  ALBUMIN 3.4* 3.3* 3.4* 3.5   No results for input(s): LIPASE, AMYLASE in the last 168 hours. No results for input(s): AMMONIA in the last 168 hours. CBC: Recent Labs  Lab 12/27/20 0506 12/28/20 0436 12/29/20 0425 12/30/20 0426 12/31/20 0503 01/01/21 0435  WBC 15.3* 17.7* 14.0* 14.3* 20.4* 18.6*  NEUTROABS 7.5 9.2* 4.7 4.5  --   --   HGB 14.3 13.0 12.7 13.2 13.9 13.3  HCT 43.2 38.7 37.9 41.2 41.1 40.0  MCV 93.5 92.1 93.1 99.0 93.4 94.8  PLT 517* 470* 455* 439* 331 484*   Cardiac Enzymes: No results for input(s): CKTOTAL, CKMB, CKMBINDEX, TROPONINI in the last 168 hours. BNP: Invalid  input(s): POCBNP CBG: Recent Labs  Lab 12/31/20 1127 12/31/20 1551 12/31/20 2111 01/01/21 0754 01/01/21 1158  GLUCAP 171* 175* 175* 207* 156*   D-Dimer No results for input(s): DDIMER in the last 72 hours. Hgb A1c No results for input(s): HGBA1C in the last 72 hours. Lipid Profile No results for input(s): CHOL, HDL, LDLCALC, TRIG, CHOLHDL, LDLDIRECT in the last 72 hours. Thyroid function studies No results for input(s): TSH, T4TOTAL, T3FREE, THYROIDAB in the last 72 hours.  Invalid input(s): FREET3 Anemia work up No results for input(s): VITAMINB12, FOLATE, FERRITIN, TIBC, IRON, RETICCTPCT in the last 72 hours. Urinalysis    Component Value Date/Time   COLORURINE YELLOW 12/28/2020 1142   APPEARANCEUR HAZY (A) 12/28/2020 1142   LABSPEC 1.010 12/28/2020 1142   PHURINE 8.0 12/28/2020 1142   GLUCOSEU 50 (A) 12/28/2020 1142   HGBUR LARGE (A) 12/28/2020 1142   BILIRUBINUR NEGATIVE 12/28/2020 1142   KETONESUR NEGATIVE 12/28/2020 1142   PROTEINUR NEGATIVE 12/28/2020 1142   NITRITE NEGATIVE 12/28/2020 1142   LEUKOCYTESUR NEGATIVE 12/28/2020 1142   Sepsis Labs Invalid input(s): PROCALCITONIN,  WBC,  LACTICIDVEN Microbiology Recent Results (from the past 240 hour(s))  Urine Culture     Status: Abnormal   Collection Time: 12/23/20  9:07 PM   Specimen: Urine, Clean Catch  Result Value Ref Range Status   Specimen Description   Final    URINE, CLEAN CATCH Performed at  [facility], [address], [place], [place] [address]    Special Requests   Final    NONE Performed at [facility], [address], [place], [place] [address]    Culture (A)  Final    40,000 COLONIES/mL STAPHYLOCOCCUS AUREUS 40,000 COLONIES/mL STREPTOCOCCUS AGALACTIAE TESTING AGAINST S. AGALACTIAE NOT ROUTINELY PERFORMED DUE TO PREDICTABILITY OF AMP/PEN/VAN SUSCEPTIBILITY. Performed at [facility], [address] [place], [place] [address]    Report Status  12/26/2020 FINAL  Final   Organism ID, Bacteria STAPHYLOCOCCUS AUREUS (A)  Final      Susceptibility   Staphylococcus aureus - MIC*    CIPROFLOXACIN <=0.5 SENSITIVE Sensitive     GENTAMICIN <=0.5 SENSITIVE Sensitive     NITROFURANTOIN <=16 SENSITIVE Sensitive     OXACILLIN 0.5 SENSITIVE Sensitive     TETRACYCLINE <=1 SENSITIVE Sensitive     VANCOMYCIN <=0.5 SENSITIVE Sensitive     TRIMETH/SULFA <=10 SENSITIVE Sensitive     CLINDAMYCIN <=0.25 SENSITIVE Sensitive     RIFAMPIN <=0.5 SENSITIVE Sensitive     Inducible Clindamycin NEGATIVE Sensitive     * 40,000 COLONIES/mL STAPHYLOCOCCUS AUREUS  Wet prep, genital     Status: None   Collection Time: 12/23/20  9:10 PM  Result Value Ref Range Status   Yeast Wet Prep HPF POC NONE SEEN NONE SEEN Final   Trich, Wet Prep NONE SEEN NONE SEEN Final   Clue Cells Wet Prep HPF POC NONE SEEN NONE SEEN Final   WBC, Wet Prep HPF POC <10 <10 Final    Comment: Please note change in reference range.   Sperm NONE SEEN  Final    Comment: Performed at [facility], [address], [place], [place] [address]  Resp Panel by RT-PCR (Flu A&B, Covid) Nasopharyngeal Swab     Status: None   Collection Time: 12/24/20  7:08 PM   Specimen: Nasopharyngeal Swab; Nasopharyngeal(NP) swabs in vial transport medium  Result Value Ref Range Status   SARS Coronavirus 2 by RT PCR NEGATIVE NEGATIVE Final    Comment: (NOTE) SARS-CoV-2 target nucleic acids are NOT DETECTED.  The SARS-CoV-2 RNA is generally detectable in upper respiratory specimens during the acute phase of infection. The lowest concentration of SARS-CoV-2 viral copies this assay can detect is 138 copies/mL. A negative result does not preclude SARS-Cov-2 infection and should not be used as the sole basis for treatment or other patient management decisions. A negative result may occur with  improper specimen collection/handling, submission of specimen other than nasopharyngeal swab,  presence of viral mutation(s) within the areas targeted by this assay, and inadequate number of viral copies(<138 copies/mL). A negative result must be combined with clinical observations, patient history, and epidemiological information. The expected result is Negative.  Fact Sheet for Patients:  https://www.fda.gov/media/152166/download  Fact Sheet for Healthcare Providers:  https://www.fda.gov/media/152162/download  This test is no t yet approved or cleared by the [place] FDA and  has been authorized for detection and/or diagnosis of SARS-CoV-2 by FDA under an Emergency Use Authorization (EUA). This EUA will remain  in effect (meaning this test can be used) for the duration of the COVID-19 declaration under Section 564(b)(1) of the Act, 21 U.S.C.section 360bbb-3(b)(1), unless the authorization is terminated  or revoked sooner.       Influenza A by PCR NEGATIVE NEGATIVE Final   Influenza B by PCR NEGATIVE NEGATIVE Final    Comment: (NOTE) The Xpert Xpress SARS-CoV-2/FLU/RSV plus assay is intended as an aid in   the diagnosis of influenza from Nasopharyngeal swab specimens and should not be used as a sole basis for treatment. Nasal washings and aspirates are unacceptable for Xpert Xpress SARS-CoV-2/FLU/RSV testing.  Fact Sheet for Patients: https://www.fda.gov/media/152166/download  Fact Sheet for Healthcare Providers: https://www.fda.gov/media/152162/download  This test is not yet approved or cleared by the [place] FDA and has been authorized for detection and/or diagnosis of SARS-CoV-2 by FDA under an Emergency Use Authorization (EUA). This EUA will remain in effect (meaning this test can be used) for the duration of the COVID-19 declaration under Section 564(b)(1) of the Act, 21 U.S.C. section 360bbb-3(b)(1), unless the authorization is terminated or revoked.  Performed at [facility], [address], [place], [place] [address]    Culture, blood (Routine X 2) w Reflex to ID Panel     Status: None   Collection Time: 12/24/20  9:40 PM   Specimen: BLOOD  Result Value Ref Range Status   Specimen Description   Final    BLOOD Blood Culture results may not be optimal due to an excessive volume of blood received in culture bottles Performed at [facility], [address], [place], [place] [address]    Special Requests   Final    BLOOD RIGHT HAND Performed at [facility], [address], [place], [place] [address]    Culture   Final    NO GROWTH 5 DAYS Performed at [facility], [address] [place], [place] [address]    Report Status 12/30/2020 FINAL  Final  Culture, blood (Routine X 2) w Reflex to ID Panel     Status: None   Collection Time: 12/24/20  9:50 PM   Specimen: BLOOD  Result Value Ref Range Status   Specimen Description   Final    BLOOD Blood Culture results may not be optimal due to an excessive volume of blood received in culture bottles Performed at [facility], [address], [place], [place] [address]    Special Requests   Final    BLOOD LEFT HAND Performed at [facility], [address], [place], [place] [address]    Culture   Final    NO GROWTH 5 DAYS Performed at [facility], [address] [place], [place] [address]    Report Status 12/30/2020 FINAL  Final  CSF culture w Gram Stain     Status: None   Collection Time: 12/24/20 10:52 PM   Specimen: Lumbar Puncture; Cerebrospinal Fluid  Result Value Ref Range Status   Specimen Description   Final    LUMBAR Performed at [facility], [address], [place], [place] [address]    Special Requests   Final    NONE Performed at [facility], [address], [place], [place] [address]    Gram Stain NO ORGANISMS SEEN CYTOSPIN SMEAR   Final   Culture   Final    NO GROWTH Performed at [facility], [address] [place], [place] [address]    Report Status 12/28/2020 FINAL  Final  Anaerobic culture w Gram Stain     Status: None   Collection Time: 12/24/20 10:52 PM   Specimen: Lumbar Puncture; Cerebrospinal Fluid  Result Value Ref Range Status   Specimen Description   Final    LUMBAR Performed at [facility], [address], [place], [place] [address]    Special Requests   Final    NONE Performed at [facility], [address], [place], [place] [address]    Gram Stain NO ORGANISMS SEEN CYTOSPIN SMEAR   Final   Culture   Final    NO ANAEROBES ISOLATED Performed at [facility], [address] [place], [place] [address]    Report Status 12/29/2020 FINAL  Final  Culture, blood (routine x 2)     Status: None   Collection Time: 12/25/20  3:58 AM   Specimen: BLOOD LEFT HAND  Result Value Ref Range Status   Specimen Description   Final    BLOOD LEFT HAND Performed at [facility], [address] [place], [place] [address]    Special Requests   Final    BOTTLES DRAWN AEROBIC ONLY Blood Culture results may not be optimal due to an inadequate volume of blood received in culture bottles Performed at [facility], [address] [place], [place] [address]    Culture   Final    NO GROWTH 5 DAYS Performed at [facility], [address] [place], [place] [address]    Report Status 12/30/2020 FINAL  Final  Culture, blood (routine x 2)     Status: None   Collection Time: 12/25/20  3:58 AM   Specimen: BLOOD LEFT ARM  Result Value Ref Range Status   Specimen Description   Final    BLOOD LEFT ARM Performed at [facility], [address] [place], [place] [address]    Special Requests   Final    BOTTLES DRAWN AEROBIC ONLY Blood Culture results may not be optimal due to an inadequate volume of blood received in culture bottles Performed at [facility], [address] [place], [place] [address]    Culture   Final    NO GROWTH 5 DAYS Performed at [facility], [address] [place], [place] [address]    Report Status 12/30/2020 FINAL  Final  Urine Culture     Status: Abnormal   Collection Time: 12/29/20  8:16 AM   Specimen: Urine, Clean Catch  Result Value Ref Range Status   Specimen Description   Final    URINE, CLEAN CATCH Performed at [facility], [address] [place], [place] [address]    Special Requests   Final    NONE Performed at [facility], [address] [place], [place] [address]    Culture (A)  Final    <10,000 COLONIES/mL INSIGNIFICANT GROWTH Performed at [facility], [address] [place], [place] [address]    Report Status 12/30/2020 FINAL  Final     Time coordinating discharge:  I have spent 35 minutes face to face with the patient and on the ward discussing the patients care, assessment, plan and disposition with other care givers. >50% of the time was devoted counseling the patient about the risks and benefits of treatment/Discharge disposition and coordinating care.   SIGNED:   Ankit Chirag Amin, MD  [facility] 01/01/2021, 12:53 PM   If 7PM-7AM, please contact night-coverage   

## 2021-01-02 LAB — GASTROINTESTINAL PANEL BY PCR, STOOL (REPLACES STOOL CULTURE)

## 2021-01-03 ENCOUNTER — Emergency Department (HOSPITAL_COMMUNITY)
Admission: EM | Admit: 2021-01-03 | Discharge: 2021-01-04 | Disposition: A | Discharge status: Home / Self Care | Payer: Self-pay | Attending: Emergency Medicine | Admitting: Emergency Medicine

## 2021-01-03 ENCOUNTER — Encounter (HOSPITAL_COMMUNITY): Payer: Self-pay | Admitting: Emergency Medicine

## 2021-01-03 ENCOUNTER — Other Ambulatory Visit: Payer: Self-pay

## 2021-01-03 ENCOUNTER — Emergency Department (HOSPITAL_COMMUNITY): Payer: Self-pay

## 2021-01-03 DIAGNOSIS — E1169 Type 2 diabetes mellitus with other specified complication: Secondary | ICD-10-CM | POA: Insufficient documentation

## 2021-01-03 DIAGNOSIS — Z79899 Other long term (current) drug therapy: Secondary | ICD-10-CM | POA: Insufficient documentation

## 2021-01-03 DIAGNOSIS — N200 Calculus of kidney: Secondary | ICD-10-CM | POA: Insufficient documentation

## 2021-01-03 DIAGNOSIS — Z7984 Long term (current) use of oral hypoglycemic drugs: Secondary | ICD-10-CM | POA: Insufficient documentation

## 2021-01-03 DIAGNOSIS — Z794 Long term (current) use of insulin: Secondary | ICD-10-CM | POA: Insufficient documentation

## 2021-01-03 DIAGNOSIS — R109 Unspecified abdominal pain: Secondary | ICD-10-CM

## 2021-01-03 DIAGNOSIS — I10 Essential (primary) hypertension: Secondary | ICD-10-CM | POA: Insufficient documentation

## 2021-01-03 DIAGNOSIS — E785 Hyperlipidemia, unspecified: Secondary | ICD-10-CM | POA: Insufficient documentation

## 2021-01-03 LAB — COMPREHENSIVE METABOLIC PANEL
ALT: 11 U/L (ref 0–44)
AST: 14 U/L — ABNORMAL LOW (ref 15–41)
Albumin: 3.9 g/dL (ref 3.5–5.0)
Alkaline Phosphatase: 83 U/L (ref 38–126)
Anion gap: 10 (ref 5–15)
BUN: 33 mg/dL — ABNORMAL HIGH (ref 6–20)
CO2: 22 mmol/L (ref 22–32)
Calcium: 9.4 mg/dL (ref 8.9–10.3)
Chloride: 104 mmol/L (ref 98–111)
Creatinine, Ser: 0.89 mg/dL (ref 0.44–1.00)
GFR, Estimated: >60 mL/min (ref >60)
Glucose, Bld: 199 mg/dL — ABNORMAL HIGH (ref 70–99)
Potassium: 3.8 mmol/L (ref 3.5–5.1)
Sodium: 136 mmol/L (ref 135–145)
Total Bilirubin: 0.5 mg/dL (ref 0.3–1.2)
Total Protein: 7.4 g/dL (ref 6.5–8.1)

## 2021-01-03 LAB — URINALYSIS, ROUTINE W REFLEX MICROSCOPIC
Bilirubin Urine: NEGATIVE
Glucose, UA: NEGATIVE mg/dL
Ketones, ur: NEGATIVE mg/dL
Nitrite: NEGATIVE
Protein, ur: 30 mg/dL — AB
RBC / HPF: >50 RBC/hpf — ABNORMAL HIGH (ref 0–5)
Specific Gravity, Urine: 1.016 (ref 1.005–1.030)
WBC, UA: >50 WBC/hpf — ABNORMAL HIGH (ref 0–5)
pH: 5 (ref 5.0–8.0)

## 2021-01-03 LAB — CBC WITH DIFFERENTIAL/PLATELET
Abs Immature Granulocytes: 0.06 10*3/uL (ref 0.00–0.07)
Basophils Absolute: 0.1 10*3/uL (ref 0.0–0.1)
Basophils Relative: 1 %
Eosinophils Absolute: 0.4 10*3/uL (ref 0.0–0.5)
Eosinophils Relative: 2 %
HCT: 40.3 % (ref 36.0–46.0)
Hemoglobin: 13.4 g/dL (ref 12.0–15.0)
Immature Granulocytes: 0 %
Lymphocytes Relative: 43 %
Lymphs Abs: 8 10*3/uL — ABNORMAL HIGH (ref 0.7–4.0)
MCH: 31.7 pg (ref 26.0–34.0)
MCHC: 33.3 g/dL (ref 30.0–36.0)
MCV: 95.3 fL (ref 80.0–100.0)
Monocytes Absolute: 0.9 10*3/uL (ref 0.1–1.0)
Monocytes Relative: 5 %
Neutro Abs: 9.1 10*3/uL — ABNORMAL HIGH (ref 1.7–7.7)
Neutrophils Relative %: 49 %
Platelets: 466 10*3/uL — ABNORMAL HIGH (ref 150–400)
RBC: 4.23 MIL/uL (ref 3.87–5.11)
RDW: 14.4 % (ref 11.5–15.5)
WBC: 18.6 10*3/uL — ABNORMAL HIGH (ref 4.0–10.5)
nRBC: 0 % (ref 0.0–0.2)

## 2021-01-03 LAB — CBG MONITORING, ED: Glucose-Capillary: 161 mg/dL — ABNORMAL HIGH (ref 70–99)

## 2021-01-03 LAB — LACTIC ACID, PLASMA
Lactic Acid, Venous: 2.1 mmol/L (ref 0.5–1.9)
Lactic Acid, Venous: 1.4 mmol/L (ref 0.5–1.9)

## 2021-01-03 MED ORDER — HYDROMORPHONE HCL 4 MG PO TABS: 4.0000 mg | ORAL_TABLET | Freq: Four times a day (QID) | ORAL | 0 refills | Status: DC | PRN

## 2021-01-03 MED ORDER — SODIUM CHLORIDE 0.9 % IV BOLUS
1000.0000 mL | Freq: Once | INTRAVENOUS | Status: AC
  Administered 2021-01-03: 19:00:00 1000 mL via INTRAVENOUS

## 2021-01-03 MED ORDER — ONDANSETRON HCL 4 MG/2ML IJ SOLN
4.0000 mg | Freq: Once | INTRAMUSCULAR | Status: AC
  Administered 2021-01-03: 22:00:00 4 mg via INTRAVENOUS
  Filled 2021-01-03: qty 2

## 2021-01-03 MED ORDER — METHOCARBAMOL 500 MG PO TABS: 500.0000 mg | ORAL_TABLET | Freq: Three times a day (TID) | ORAL | 0 refills | Status: DC | PRN

## 2021-01-03 MED ORDER — FENTANYL CITRATE PF 50 MCG/ML IJ SOSY
50.0000 ug | PREFILLED_SYRINGE | INTRAMUSCULAR | Status: AC | PRN
  Administered 2021-01-03 (×3): 50 ug via INTRAVENOUS
  Filled 2021-01-03 (×3): qty 1

## 2021-01-03 NOTE — ED Provider Notes (Signed)
  Face-to-face evaluation   History: Ongoing left flank pain which feels to her like prior episodes of kidney stone problems.  She is also having some nausea, and not eating as much as usual.  She has not established primary care or urology care in [place].  She was previously in [place] but now is living here.  She is newly on insulin, which was prescribed during recent hospitalization.  She was discharged 2 days ago.  Physical exam: Base alert and cooperative.  She ambulates normally.  There is no dysarthria or aphasia.  She points to the left upper quadrant and left flank as the sites of her pain.  MDM-she is presenting for ongoing flank pain, with vomiting, despite taking medications for UTI and antiemetic.  Evaluation today consistent withUTI, and elevated white blood cell count.  She had a recent complicated hospitalization, treated for headache with possible meningitis, and complications of diabetes.  Urine culture was positive for staph aureus and Streptococcus agalactiae, with symptomatic treatment during hospital course.  He was discharged on Keflex.  11:30 PM-I had a long conversation with Dr. Abner Greenspan, urologist on-call.  He states that patient does not have requirement for stenting at this time.  He recommends discharge with outpatient follow-up.  He can have the patient seen in the "resident clinic", at [facility] u[facility].  She could then be set up for lithotripsy, for treatment of symptomatic renal stones.  He does not think she needs to be admitted for urologic purposes at this time.  He states that the last positive urine culture may have been a contaminant.  Her most recent urine culture on 12/29/2020 had insignificant growth.  11:34 PM-discussed finding and potential plan with the patient.  She is agreeable for discharge at this time.  She would like additional medication, hydromorphone and Robaxin for pain.  I agreed to send these to her pharmacy.  She will call the urology  office in the morning for follow-up appointment.  Medical screening examination/treatment/procedure(s) were conducted as a shared visit with non-physician practitioner(s) and myself.  I personally evaluated the patient during the encounter     Daleen Bo, MD 01/03/21 2339

## 2021-01-03 NOTE — ED Notes (Signed)
Pt currently in CT.  First contact between this writer and pt has not happened yet.

## 2021-01-03 NOTE — Discharge Instructions (Signed)
The urology office can see you.  Call them in the morning for follow-up appointment.  You can increase your hydromorphone dose to 4 mg every 6 hours for pain control.  Drink plenty of fluids.  Continue taking your Keflex, for now.  Return here if needed.

## 2021-01-03 NOTE — ED Provider Notes (Signed)
[facility] DEPT Provider Note   CSN: [id number] Arrival date & time: 01/03/21  1742     History Chief Complaint  Patient presents with   Flank Pain    Jamie Conley is a 45 y.o. female presents to the emergency department with left-sided flank pain.  Patient reports is been ongoing since she was discharged from the hospital on 01/01/2021.  She reports she was sent home with Dilaudid and Robaxin but this has not been helping the pain at all.  She reports the pain is so severe that she is had several syncopal episodes.  Has also had some nausea with intermittent vomiting.  Reports known left-sided kidney stone which she believes is causing the pain.  States she is to follow-up with urology however does not have the finances to do so.  No specific aggravating or alleviating factors.  Denies fevers or chills, hematuria.  Does have some left-sided abdominal pain.  The history is provided by the patient and medical records. No language interpreter was used.      Past Medical History:  Diagnosis Date   Anxiety    Cancer ([place])    Depression    Diabetes mellitus without complication ([place])    High cholesterol    Hypertension    Migraine     Patient Active Problem List   Diagnosis Date Noted   Status migrainosus    Postconcussive syndrome    Hepatic steatosis 12/25/2020   Aortic atherosclerosis ([place]) 12/25/2020   Depression    Hypertension    Migraine    Anxiety    Leukocytosis    Hyperlipidemia    Type 2 diabetes mellitus with hyperglycemia (HCC)    Hypokalemia    Syncopal episodes    Headache 12/24/2020    Past Surgical History:  Procedure Laterality Date   ABDOMINAL HYSTERECTOMY     colon polyectomy     NASAL POLYP SURGERY     SPLENECTOMY     WRIST SURGERY Left      OB History   No obstetric history on file.     Family History  Problem Relation Age of Onset   Hypertension Mother    High Cholesterol Mother    Heart  attack Father     Social History   Tobacco Use   Smoking status: Never   Smokeless tobacco: Never  Vaping Use   Vaping Use: Never used  Substance Use Topics   Alcohol use: Never   Drug use: Yes    Comment: Delta 8 gummies    Home Medications Prior to Admission medications   Medication Sig Start Date End Date Taking? Authorizing Provider  ALPRAZolam Duanne Moron) 1 MG tablet Take 1 tablet (1 mg total) by mouth daily as needed for anxiety. 01/01/21   Amin, Jeanella Flattery, MD  atorvastatin (LIPITOR) 40 MG tablet Take 1 tablet (40 mg total) by mouth daily. 01/01/21   Amin, Jeanella Flattery, MD  blood glucose meter kit and supplies KIT Dispense based on patient and insurance preference. Use up to four times daily as directed. 01/01/21   Amin, Jeanella Flattery, MD  busPIRone (BUSPAR) 5 MG tablet Take 1 tablet (5 mg total) by mouth 3 (three) times daily. 01/01/21 01/31/21  Amin, Jeanella Flattery, MD  cephALEXin (KEFLEX) 500 MG capsule Take 1 capsule (500 mg total) by mouth 4 (four) times daily for 7 days. 01/01/21 01/08/21  Amin, Jeanella Flattery, MD  escitalopram (LEXAPRO) 20 MG tablet Take 1 tablet (20 mg total) by  mouth daily. 01/02/21 02/01/21  Amin, Jeanella Flattery, MD  gabapentin (NEURONTIN) 300 MG capsule Take 1 capsule (300 mg total) by mouth 3 (three) times daily. 01/01/21 01/31/21  Amin, Jeanella Flattery, MD  HYDROmorphone (DILAUDID) 2 MG tablet Take 1 tablet (2 mg total) by mouth every 6 (six) hours as needed for severe pain or moderate pain. 01/01/21   Amin, Jeanella Flattery, MD  insulin isophane & regular human KwikPen (NOVOLIN 70/30 KWIKPEN) (70-30) 100 UNIT/ML KwikPen Inject 18 Units into the skin 2 (two) times daily. 01/01/21 01/31/21  Damita Lack, MD  Insulin Pen Needle (PEN NEEDLES 3/16") 31G X 5 MM MISC Use to inject insulin 4 (four) times daily -  before meals and at bedtime. 01/01/21   Amin, Jeanella Flattery, MD  lisinopril (ZESTRIL) 20 MG tablet Take 1 tablet (20 mg total) by mouth daily. 01/01/21   Amin,  Jeanella Flattery, MD  metFORMIN (GLUCOPHAGE) 1000 MG tablet Take 1 tablet (1,000 mg total) by mouth 2 (two) times daily with a meal. 01/01/21   Amin, Jeanella Flattery, MD  methocarbamol (ROBAXIN) 500 MG tablet Take 1 tablet (500 mg total) by mouth every 6 (six) hours as needed for muscle spasms. 01/01/21   Amin, Ankit Chirag, MD  senna-docusate (SENOKOT-S) 8.6-50 MG tablet Take 1 tablet by mouth at bedtime as needed for moderate constipation. 01/01/21   Amin, Jeanella Flattery, MD  tamsulosin (FLOMAX) 0.4 MG CAPS capsule Take 1 capsule (0.4 mg total) by mouth once a day after supper. 01/01/21   Amin, Jeanella Flattery, MD  traZODone (DESYREL) 50 MG tablet Take 1 tablet (50 mg total) by mouth at bedtime as needed for sleep. 01/01/21 01/31/21  Damita Lack, MD    Allergies    Patient has no known allergies.  Review of Systems   Review of Systems  Constitutional:  Negative for appetite change, diaphoresis, fatigue, fever and unexpected weight change.  HENT:  Negative for mouth sores.   Eyes:  Negative for visual disturbance.  Respiratory:  Negative for cough, chest tightness, shortness of breath and wheezing.   Cardiovascular:  Negative for chest pain.  Gastrointestinal:  Positive for abdominal pain and nausea. Negative for constipation, diarrhea and vomiting.  Endocrine: Negative for polydipsia, polyphagia and polyuria.  Genitourinary:  Positive for flank pain. Negative for dysuria, frequency, hematuria and urgency.  Musculoskeletal:  Negative for back pain and neck stiffness.  Skin:  Negative for rash.  Allergic/Immunologic: Negative for immunocompromised state.  Neurological:  Negative for syncope, light-headedness and headaches.  Hematological:  Does not bruise/bleed easily.  Psychiatric/Behavioral:  Negative for sleep disturbance. The patient is not nervous/anxious.    Physical Exam Updated Vital Signs BP 99/64 (BP Location: Right Arm)   Pulse 82   Temp 98.6 F (37 C) (Oral)   Resp 17   SpO2  97%   Physical Exam Vitals and nursing note reviewed.  Constitutional:      General: She is not in acute distress.    Appearance: She is not diaphoretic.  HENT:     Head: Normocephalic.  Eyes:     General: No scleral icterus.    Conjunctiva/sclera: Conjunctivae normal.  Cardiovascular:     Rate and Rhythm: Normal rate and regular rhythm.     Pulses: Normal pulses.          Radial pulses are 2+ on the right side and 2+ on the left side.  Pulmonary:     Effort: No tachypnea, accessory muscle usage, prolonged expiration, respiratory distress  or retractions.     Breath sounds: Normal breath sounds. No stridor.     Comments: Equal chest rise. No increased work of breathing. Abdominal:     General: There is no distension.     Palpations: Abdomen is soft.     Tenderness: There is abdominal tenderness. There is left CVA tenderness. There is no right CVA tenderness, guarding or rebound.  Musculoskeletal:     Cervical back: Normal range of motion.     Comments: Moves all extremities equally and without difficulty.  Skin:    General: Skin is warm and dry.     Capillary Refill: Capillary refill takes less than 2 seconds.  Neurological:     Mental Status: She is alert.     GCS: GCS eye subscore is 4. GCS verbal subscore is 5. GCS motor subscore is 6.     Comments: Speech is clear and goal oriented.  Psychiatric:        Mood and Affect: Mood normal.    ED Results / Procedures / Treatments   Labs (all labs ordered are listed, but only abnormal results are displayed) Labs Reviewed  CBC WITH DIFFERENTIAL/PLATELET - Abnormal; Notable for the following components:      Result Value   WBC 18.6 (*)    Platelets 466 (*)    Neutro Abs 9.1 (*)    Lymphs Abs 8.0 (*)    All other components within normal limits  COMPREHENSIVE METABOLIC PANEL - Abnormal; Notable for the following components:   Glucose, Bld 199 (*)    BUN 33 (*)    AST 14 (*)    All other components within normal limits   LACTIC ACID, PLASMA - Abnormal; Notable for the following components:   Lactic Acid, Venous 2.1 (*)    All other components within normal limits  LACTIC ACID, PLASMA  URINALYSIS, ROUTINE W REFLEX MICROSCOPIC  PATHOLOGIST SMEAR REVIEW    Radiology CT Renal Stone Study  Result Date: 01/03/2021 CLINICAL DATA:  Left flank pain worsening today. Patient reports a recent admission for a known kidney stone. EXAM: CT ABDOMEN AND PELVIS WITHOUT CONTRAST TECHNIQUE: Multidetector CT imaging of the abdomen and pelvis was performed following the standard protocol without IV contrast. COMPARISON:  12/28/2020 FINDINGS: Lower chest: No acute abnormality. Hepatobiliary: Liver top normal in size. Diffuse decreased liver attenuation consistent with fatty infiltration. No liver mass or focal lesion. Normal gallbladder. No bile duct dilation. Pancreas: Unremarkable. No pancreatic ductal dilatation or surrounding inflammatory changes. Spleen: Status post splenectomy. Adrenals/Urinary Tract: No adrenal masses. Kidneys normal in size, orientation and position. No renal masses. Nonobstructing stones in the lower pole the left kidney, combined stones Pam 1.1 cm, unchanged from the prior CT. No other intrarenal stones. No hydronephrosis. Normal ureters. Bladder decompressed, otherwise unremarkable. Stomach/Bowel: Stomach is within normal limits. Appendix appears normal. No evidence of bowel wall thickening, distention, or inflammatory changes. Vascular/Lymphatic: Mild aortic atherosclerosis. No aneurysm. No enlarged lymph nodes. Reproductive: Status post hysterectomy. No adnexal masses. Other: Fat containing, small, midline, paraumbilical hernia, unchanged. No ascites. Musculoskeletal: No acute or significant osseous findings. IMPRESSION: 1. No acute findings.  No ureteral stones or obstructive uropathy. 2. 11 mm span of contiguous stones in the lower pole of the left kidney, unchanged from the prior CT. 3. Hepatic steatosis. 4.  Mild aortic atherosclerosis. Electronically Signed   By: Lajean Manes M.D.   On: 01/03/2021 18:46    Procedures Procedures   Medications Ordered in ED Medications  fentaNYL (SUBLIMAZE)  injection 50 mcg (50 mcg Intravenous Given 01/03/21 2031)  ondansetron [facility]) injection 4 mg (has no administration in time range)  sodium chloride 0.9 % bolus 1,000 mL (1,000 mLs Intravenous New Bag/Given 01/03/21 1929)    ED Course  I have reviewed the triage vital signs and the nursing notes.  Pertinent labs & imaging results that were available during my care of the patient were reviewed by me and considered in my medical decision making (see chart for details).  Clinical Course as of 01/03/21 2128  Sun Jan 03, 2021  2011 Lactic Acid, Venous: 1.4 Improved with fluids [HM]  2117 BP(!): 97/55 BP remains soft [HM]  2118 WBC(!): 18.6 elevated [HM]  2118 CT Renal Stone Study No change in stone location or size [HM]    Clinical Course User Index [HM] Krisalyn Yankowski, Gwenlyn Perking   MDM Rules/Calculators/A&P                           Patient presents with left-sided flank pain.  Has been taking Dilaudid at home without improvement.  Was recently hospitalized for several issues and found to have UTI along with 11 mm nephrolithiasis.  CT scan today shows stone is unchanged. Leukocytosis noted and elevated lactic.  Pt remains afebrile.   9:26 PM Patient continues to have severe pain and nausea.  Soft BP's.  Lactic acid cleared with fluids.  Awaiting UA.  Pt will need consult with urology and admission for severe pain and nausea.    The patient was discussed with and evaluated by Dr. Eulis Foster who agrees with the treatment plan. He will follow studies and consults.    Final Clinical Impression(s) / ED Diagnoses Final diagnoses:  Left flank pain  Nephrolithiasis    Rx / DC Orders ED Discharge Orders     None        Loni Muse Gwenlyn Perking 01/03/21 2128    Daleen Bo,  MD 01/04/21 1037

## 2021-01-03 NOTE — ED Triage Notes (Signed)
PT c/o L flank pain worsening today. States recent admission with known 60mm kidney stone. Reports taking dilaudid and muscle relaxer with less relief today.

## 2021-01-03 NOTE — ED Notes (Signed)
Patient provided with saltines and chicken broth

## 2021-01-04 ENCOUNTER — Other Ambulatory Visit: Payer: Self-pay

## 2021-01-05 LAB — URINE CULTURE: Culture: <10000 — AB

## 2021-01-07 LAB — PATHOLOGIST SMEAR REVIEW

## 2021-01-08 ENCOUNTER — Other Ambulatory Visit: Payer: Self-pay | Admitting: Urology

## 2021-01-08 ENCOUNTER — Encounter (HOSPITAL_COMMUNITY): Payer: Self-pay | Admitting: Urology

## 2021-01-08 ENCOUNTER — Other Ambulatory Visit: Payer: Self-pay

## 2021-01-08 NOTE — Progress Notes (Addendum)
For Short Stay: [facility] appointment date: N/A Date of COVID positive in last 59 days:N/A   For Anesthesia: PCP - N/A Cardiologist - N/A  Chest x-ray - 12/25/20 in epic EKG - 12/25/20 in epic Stress Test - N/A ECHO - 12/25/20 in epic Cardiac Cath - N/A Pacemaker/ICD device last checked:N/A  Sleep Study - Yes CPAP - N/A  Fasting Blood Sugar - 180's Checks Blood Sugar __4___ times a day Hgb A1c 11.4 12/25/20  Blood Thinner Instructions: N/A Aspirin Instructions: N/A Last Dose: N/A  Activity level: Able to exercise without chest pain and/or shortness of breath      Anesthesia review: Diabetic Hgb A1c 11.4 12/25/20  Patient denies shortness of breath, fever, cough and chest pain at PAT appointment   Patient verbalized understanding of instructions that were given to them at the PAT appointment. Patient was also instructed that they will need to review over the PAT instructions again at home before surgery.

## 2021-01-11 NOTE — H&P (Signed)
Urology H+P Note   Chief Complaint:  left renal stone   HPI: Jamie Conley is a 45 y.o. female with left 99m lower pole stone which was discovered on CT scan obtained in the ED for left flank pain. She reports a history of >25 stones previously with many ureteroscopic stone extractions. She was seen in clinic 12/1 and elected to proceed with cystoscopy, left ureteroscopy with laser lithotripsy and stone extraction. She had a negative urine culture on 12/1.    Past Medical History: Past Medical History:  Diagnosis Date   Anxiety    Asthma    [place]cer ([place]    Depression    [place]    Enlarged liver    Fibromyalgia    High cholesterol    History of kidney stones    Hypertension    Migraine     Past Surgical History:  Past Surgical History:  Procedure Laterality Date   ABDOMINAL HYSTERECTOMY     ANTERIOR AND POSTERIOR REPAIR     colon polyectomy     LEFT OOPHORECTOMY     LITHOTRIPSY     NASAL POLYP SURGERY     SPLENECTOMY     WRIST SURGERY Left     Medication: No current facility-administered medications for this encounter.   Current Outpatient Medications  Medication Sig Dispense Refill   ALPRAZolam (XANAX) 1 MG tablet Take 1 tablet (1 mg total) by mouth daily as needed for anxiety. 10 tablet 0   atorvastatin (LIPITOR) 40 MG tablet Take 1 tablet (40 mg total) by mouth daily. 30 tablet 0   blood glucose meter kit and supplies KIT Dispense based on patient and insurance preference. Use up to four times daily as directed. 1 each 0   busPIRone (BUSPAR) 5 MG tablet Take 1 tablet (5 mg total) by mouth 3 (three) times daily. 90 tablet 0   escitalopram (LEXAPRO) 20 MG tablet Take 1 tablet (20 mg total) by mouth daily. 30 tablet 0   gabapentin (NEURONTIN) 300 MG capsule Take 1 capsule (300 mg total) by mouth 3 (three) times daily. 90 capsule 0   HYDROmorphone (DILAUDID) 2 MG tablet Take 1 tablet (2 mg total) by mouth every 6 (six)  hours as needed for severe pain or moderate pain. 15 tablet 0   HYDROmorphone (DILAUDID) 4 MG tablet Take 1 tablet (4 mg total) by mouth every 6 (six) hours as needed for severe pain. 15 tablet 0   insulin isophane & regular human KwikPen (NOVOLIN 70/30 KWIKPEN) (70-30) 100 UNIT/ML KwikPen Inject 18 Units into the skin 2 (two) times daily. 10.8 mL 0   Insulin Pen Needle (PEN NEEDLES 3/16") 31G X 5 MM MISC Use to inject insulin 4 (four) times daily -  before meals and at bedtime. 100 each 0   lisinopril (ZESTRIL) 20 MG tablet Take 1 tablet (20 mg total) by mouth daily. 30 tablet 0   metFORMIN (GLUCOPHAGE) 1000 MG tablet Take 1 tablet (1,000 mg total) by mouth 2 (two) times daily with a meal. 60 tablet 0   methocarbamol (ROBAXIN) 500 MG tablet Take 1 tablet (500 mg total) by mouth every 6 (six) hours as needed for muscle spasms. 10 tablet 0   methocarbamol (ROBAXIN) 500 MG tablet Take 1 tablet (500 mg total) by mouth every 8 (eight) hours as needed for muscle spasms. 20 tablet 0   senna-docusate (SENOKOT-S) 8.6-50 MG tablet Take 1 tablet by mouth at bedtime as needed for moderate constipation. 3[place]  tablet 0   tamsulosin (FLOMAX) 0.4 MG CAPS capsule Take 1 capsule (0.4 mg total) by mouth once a day after supper. 30 capsule 0   traZODone (DESYREL) 50 MG tablet Take 1 tablet (50 mg total) by mouth at bedtime as needed for sleep. 30 tablet 0    Allergies: No Known Allergies  Social History: Social History   Tobacco Use   Smoking status: Never   Smokeless tobacco: Never  Vaping Use   Vaping Use: Never used  Substance Use Topics   Alcohol use: Never   Drug use: Yes    Comment: Delta 8 gummies    Family History Family History  Problem Relation Age of Onset   Hypertension Mother    High Cholesterol Mother    Heart attack Father     Review of Systems 10 systems were reviewed and are negative except as noted specifically in the HPI.  Objective   Vital signs in last 24 hours: Ht '5\' 3"'   (1.6 m)   Wt 94.3 kg   BMI 36.85 kg/m   Physical Exam General: NAD, A&O, resting, appropriate HEENT: [place]/AT, EOMI, MMM Pulmonary: Normal work of breathing Cardiovascular: HDS, adequate peripheral perfusion Abdomen: Soft, NTTP, nondistended. GU: Voiding spontaneously,  CVA tenderness Extremities: warm and well perfused Neuro: Appropriate, no focal neurological deficits  Most Recent Labs: Lab Results  Component Value Date   WBC 18.6 (H) 01/03/2021   HGB 13.4 01/03/2021   HCT 40.3 01/03/2021   PLT 466 (H) 01/03/2021    Lab Results  Component Value Date   NA 136 01/03/2021   K 3.8 01/03/2021   CL 104 01/03/2021   CO2 22 01/03/2021   BUN 33 (H) 01/03/2021   CREATININE 0.89 01/03/2021   CALCIUM 9.4 01/03/2021   MG 1.6 (L) 01/01/2021   PHOS 4.8 (H) 12/30/2020    No results found for: INR, APTT   IMAGING:  CT A/P 11/27 IMPRESSION: 1. No acute findings.  No ureteral stones or obstructive uropathy. 2. 11 mm span of contiguous stones in the lower pole of the left kidney, unchanged from the prior CT. 3. Hepatic steatosis. 4. Mild aortic atherosclerosis.     Assessment:  45 y.o. female with 39m left lower pole calculus    Plan: - Proceed with cystoscopy, left ureteroscopy with laser lithotripsy and stone extraction as planned.

## 2021-01-11 NOTE — Progress Notes (Signed)
Anesthesia Chart Review   Case: [id number] Date/Time: 01/12/21 1115   Procedure: CYSTOSCOPY/URETEROSCOPY/HOLMIUM LASER/STENT PLACEMENT (Left)   Anesthesia type: General   Pre-op diagnosis: LEFT RENAL STONE   Location: [facility] ROOM 05 / [facility]   Surgeons: Vira Agar, MD       DISCUSSION:45 y.o. never smoker with h/o HTN, DM II, asthma, left renal stone scheduled for above procedure 01/12/2021 with Dr. Terrilee Files.   Poorly controlled diabetes.  Most recent A1C 11.4. Insulin started in addition to metformin during recent admission 11/17-11/25/2022. Pt same day workup, CBG DOS.   Anticipate pt can proceed with planned procedure barring acute status change and after evaluation DOS.  VS: Ht $Remo'5\' 3"'kTEhg$  (1.6 m)   Wt 94.3 kg   BMI 36.85 kg/m   PROVIDERS: Pcp, No   LABS: Labs reviewed: Acceptable for surgery. (all labs ordered are listed, but only abnormal results are displayed)  Labs Reviewed - No data to display   IMAGES:   EKG: 12/25/2020 Rate 82 bpm  NSR Nonspecific T wave abnormality  CV: Echo 12/25/2020 1. Left ventricular ejection fraction, by estimation, is 55 to 60%. Left  ventricular ejection fraction by PLAX is 62 %. The left ventricle has  normal function. The left ventricle has no regional wall motion  abnormalities. There is mild concentric left  ventricular hypertrophy. Left ventricular diastolic parameters are  consistent with Grade II diastolic dysfunction (pseudonormalization).   2. Right ventricular systolic function is normal. The right ventricular  size is normal. There is normal pulmonary artery systolic pressure.   3. The mitral valve is normal in structure. Trivial mitral valve  regurgitation. No evidence of mitral stenosis.   4. The aortic valve is tricuspid. Aortic valve regurgitation is not  visualized. No aortic stenosis is present.   5. The inferior vena cava is normal in size with <50% respiratory  variability, suggesting right atrial pressure  of 8 mmHg.  Past Medical History:  Diagnosis Date   Anxiety    Asthma    Cancer ([place])    Depression    Diabetes mellitus without complication ([place])    Enlarged liver    Fibromyalgia    High cholesterol    History of kidney stones    Hypertension    Migraine     Past Surgical History:  Procedure Laterality Date   ABDOMINAL HYSTERECTOMY     ANTERIOR AND POSTERIOR REPAIR     colon polyectomy     LEFT OOPHORECTOMY     LITHOTRIPSY     NASAL POLYP SURGERY     SPLENECTOMY     WRIST SURGERY Left     MEDICATIONS: No current facility-administered medications for this encounter.    ALPRAZolam (XANAX) 1 MG tablet   atorvastatin (LIPITOR) 40 MG tablet   blood glucose meter kit and supplies KIT   busPIRone (BUSPAR) 5 MG tablet   escitalopram (LEXAPRO) 20 MG tablet   gabapentin (NEURONTIN) 300 MG capsule   HYDROmorphone (DILAUDID) 2 MG tablet   HYDROmorphone (DILAUDID) 4 MG tablet   insulin isophane & regular human KwikPen (NOVOLIN 70/30 KWIKPEN) (70-30) 100 UNIT/ML KwikPen   Insulin Pen Needle (PEN NEEDLES 3/16") 31G X 5 MM MISC   lisinopril (ZESTRIL) 20 MG tablet   metFORMIN (GLUCOPHAGE) 1000 MG tablet   methocarbamol (ROBAXIN) 500 MG tablet   methocarbamol (ROBAXIN) 500 MG tablet   senna-docusate (SENOKOT-S) 8.6-50 MG tablet   tamsulosin (FLOMAX) 0.4 MG CAPS capsule   traZODone (DESYREL) 50 MG tablet  Konrad Felix Ward, PA-C [facility]Surgical Testing [phone]

## 2021-01-12 ENCOUNTER — Ambulatory Visit (HOSPITAL_COMMUNITY): Payer: Self-pay

## 2021-01-12 ENCOUNTER — Ambulatory Visit (HOSPITAL_COMMUNITY): Payer: Self-pay | Admitting: Physician Assistant

## 2021-01-12 ENCOUNTER — Ambulatory Visit (HOSPITAL_COMMUNITY)
Admission: RE | Admit: 2021-01-12 | Discharge: 2021-01-12 | Disposition: A | Discharge status: Home / Self Care | Payer: Self-pay | Attending: Urology | Admitting: Urology

## 2021-01-12 ENCOUNTER — Encounter (HOSPITAL_COMMUNITY)
Admission: RE | Disposition: A | Discharge status: Home / Self Care | Payer: Self-pay | Source: Home / Self Care | Attending: Urology

## 2021-01-12 ENCOUNTER — Encounter (HOSPITAL_COMMUNITY): Payer: Self-pay | Admitting: Urology

## 2021-01-12 DIAGNOSIS — F419 Anxiety disorder, unspecified: Secondary | ICD-10-CM | POA: Insufficient documentation

## 2021-01-12 DIAGNOSIS — I10 Essential (primary) hypertension: Secondary | ICD-10-CM | POA: Insufficient documentation

## 2021-01-12 DIAGNOSIS — F1120 Opioid dependence, uncomplicated: Secondary | ICD-10-CM | POA: Insufficient documentation

## 2021-01-12 DIAGNOSIS — J45909 Unspecified asthma, uncomplicated: Secondary | ICD-10-CM | POA: Insufficient documentation

## 2021-01-12 DIAGNOSIS — Z87442 Personal history of urinary calculi: Secondary | ICD-10-CM | POA: Insufficient documentation

## 2021-01-12 DIAGNOSIS — N200 Calculus of kidney: Secondary | ICD-10-CM | POA: Insufficient documentation

## 2021-01-12 DIAGNOSIS — Z794 Long term (current) use of insulin: Secondary | ICD-10-CM | POA: Insufficient documentation

## 2021-01-12 DIAGNOSIS — F32A Depression, unspecified: Secondary | ICD-10-CM | POA: Insufficient documentation

## 2021-01-12 DIAGNOSIS — M797 Fibromyalgia: Secondary | ICD-10-CM | POA: Insufficient documentation

## 2021-01-12 DIAGNOSIS — Z7984 Long term (current) use of oral hypoglycemic drugs: Secondary | ICD-10-CM | POA: Insufficient documentation

## 2021-01-12 DIAGNOSIS — E119 Type 2 diabetes mellitus without complications: Secondary | ICD-10-CM | POA: Insufficient documentation

## 2021-01-12 HISTORY — DX: Fibromyalgia: M79.7

## 2021-01-12 HISTORY — PX: CYSTOSCOPY/URETEROSCOPY/HOLMIUM LASER/STENT PLACEMENT: SHX6546

## 2021-01-12 HISTORY — DX: Unspecified asthma, uncomplicated: J45.909

## 2021-01-12 HISTORY — DX: Hepatomegaly, not elsewhere classified: R16.0

## 2021-01-12 HISTORY — DX: Personal history of urinary calculi: Z87.442

## 2021-01-12 LAB — GLUCOSE, CAPILLARY
Glucose-Capillary: 262 mg/dL — ABNORMAL HIGH (ref 70–99)
Glucose-Capillary: 221 mg/dL — ABNORMAL HIGH (ref 70–99)

## 2021-01-12 SURGERY — CYSTOSCOPY/URETEROSCOPY/HOLMIUM LASER/STENT PLACEMENT
Anesthesia: General | Laterality: Left

## 2021-01-12 MED ORDER — TAMSULOSIN HCL 0.4 MG PO CAPS: 0.4000 mg | ORAL_CAPSULE | Freq: Every evening | ORAL | 0 refills | Status: DC

## 2021-01-12 MED ORDER — OXYCODONE HCL 5 MG PO TABS
5.0000 mg | ORAL_TABLET | Freq: Once | ORAL | Status: AC | PRN
  Administered 2021-01-12: 5 mg via ORAL

## 2021-01-12 MED ORDER — MIDAZOLAM HCL 2 MG/2ML IJ SOLN
INTRAMUSCULAR | Status: DC | PRN
  Administered 2021-01-12: 2 mg via INTRAVENOUS

## 2021-01-12 MED ORDER — DEXAMETHASONE SODIUM PHOSPHATE 10 MG/ML IJ SOLN
INTRAMUSCULAR | Status: AC
  Filled 2021-01-12: qty 1

## 2021-01-12 MED ORDER — PHENYLEPHRINE 40 MCG/ML (10ML) SYRINGE FOR IV PUSH (FOR BLOOD PRESSURE SUPPORT)
PREFILLED_SYRINGE | INTRAVENOUS | Status: DC | PRN
  Administered 2021-01-12: 80 ug via INTRAVENOUS

## 2021-01-12 MED ORDER — MIDAZOLAM HCL 2 MG/2ML IJ SOLN
INTRAMUSCULAR | Status: AC
  Filled 2021-01-12: qty 2

## 2021-01-12 MED ORDER — FENTANYL CITRATE (PF) 100 MCG/2ML IJ SOLN
INTRAMUSCULAR | Status: DC | PRN
  Administered 2021-01-12 (×7): 25 ug via INTRAVENOUS
  Administered 2021-01-12 (×2): 50 ug via INTRAVENOUS
  Administered 2021-01-12: 25 ug via INTRAVENOUS

## 2021-01-12 MED ORDER — OXYCODONE HCL 5 MG PO TABS
ORAL_TABLET | ORAL | Status: AC
  Filled 2021-01-12: qty 1

## 2021-01-12 MED ORDER — PROMETHAZINE HCL 25 MG/ML IJ SOLN: 6.2500 mg | INTRAMUSCULAR | Status: DC | PRN

## 2021-01-12 MED ORDER — KETOROLAC TROMETHAMINE 10 MG PO TABS: 10.0000 mg | ORAL_TABLET | Freq: Four times a day (QID) | ORAL | 0 refills | Status: DC | PRN

## 2021-01-12 MED ORDER — SODIUM CHLORIDE 0.9 % IR SOLN
Status: DC | PRN
  Administered 2021-01-12: 3000 mL via INTRAVESICAL

## 2021-01-12 MED ORDER — LIDOCAINE 2% (20 MG/ML) 5 ML SYRINGE
INTRAMUSCULAR | Status: DC | PRN
  Administered 2021-01-12: 80 mg via INTRAVENOUS

## 2021-01-12 MED ORDER — ORAL CARE MOUTH RINSE: 15.0000 mL | Freq: Once | OROMUCOSAL | Status: AC

## 2021-01-12 MED ORDER — PHENYLEPHRINE 40 MCG/ML (10ML) SYRINGE FOR IV PUSH (FOR BLOOD PRESSURE SUPPORT)
PREFILLED_SYRINGE | INTRAVENOUS | Status: AC
  Filled 2021-01-12: qty 10

## 2021-01-12 MED ORDER — NITROFURANTOIN MONOHYD MACRO 100 MG PO CAPS: 100.0000 mg | ORAL_CAPSULE | Freq: Two times a day (BID) | ORAL | 0 refills | Status: AC

## 2021-01-12 MED ORDER — LACTATED RINGERS IV SOLN: INTRAVENOUS | Status: DC

## 2021-01-12 MED ORDER — DEXAMETHASONE SODIUM PHOSPHATE 4 MG/ML IJ SOLN
INTRAMUSCULAR | Status: DC | PRN
  Administered 2021-01-12: 5 mg via INTRAVENOUS

## 2021-01-12 MED ORDER — FENTANYL CITRATE (PF) 100 MCG/2ML IJ SOLN
INTRAMUSCULAR | Status: AC
  Filled 2021-01-12: qty 2

## 2021-01-12 MED ORDER — PROPOFOL 10 MG/ML IV BOLUS
INTRAVENOUS | Status: DC | PRN
  Administered 2021-01-12: 200 mg via INTRAVENOUS

## 2021-01-12 MED ORDER — FENTANYL CITRATE PF 50 MCG/ML IJ SOSY: 25.0000 ug | PREFILLED_SYRINGE | INTRAMUSCULAR | Status: DC | PRN

## 2021-01-12 MED ORDER — CEFAZOLIN SODIUM-DEXTROSE 2-4 GM/100ML-% IV SOLN
2.0000 g | INTRAVENOUS | Status: AC
  Administered 2021-01-12: 2 g via INTRAVENOUS
  Filled 2021-01-12: qty 100

## 2021-01-12 MED ORDER — CHLORHEXIDINE GLUCONATE 0.12 % MT SOLN
15.0000 mL | Freq: Once | OROMUCOSAL | Status: AC
  Administered 2021-01-12: 15 mL via OROMUCOSAL

## 2021-01-12 MED ORDER — HYDROMORPHONE HCL 4 MG PO TABS: 4.0000 mg | ORAL_TABLET | Freq: Four times a day (QID) | ORAL | 0 refills | Status: DC | PRN

## 2021-01-12 MED ORDER — PHENAZOPYRIDINE HCL 200 MG PO TABS: 200.0000 mg | ORAL_TABLET | Freq: Three times a day (TID) | ORAL | 0 refills | Status: AC | PRN

## 2021-01-12 MED ORDER — INSULIN ASPART 100 UNIT/ML IJ SOLN: 5.0000 [IU] | Freq: Once | INTRAMUSCULAR | Status: AC

## 2021-01-12 MED ORDER — INSULIN ASPART 100 UNIT/ML IJ SOLN
INTRAMUSCULAR | Status: AC
  Administered 2021-01-12: 5 [IU] via SUBCUTANEOUS
  Filled 2021-01-12: qty 1

## 2021-01-12 MED ORDER — ONDANSETRON HCL 4 MG/2ML IJ SOLN
INTRAMUSCULAR | Status: DC | PRN
  Administered 2021-01-12: 4 mg via INTRAVENOUS

## 2021-01-12 MED ORDER — OXYCODONE HCL 5 MG/5ML PO SOLN: 5.0000 mg | Freq: Once | ORAL | Status: AC | PRN

## 2021-01-12 MED ORDER — IOHEXOL 300 MG/ML  SOLN
INTRAMUSCULAR | Status: DC | PRN
  Administered 2021-01-12: 10 mL via URETHRAL

## 2021-01-12 MED ORDER — OXYBUTYNIN CHLORIDE 5 MG PO TABS: 5.0000 mg | ORAL_TABLET | Freq: Three times a day (TID) | ORAL | 0 refills | Status: DC | PRN

## 2021-01-12 MED ORDER — ONDANSETRON HCL 4 MG/2ML IJ SOLN
INTRAMUSCULAR | Status: AC
  Filled 2021-01-12: qty 2

## 2021-01-12 SURGICAL SUPPLY — 28 items
BAG COUNTER SPONGE SURGICOUNT (BAG) IMPLANT
BAG SPNG CNTER NS LX DISP (BAG)
BAG URO CATCHER STRL LF (MISCELLANEOUS) ×2 IMPLANT
BASKET ZERO TIP NITINOL 2.4FR (BASKET) ×2 IMPLANT
BSKT STON RTRVL ZERO TP 2.4FR (BASKET) ×1
CATH URETL 5X70 OPEN END (CATHETERS) ×2 IMPLANT
CATH URETL OPEN END 6FR 70 (CATHETERS) IMPLANT
CLOTH BEACON ORANGE TIMEOUT ST (SAFETY) ×2 IMPLANT
GLOVE SURG ENC MOIS LTX SZ7 (GLOVE) ×2 IMPLANT
GLOVE SURG ENC TEXT LTX SZ7.5 (GLOVE) IMPLANT
GLOVE SURG ORTHO LTX SZ7 (GLOVE) ×2 IMPLANT
GLOVE SURG POLYISO LF SZ7 (GLOVE) ×2 IMPLANT
GLOVE SURG UNDER POLY LF SZ7 (GLOVE) ×2 IMPLANT
GLOVE SURG UNDER POLY LF SZ7.5 (GLOVE) ×2 IMPLANT
GOWN STRL REUS W/TWL LRG LVL3 (GOWN DISPOSABLE) ×6 IMPLANT
GUIDEWIRE STR DUAL SENSOR (WIRE) ×2 IMPLANT
GUIDEWIRE ZIPWRE .038 STRAIGHT (WIRE) ×2 IMPLANT
IV NS 1000ML (IV SOLUTION) ×2
IV NS 1000ML BAXH (IV SOLUTION) ×1 IMPLANT
KIT TURNOVER KIT A (KITS) IMPLANT
LASER FIB FLEXIVA PULSE ID 365 (Laser) IMPLANT
MANIFOLD NEPTUNE II (INSTRUMENTS) ×2 IMPLANT
PACK CYSTO (CUSTOM PROCEDURE TRAY) ×2 IMPLANT
SHEATH NAVIGATOR HD 11/13X36 (SHEATH) ×2 IMPLANT
TRACTIP FLEXIVA PULS ID 200XHI (Laser) IMPLANT
TRACTIP FLEXIVA PULSE ID 200 (Laser)
TUBING CONNECTING 10 (TUBING) ×2 IMPLANT
TUBING UROLOGY SET (TUBING) ×2 IMPLANT

## 2021-01-12 NOTE — Interval H&P Note (Signed)
History and Physical Interval Note:  01/12/2021 9:47 AM  Jamie Conley  has presented today for surgery, with the diagnosis of LEFT RENAL STONE.  The various methods of treatment have been discussed with the patient and family. After consideration of risks, benefits and other options for treatment, the patient has consented to  Procedure(s): CYSTOSCOPY/URETEROSCOPY/HOLMIUM LASER/STENT PLACEMENT (Left) as a surgical intervention.  The patient's history has been reviewed, patient examined, no change in status, stable for surgery.  I have reviewed the patient's chart and labs.  Questions were answered to the patient's satisfaction.     Ruven Corradi L Paula Busenbark

## 2021-01-12 NOTE — Anesthesia Preprocedure Evaluation (Addendum)
Anesthesia Evaluation  Patient identified by MRN, date of birth, ID band Patient awake    Reviewed: Allergy & Precautions, NPO status , Patient's Chart, lab work & pertinent test results  History of Anesthesia Complications Negative for: history of anesthetic complications  Airway Mallampati: II  TM Distance: >3 FB Neck ROM: Full    Dental  (+) Dental Advisory Given, Partial Upper   Pulmonary asthma ,    Pulmonary exam normal        Cardiovascular hypertension, Pt. on medications Normal cardiovascular exam     Neuro/Psych  Headaches, PSYCHIATRIC DISORDERS Anxiety Depression    GI/Hepatic negative GI ROS, Neg liver ROS,   Endo/Other  diabetes, Type 2, Insulin Dependent, Oral Hypoglycemic Agents Obesity   Renal/GU negative Renal ROS     Musculoskeletal  (+) Fibromyalgia -, narcotic dependent  Abdominal   Peds  Hematology  S/p splenectomy    Anesthesia Other Findings   Reproductive/Obstetrics                           Anesthesia Physical Anesthesia Plan  ASA: 2  Anesthesia Plan: General   Post-op Pain Management:    Induction: Intravenous  PONV Risk Score and Plan: 3 and Treatment may vary due to age or medical condition, Ondansetron, Dexamethasone and Midazolam  Airway Management Planned: LMA  Additional Equipment: None  Intra-op Plan:   Post-operative Plan: Extubation in OR  Informed Consent: I have reviewed the patients History and Physical, chart, labs and discussed the procedure including the risks, benefits and alternatives for the proposed anesthesia with the patient or authorized representative who has indicated his/her understanding and acceptance.     Dental advisory given  Plan Discussed with: CRNA and Anesthesiologist  Anesthesia Plan Comments:        Anesthesia Quick Evaluation

## 2021-01-12 NOTE — Anesthesia Procedure Notes (Signed)
Procedure Name: LMA Insertion Date/Time: 01/12/2021 12:10 PM Performed by: Claudia Desanctis, CRNA Pre-anesthesia Checklist: Emergency Drugs available, Patient identified, Suction available and Patient being monitored Patient Re-evaluated:Patient Re-evaluated prior to induction Oxygen Delivery Method: Circle system utilized Preoxygenation: Pre-oxygenation with 100% oxygen Induction Type: IV induction Ventilation: Mask ventilation without difficulty LMA: LMA inserted LMA Size: 4.0 Number of attempts: 1 Placement Confirmation: positive ETCO2 and breath sounds checked- equal and bilateral Tube secured with: Tape Dental Injury: Teeth and Oropharynx as per pre-operative assessment

## 2021-01-12 NOTE — Interval H&P Note (Signed)
History and Physical Interval Note:  01/12/2021 11:35 AM  Jamie Conley  has presented today for surgery, with the diagnosis of LEFT RENAL STONE.  The various methods of treatment have been discussed with the patient and family. After consideration of risks, benefits and other options for treatment, the patient has consented to  Procedure(s): CYSTOSCOPY/URETEROSCOPY/HOLMIUM LASER/STENT PLACEMENT (Left) as a surgical intervention.  The patient's history has been reviewed, patient examined, no change in status, stable for surgery.  I have reviewed the patient's chart and labs.  Questions were answered to the patient's satisfaction.     [name]

## 2021-01-12 NOTE — Transfer of Care (Signed)
Immediate Anesthesia Transfer of Care Note  Patient: Jamie Conley  Procedure(s) Performed: CYSTOSCOPY/URETEROSCOPY/HOLMIUM LASER/STENT PLACEMENT (Left)  Patient Location: PACU  Anesthesia Type:General  Level of Consciousness: awake and patient cooperative  Airway & Oxygen Therapy: Patient Spontanous Breathing and Patient connected to face mask  Post-op Assessment: Report given to RN and Post -op Vital signs reviewed and stable  Post vital signs: Reviewed and stable  Last Vitals:  Vitals Value Taken Time  BP 125/80 01/12/21 1352  Temp    Pulse 87 01/12/21 1352  Resp 16 01/12/21 1354  SpO2 95 % 01/12/21 1352  Vitals shown include unvalidated device data.  Last Pain:  Vitals:   01/12/21 0949  TempSrc:   PainSc: 8       Patients Stated Pain Goal: 2 (55/25/89 4834)  Complications: No notable events documented.

## 2021-01-12 NOTE — Op Note (Signed)
Preoperative Diagnosis: Left nephrolithiasis  Postoperative Diagnosis:  Same  Procedure(s) Performed:   - Cystourethroscopy - Left ureteroscopic stone extraction with laser lithotripsy - Left retrograde pyelogram - Left ureteral stent placement - Intraoperative fluoroscopy with interpretation <1hr.   Teaching Surgeon:  Terrilee Files, MD  Resident Surgeon:  [name], MD - PGY4  Assistant(s):  None  Anesthesia:  General  Fluids:  See anesthesia record  Estimated blood loss:  0cc  Specimens:  Stone for analysis  Drains:  Left 6Fr x 24cm JJ ureteral stent with dangler  Complications:  None  Indications: 45 y.o. patient with a history of DM and nephrolithiasis who presented with left flank pain. She underwent CT imaging which showed 79mm left lower pole stone and no ureteral stones. She presents for left ureteroscopic stone extraction with laser lithotripsy and stent placement. Risks & benefits of the procedure discussed with the patient, who wishes to proceed.  Findings:   -Normal appearing urethra and bladder mucosa without lesions, masses or stones  -Bilateral orthotopic ureteral orifices, patulous, clear efflux from both -2 large stones and 2 small stones encountered in a lower pole calyx -3 stones relocated to the upper pole and dusted in their entirety. No remaining fragment was >67mm  -One very small (1-58mm) stone was seen in a lower pole calyx but we were unable to access this with either the basket or the laser given the orientation of the calyx and how the stone was sitting   Radiologic Interpretation of Retrograde Pyelogram: Left retrograde pyelogram demonstrated no contrast extravasation, filling defects or evidence of hydroureteronephrosis. KUB post treatment showed appropriate curls proximally in the renal pelvis and distally in the bladder   Description:  The patient was correctly identified in the preop holding area where written informed consent as well  potential risk and complication reviewed. The patient agreed. They were brought back to the operative suite where a preinduction timeout was performed. Once correct information was verified, general anesthesia was induced. They were then gently placed into dorsal lithotomy position with SCDs in place for VTE prophylaxis. They were prepped and draped in the usual sterile fashion and given appropriate preoperative antibiotics. A second timeout was then performed.   We inserted a 14F rigid cystoscope per urethra with copious lubrication and normal saline irrigation running. This demonstrated findings as described above.    We cannulated the left ureteral orifice with the combination of a sensor wire and 5Fr open ended catheter and the sensor wire was advanced into the expected location of the renal pelvis without difficulty. We were able to visualize a radioopacity on fluoroscopy in the lower pole. A retrograde pyelogram was performed with findings as noted above. We then replaced the sensor wire into the right kidney and the 5Fr open ended catheter was removed. We then removed the cystoscope leaving our sensor wire in place. We used a dual lumen catheter to place a second working wire.   We then obtained a 6Fr x 24cm ureteral access sheath which was easily passed over the sensor wire into the proximal ureter just distal to the UPJ under fluoroscopic guidance . The inner cannula was removed and was replaced by the flexible ureteroscope and ureteroscopy was performed. This demonstrated a fairly tight UPJ but allowed the scope to pass. We then performed pan pyeloscopy which showed 4 stones in a lower pole calyx. We used a basket to relocated 3 of these stones to the upper pole. The 4th was very small and tucked anteriorly within  the calyx and we were unable to reach it safely with the basket   We then obtained a 200 micron holmium laser fiber and performed laser lithotripsy until all of the stone burden was  fragmented into fine dust particles. There was no fragment that appeared >62mm. We sent a few stone fragments for chemical analysis.  We then went back to the lower pole calyx with the remaining tiny stone and attempted to laser this. Unfortunately the angle and location of the stone would not allow [place] to either laser or basket extract it.  At this point we elected to leave a ureteral stent and withdrew our instruments leaving a sensor wire in place.  We then advanced a 6Fr x 24 cm JJ ureteral stent with dangler with the assistance of a stent pusher under direct fluoroscopic guidance  without difficultly.  Sensor wire removal demonstrated satisfactory stent curl proximally in the renal pelvis and distally in the bladder. The bladder was emptied and all instrumentation was removed. The stent string was taped to the patient. The patient was woken up from anesthesia and taken to the recovery unit for routine postoperative care.   Post Op Plan:   1. Discharge once meets PACU criteria  2. She will remove her stent in at home Monday 12/12 3. Return to resident clinic in 6 weeks with RUS   Attestation:  Dr. Cain Sieve was present for the entire procedure.

## 2021-01-12 NOTE — Anesthesia Postprocedure Evaluation (Signed)
Anesthesia Post Note  Patient: Jamie Conley  Procedure(s) Performed: CYSTOSCOPY/URETEROSCOPY/HOLMIUM LASER/STENT PLACEMENT (Left)     Patient location during evaluation: PACU Anesthesia Type: General Level of consciousness: awake and alert Pain management: pain level controlled Vital Signs Assessment: post-procedure vital signs reviewed and stable Respiratory status: spontaneous breathing, nonlabored ventilation and respiratory function stable Cardiovascular status: stable and blood pressure returned to baseline Anesthetic complications: no   No notable events documented.  Last Vitals:  Vitals:   01/12/21 1415 01/12/21 1430  BP: 127/85 134/78  Pulse: 76 74  Resp: 12 15  Temp: 36.7 C   SpO2: 95% 100%    Last Pain:  Vitals:   01/12/21 1515  TempSrc:   PainSc: [place]

## 2021-01-12 NOTE — Discharge Instructions (Signed)

## 2021-01-13 ENCOUNTER — Encounter (HOSPITAL_COMMUNITY): Payer: Self-pay | Admitting: Urology

## 2021-01-21 ENCOUNTER — Emergency Department (HOSPITAL_BASED_OUTPATIENT_CLINIC_OR_DEPARTMENT_OTHER)
Admission: EM | Admit: 2021-01-21 | Discharge: 2021-01-21 | Disposition: A | Discharge status: Home / Self Care | Payer: Self-pay | Attending: Emergency Medicine | Admitting: Emergency Medicine

## 2021-01-21 ENCOUNTER — Encounter (HOSPITAL_BASED_OUTPATIENT_CLINIC_OR_DEPARTMENT_OTHER): Payer: Self-pay

## 2021-01-21 ENCOUNTER — Emergency Department (HOSPITAL_BASED_OUTPATIENT_CLINIC_OR_DEPARTMENT_OTHER): Payer: Self-pay | Admitting: Radiology

## 2021-01-21 ENCOUNTER — Other Ambulatory Visit: Payer: Self-pay

## 2021-01-21 DIAGNOSIS — I10 Essential (primary) hypertension: Secondary | ICD-10-CM | POA: Insufficient documentation

## 2021-01-21 DIAGNOSIS — Z79899 Other long term (current) drug therapy: Secondary | ICD-10-CM | POA: Insufficient documentation

## 2021-01-21 DIAGNOSIS — W19XXXA Unspecified fall, initial encounter: Secondary | ICD-10-CM | POA: Insufficient documentation

## 2021-01-21 DIAGNOSIS — E119 Type 2 diabetes mellitus without complications: Secondary | ICD-10-CM | POA: Insufficient documentation

## 2021-01-21 DIAGNOSIS — J45909 Unspecified asthma, uncomplicated: Secondary | ICD-10-CM | POA: Insufficient documentation

## 2021-01-21 DIAGNOSIS — M79632 Pain in left forearm: Secondary | ICD-10-CM | POA: Insufficient documentation

## 2021-01-21 DIAGNOSIS — S5012XA Contusion of left forearm, initial encounter: Secondary | ICD-10-CM

## 2021-01-21 DIAGNOSIS — Z7984 Long term (current) use of oral hypoglycemic drugs: Secondary | ICD-10-CM | POA: Insufficient documentation

## 2021-01-21 DIAGNOSIS — Z794 Long term (current) use of insulin: Secondary | ICD-10-CM | POA: Insufficient documentation

## 2021-01-21 DIAGNOSIS — Z859 Personal history of malignant neoplasm, unspecified: Secondary | ICD-10-CM | POA: Insufficient documentation

## 2021-01-21 MED ORDER — OXYCODONE-ACETAMINOPHEN 5-325 MG PO TABS
1.0000 | ORAL_TABLET | Freq: Once | ORAL | Status: AC
  Administered 2021-01-21: 1 via ORAL
  Filled 2021-01-21: qty 1

## 2021-01-21 NOTE — ED Triage Notes (Signed)
Pt presents with Left wrist and Left back pain from a fall 2 hours ago.

## 2021-01-21 NOTE — ED Notes (Signed)
Patient transported to X-ray 

## 2021-01-21 NOTE — ED Provider Notes (Signed)
[facility] DEPT Provider Note   CSN: [id number] Arrival date & time: 01/21/21  1901     History Chief Complaint  Patient presents with   Beryl Meager Tameya Kuznia is a 45 y.o. female.  45 year old female presents after mechanical fall just prior to arrival.  She fell onto her left forearm.  No LOC.  No head or neck or back injury.  Complains of sharp pain to her distal right forearm.  No numbness or tingling to her left hand.  No treatment use prior to arrival      Past Medical History:  Diagnosis Date   Anxiety    Asthma    Cancer ([place])    Depression    Diabetes mellitus without complication ([place])    Enlarged liver    Fibromyalgia    High cholesterol    History of kidney stones    Hypertension    Migraine     Patient Active Problem List   Diagnosis Date Noted   Status migrainosus    Postconcussive syndrome    Hepatic steatosis 12/25/2020   Aortic atherosclerosis ([place]) 12/25/2020   Depression    Hypertension    Migraine    Anxiety    Leukocytosis    Hyperlipidemia    Type 2 diabetes mellitus with hyperglycemia (HCC)    Hypokalemia    Syncopal episodes    Headache 12/24/2020    Past Surgical History:  Procedure Laterality Date   ABDOMINAL HYSTERECTOMY     ANTERIOR AND POSTERIOR REPAIR     colon polyectomy     CYSTOSCOPY/URETEROSCOPY/HOLMIUM LASER/STENT PLACEMENT Left 01/12/2021   Procedure: CYSTOSCOPY/URETEROSCOPY/HOLMIUM LASER/STENT PLACEMENT;  Surgeon: Vira Agar, MD;  Location: [facility];  Service: Urology;  Laterality: Left;   LEFT OOPHORECTOMY     LITHOTRIPSY     NASAL POLYP SURGERY     SPLENECTOMY     WRIST SURGERY Left      OB History   No obstetric history on file.     Family History  Problem Relation Age of Onset   Hypertension Mother    High Cholesterol Mother    Heart attack Father     Social History   Tobacco Use   Smoking status: Never   Smokeless tobacco: Never  Vaping Use   Vaping  Use: Never used  Substance Use Topics   Alcohol use: Never   Drug use: Yes    Comment: Delta 8 gummies    Home Medications Prior to Admission medications   Medication Sig Start Date End Date Taking? Authorizing Provider  ALPRAZolam Duanne Moron) 1 MG tablet Take 1 tablet (1 mg total) by mouth daily as needed for anxiety. 01/01/21   Amin, Jeanella Flattery, MD  atorvastatin (LIPITOR) 40 MG tablet Take 1 tablet (40 mg total) by mouth daily. 01/01/21   Amin, Jeanella Flattery, MD  blood glucose meter kit and supplies KIT Dispense based on patient and insurance preference. Use up to four times daily as directed. 01/01/21   Amin, Jeanella Flattery, MD  busPIRone (BUSPAR) 5 MG tablet Take 1 tablet (5 mg total) by mouth 3 (three) times daily. 01/01/21 01/31/21  Amin, Jeanella Flattery, MD  escitalopram (LEXAPRO) 20 MG tablet Take 1 tablet (20 mg total) by mouth daily. 01/02/21 02/01/21  Amin, Jeanella Flattery, MD  gabapentin (NEURONTIN) 300 MG capsule Take 1 capsule (300 mg total) by mouth 3 (three) times daily. 01/01/21 01/31/21  Amin, Jeanella Flattery, MD  HYDROmorphone (DILAUDID) 4 MG tablet Take 1  tablet (4 mg total) by mouth every 6 (six) hours as needed for severe pain. 01/03/21   Daleen Bo, MD  HYDROmorphone (DILAUDID) 4 MG tablet Take 1 tablet (4 mg total) by mouth every 6 (six) hours as needed for severe pain. 01/12/21   Vira Agar, MD  insulin isophane & regular human KwikPen (NOVOLIN 70/30 KWIKPEN) (70-30) 100 UNIT/ML KwikPen Inject 18 Units into the skin 2 (two) times daily. 01/01/21 01/31/21  Damita Lack, MD  Insulin Pen Needle (PEN NEEDLES 3/16") 31G X 5 MM MISC Use to inject insulin 4 (four) times daily -  before meals and at bedtime. 01/01/21   Amin, Jeanella Flattery, MD  ketorolac (TORADOL) 10 MG tablet Take 1 tablet (10 mg total) by mouth every 6 (six) hours as needed. 01/12/21   Vira Agar, MD  lisinopril (ZESTRIL) 20 MG tablet Take 1 tablet (20 mg total) by mouth daily. 01/01/21   Amin, Jeanella Flattery, MD  metFORMIN (GLUCOPHAGE) 1000 MG tablet Take 1 tablet (1,000 mg total) by mouth 2 (two) times daily with a meal. 01/01/21   Amin, Jeanella Flattery, MD  methocarbamol (ROBAXIN) 500 MG tablet Take 1 tablet (500 mg total) by mouth every 6 (six) hours as needed for muscle spasms. 01/01/21   Amin, Jeanella Flattery, MD  methocarbamol (ROBAXIN) 500 MG tablet Take 1 tablet (500 mg total) by mouth every 8 (eight) hours as needed for muscle spasms. 01/03/21   Daleen Bo, MD  oxybutynin (DITROPAN) 5 MG tablet Take 1 tablet (5 mg total) by mouth 3 (three) times daily as needed for bladder spasms. 01/12/21   Vira Agar, MD  phenazopyridine (PYRIDIUM) 200 MG tablet Take 1 tablet (200 mg total) by mouth 3 (three) times daily as needed for up to 10 days for pain. 01/12/21 01/22/21  Vira Agar, MD  senna-docusate (SENOKOT-S) 8.6-50 MG tablet Take 1 tablet by mouth at bedtime as needed for moderate constipation. 01/01/21   Amin, Jeanella Flattery, MD  tamsulosin (FLOMAX) 0.4 MG CAPS capsule Take 1 capsule (0.4 mg total) by mouth at bedtime. 01/12/21   Vira Agar, MD  traZODone (DESYREL) 50 MG tablet Take 1 tablet (50 mg total) by mouth at bedtime as needed for sleep. 01/01/21 01/31/21  Damita Lack, MD    Allergies    Patient has no known allergies.  Review of Systems   Review of Systems  All other systems reviewed and are negative.  Physical Exam Updated Vital Signs BP (!) 152/81 (BP Location: Right Arm)    Pulse 74    Temp 98.6 F (37 C)    Resp 16    SpO2 96%   Physical Exam Vitals and nursing note reviewed.  Constitutional:      General: She is not in acute distress.    Appearance: Normal appearance. She is well-developed. She is not toxic-appearing.  HENT:     Head: Normocephalic and atraumatic.  Eyes:     General: Lids are normal.     Conjunctiva/sclera: Conjunctivae normal.     Pupils: Pupils are equal, round, and reactive to light.  Neck:     Thyroid: No thyroid mass.      Trachea: No tracheal deviation.  Cardiovascular:     Rate and Rhythm: Normal rate and regular rhythm.     Heart sounds: Normal heart sounds. No murmur heard.   No gallop.  Pulmonary:     Effort: Pulmonary effort is normal. No respiratory distress.  Breath sounds: Normal breath sounds. No stridor. No decreased breath sounds, wheezing, rhonchi or rales.  Abdominal:     General: There is no distension.     Palpations: Abdomen is soft.     Tenderness: There is no abdominal tenderness. There is no rebound.  Musculoskeletal:        General: Normal range of motion.     Left forearm: Bony tenderness present.       Arms:     Cervical back: Normal range of motion and neck supple.  Skin:    General: Skin is warm and dry.     Findings: No abrasion or rash.  Neurological:     Mental Status: She is alert and oriented to person, place, and time. Mental status is at baseline.     GCS: GCS eye subscore is 4. GCS verbal subscore is 5. GCS motor subscore is 6.     Cranial Nerves: No cranial nerve deficit.     Sensory: No sensory deficit.     Motor: Motor function is intact.  Psychiatric:        Attention and Perception: Attention normal.        Speech: Speech normal.        Behavior: Behavior normal.    ED Results / Procedures / Treatments   Labs (all labs ordered are listed, but only abnormal results are displayed) Labs Reviewed - No data to display  EKG None  Radiology DG Hand Complete Left  Result Date: 01/21/2021 CLINICAL DATA:  Trauma, fall EXAM: LEFT HAND - COMPLETE 3+ VIEW COMPARISON:  None. FINDINGS: No recent fracture or dislocation is seen. There is deformity in the distal radius without break in the cortical margins. There are small round radiolucencies with sclerotic margins in the distal radius and ulna. IMPRESSION: No recent fracture or dislocation is seen in the left hand. Deformity in the distal radius suggests old healed fracture. There is evidence of previous  surgical internal fixation. Electronically Signed   By: Elmer Picker M.D.   On: 01/21/2021 19:35    Procedures Procedures   Medications Ordered in ED Medications  oxyCODONE-acetaminophen (PERCOCET/ROXICET) 5-325 MG per tablet 1 tablet (has no administration in time range)    ED Course  I have reviewed the triage vital signs and the nursing notes.  Pertinent labs & imaging results that were available during my care of the patient were reviewed by me and considered in my medical decision making (see chart for details).    MDM Rules/Calculators/A&P                         Patient medicated for pain here.  X-rays negative.  Will discharge    Final Clinical Impression(s) / ED Diagnoses Final diagnoses:  None    Rx / DC Orders ED Discharge Orders     None        Lacretia Leigh, MD 01/21/21 2055

## 2021-01-21 NOTE — Discharge Instructions (Signed)
X-rays of your left forearm and wrist were negative.

## 2021-01-28 ENCOUNTER — Telehealth: Payer: Self-pay | Admitting: *Deleted

## 2021-02-09 ENCOUNTER — Ambulatory Visit: Payer: Self-pay | Admitting: Psychiatry

## 2021-02-09 ENCOUNTER — Encounter: Payer: Self-pay | Admitting: Psychiatry
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
## 2021-02-19 ENCOUNTER — Other Ambulatory Visit: Payer: Self-pay

## 2021-02-19 ENCOUNTER — Ambulatory Visit: Payer: Self-pay | Attending: Nurse Practitioner | Admitting: Nurse Practitioner

## 2021-02-19 ENCOUNTER — Telehealth: Payer: Self-pay

## 2021-02-19 ENCOUNTER — Encounter: Payer: Self-pay | Admitting: Nurse Practitioner

## 2021-02-19 DIAGNOSIS — F418 Other specified anxiety disorders: Secondary | ICD-10-CM

## 2021-02-19 DIAGNOSIS — F419 Anxiety disorder, unspecified: Secondary | ICD-10-CM

## 2021-02-19 DIAGNOSIS — F5105 Insomnia due to other mental disorder: Secondary | ICD-10-CM

## 2021-02-19 DIAGNOSIS — Z794 Long term (current) use of insulin: Secondary | ICD-10-CM

## 2021-02-19 DIAGNOSIS — E1165 Type 2 diabetes mellitus with hyperglycemia: Secondary | ICD-10-CM

## 2021-02-19 DIAGNOSIS — F32A Depression, unspecified: Secondary | ICD-10-CM

## 2021-02-19 DIAGNOSIS — I10 Essential (primary) hypertension: Secondary | ICD-10-CM

## 2021-02-19 DIAGNOSIS — Z7689 Persons encountering health services in other specified circumstances: Secondary | ICD-10-CM

## 2021-02-19 MED ORDER — METHOCARBAMOL 500 MG PO TABS
500.0000 mg | ORAL_TABLET | Freq: Four times a day (QID) | ORAL | 0 refills | Status: DC | PRN
  Filled 2021-02-19: qty 10, 3d supply, fill #0

## 2021-02-19 MED ORDER — TRULICITY 0.75 MG/0.5ML ~~LOC~~ SOAJ
0.7500 mg | SUBCUTANEOUS | 2 refills | Status: DC
  Filled 2021-02-19 (×2): qty 2, 28d supply, fill #0
  Filled 2021-04-01: qty 2, 28d supply, fill #1
  Filled 2021-04-28: qty 2, 28d supply, fill #2

## 2021-02-19 MED ORDER — METFORMIN HCL 1000 MG PO TABS
1000.0000 mg | ORAL_TABLET | Freq: Two times a day (BID) | ORAL | 4 refills | Status: DC
  Filled 2021-02-19 – 2021-04-01 (×2): qty 60, 30d supply, fill #0
  Filled 2021-04-28: qty 60, 30d supply, fill #1
  Filled 2021-05-31: qty 60, 30d supply, fill #2
  Filled 2021-06-30: qty 60, 30d supply, fill #3
  Filled 2021-08-02: qty 60, 30d supply, fill #4

## 2021-02-19 MED ORDER — TRAZODONE HCL 150 MG PO TABS
150.0000 mg | ORAL_TABLET | Freq: Every evening | ORAL | 3 refills | Status: DC | PRN
  Filled 2021-02-19: qty 30, 30d supply, fill #0
  Filled 2021-04-01: qty 30, 30d supply, fill #1
  Filled 2021-04-28: qty 30, 30d supply, fill #2
  Filled 2021-05-31: qty 30, 30d supply, fill #3

## 2021-02-19 MED ORDER — INSULIN DETEMIR 100 UNIT/ML FLEXPEN
20.0000 [IU] | PEN_INJECTOR | Freq: Every day | SUBCUTANEOUS | 3 refills | Status: DC
  Filled 2021-02-19: qty 6, 30d supply, fill #0
  Filled 2021-04-01: qty 6, 30d supply, fill #1

## 2021-02-19 MED ORDER — NOVOLOG FLEXPEN 100 UNIT/ML ~~LOC~~ SOPN
PEN_INJECTOR | SUBCUTANEOUS | 11 refills | Status: DC
  Filled 2021-02-19: qty 3, 25d supply, fill #0
  Filled 2021-04-01: qty 15, 40d supply, fill #0

## 2021-02-19 MED ORDER — LISINOPRIL 20 MG PO TABS
20.0000 mg | ORAL_TABLET | Freq: Every day | ORAL | 3 refills | Status: DC
  Filled 2021-02-19 – 2021-04-01 (×2): qty 30, 30d supply, fill #0
  Filled 2021-04-28: qty 30, 30d supply, fill #1
  Filled 2021-06-30: qty 30, 30d supply, fill #2
  Filled 2021-08-02: qty 30, 30d supply, fill #3

## 2021-02-19 MED ORDER — GABAPENTIN 300 MG PO CAPS
300.0000 mg | ORAL_CAPSULE | Freq: Three times a day (TID) | ORAL | 3 refills | Status: DC
  Filled 2021-02-19: qty 90, 30d supply, fill #0
  Filled 2021-04-01: qty 90, 30d supply, fill #1
  Filled 2021-04-28: qty 90, 30d supply, fill #2
  Filled 2021-06-30: qty 90, 30d supply, fill #3

## 2021-02-19 MED ORDER — ESCITALOPRAM OXALATE 20 MG PO TABS
20.0000 mg | ORAL_TABLET | Freq: Every day | ORAL | 3 refills | Status: DC
  Filled 2021-02-19 – 2021-04-01 (×2): qty 30, 30d supply, fill #0
  Filled 2021-04-28: qty 30, 30d supply, fill #1
  Filled 2021-05-31: qty 30, 30d supply, fill #2
  Filled 2021-06-30: qty 30, 30d supply, fill #3

## 2021-02-19 MED ORDER — INSULIN PEN NEEDLE 31G X 5 MM MISC
100.0000 | Freq: Two times a day (BID) | 6 refills | Status: AC
  Filled 2021-02-19 – 2021-08-26 (×2): qty 100, 30d supply, fill #0
  Filled 2021-09-20: qty 100, 30d supply, fill #1

## 2021-02-19 MED ORDER — BUSPIRONE HCL 15 MG PO TABS
15.0000 mg | ORAL_TABLET | Freq: Two times a day (BID) | ORAL | 3 refills | Status: DC
  Filled 2021-02-19: qty 60, 30d supply, fill #0
  Filled 2021-04-01: qty 60, 30d supply, fill #1
  Filled 2021-04-28: qty 60, 30d supply, fill #2
  Filled 2021-05-31: qty 60, 30d supply, fill #3

## 2021-02-19 MED ORDER — METHOCARBAMOL 500 MG PO TABS
500.0000 mg | ORAL_TABLET | Freq: Three times a day (TID) | ORAL | 1 refills | Status: DC | PRN
  Filled 2021-02-19: qty 60, 20d supply, fill #0

## 2021-02-19 NOTE — Telephone Encounter (Signed)
PA approved for Trulicity until 50/38/8828

## 2021-02-19 NOTE — Progress Notes (Signed)
Virtual Visit Note Due to national recommendations of social distancing due to [place] 19, virtual visit is felt to be most appropriate for this patient at this time.  I discussed the limitations, risks, security and privacy concerns of performing an evaluation and management service by video and the availability of in person appointments. I also discussed with the patient that there may be a patient responsible charge related to this service. The patient expressed understanding and agreed to proceed.    I connected with [name] on 02/21/21  at   8:50 AM EST  EDT by VIDEO and verified that I am speaking with the correct person using two identifiers.   Location of Patient: Private Residence   Location of Provider: [facility] [employer] [facility]    Persons participating in VIRTUAL visit: Geryl Rankins FNP-BC [place]    History of Present Illness: VIRTUAL visit for: Establish care She has a past medical history of Anxiety, Asthma, Cancer ([place]), Depression, Diabetes mellitus without complication ([place]), Enlarged liver, Fibromyalgia, High cholesterol, History of kidney stones, Hypertension, and Migraine.   Moved to [facility] in October 2022. Needs refill of her anxiety and Depression medications. I did instruct her that I do not prescribe xanax. She is agreeable to starting buspar. Requesting to increase trazodone due to current dose ineffectiveness. She does not endorse any thoughts of self harm  HTN Taking lisinopril 20 mg. Blood pressure not well controlled.  BP Readings from Last 3 Encounters:  01/21/21 (!) 152/81  01/12/21 134/78  01/04/21 102/70    Patient has been counseled on age-appropriate routine health concerns for screening and prevention. These are reviewed and up-to-date. Referrals have been placed accordingly. Immunizations are up-to-date or declined.     Mammogram: overdue. Will need to pick up BCCCP application PAP: Hysterectomy  2019 COLONOSCOPY: Dx'd with colon cancer when she was 36. Last colonoscopy 2018. Normal.   DM Poorly controlled. Will start her on Trulicity 3.53 mg weekly. Will dc novolin 70/30. She will take metformin 1000 mg BID,  levemir 20 units daily and novolog SSI.  Lab Results  Component Value Date   HGBA1C 11.4 (H) 12/25/2020    Past Medical History:  Diagnosis Date   Anxiety    Asthma    Cancer ([place])    Depression    Diabetes mellitus without complication ([place])    Enlarged liver    Fibromyalgia    High cholesterol    History of kidney stones    Hypertension    Migraine     Past Surgical History:  Procedure Laterality Date   ABDOMINAL HYSTERECTOMY     ANTERIOR AND POSTERIOR REPAIR     colon polyectomy     CYSTOSCOPY/URETEROSCOPY/HOLMIUM LASER/STENT PLACEMENT Left 01/12/2021   Procedure: CYSTOSCOPY/URETEROSCOPY/HOLMIUM LASER/STENT PLACEMENT;  Surgeon: Vira Agar, MD;  Location: [facility];  Service: Urology;  Laterality: Left;   LEFT OOPHORECTOMY     LITHOTRIPSY     NASAL POLYP SURGERY     SPLENECTOMY     WRIST SURGERY Left     Family History  Problem Relation Age of Onset   Hypertension Mother    High Cholesterol Mother    Heart attack Father     Social History   Socioeconomic History   Marital status: Single    Spouse name: Not on file   Number of children: Not on file   Years of education: Not on file   Highest education level: Not on file  Occupational History  Not on file  Tobacco Use   Smoking status: Never   Smokeless tobacco: Never  Vaping Use   Vaping Use: Never used  Substance and Sexual Activity   Alcohol use: Never   Drug use: Yes    Comment: Delta 8 gummies   Sexual activity: Not on file  Other Topics Concern   Not on file  Social History Narrative   Not on file   Social Determinants of Health   Financial Resource Strain: Not on file  Food Insecurity: Not on file  Transportation Needs: Not on file  Physical Activity: Not on file   Stress: Not on file  Social Connections: Not on file     Observations/Objective: Awake, alert and oriented x 3   Review of Systems  Constitutional:  Negative for fever, malaise/fatigue and weight loss.  HENT: Negative.  Negative for nosebleeds.   Eyes: Negative.  Negative for blurred vision, double vision and photophobia.  Respiratory: Negative.  Negative for cough and shortness of breath.   Cardiovascular: Negative.  Negative for chest pain, palpitations and leg swelling.  Gastrointestinal: Negative.  Negative for heartburn, nausea and vomiting.  Musculoskeletal:  Positive for myalgias.  Neurological: Negative.  Negative for dizziness, focal weakness, seizures and headaches.  Psychiatric/Behavioral:  Positive for depression. Negative for suicidal ideas. The patient is nervous/anxious and has insomnia.    Assessment and Plan: Diagnoses and all orders for this visit:  Encounter to establish care  Anxiety and depression -     escitalopram (LEXAPRO) 20 MG tablet; Take 1 tablet (20 mg total) by mouth daily. -     busPIRone (BUSPAR) 15 MG tablet; Take 1 tablet (15 mg total) by mouth 2 (two) times daily.  Type 2 diabetes mellitus with hyperglycemia, with long-term current use of insulin (HCC) -     gabapentin (NEURONTIN) 300 MG capsule; Take 1 capsule (300 mg total) by mouth 3 (three) times daily. -     metFORMIN (GLUCOPHAGE) 1000 MG tablet; Take 1 tablet (1,000 mg total) by mouth 2 (two) times daily with a meal. -     Insulin Pen Needle 31G X 5 MM MISC; 100 each by Does not apply route 2 (two) times daily. -     Dulaglutide (TRULICITY) 4.09 WJ/1.9JY SOPN; Inject 0.75 mg into the skin once a week. -     insulin detemir (LEVEMIR) 100 UNIT/ML FlexPen; Inject 20 Units into the skin at bedtime. -     insulin aspart (NOVOLOG FLEXPEN) 100 UNIT/ML FlexPen; For blood sugars 0-150 give 0 units of insulin, 151-200 give 2 units of insulin, 201-250 give 4 units, 251-300 give 6 units, 301-350 give 8  units, 351-400 give 10 units,> 400 give 12 units and call M.D. Discussed hypoglycemia protocol.  Primary hypertension -     lisinopril (ZESTRIL) 20 MG tablet; Take 1 tablet (20 mg total) by mouth daily.  Insomnia secondary to depression with anxiety -     traZODone (DESYREL) 150 MG tablet; Take 1 tablet (150 mg total) by mouth at bedtime as needed for sleep.      Follow Up Instructions Return in about 2 months (around 04/19/2021) for DM.     I discussed the assessment and treatment plan with the patient. The patient was provided an opportunity to ask questions and all were answered. The patient agreed with the plan and demonstrated an understanding of the instructions.   The patient was advised to call back or seek an in-person evaluation if the symptoms worsen or  if the condition fails to improve as anticipated.  I provided 20 minutes of face-to-face time during this encounter including median intraservice time, reviewing previous notes, labs, imaging, medications and explaining diagnosis and management.  Gildardo Pounds, FNP-BC

## 2021-02-21 ENCOUNTER — Encounter: Payer: Self-pay | Admitting: Nurse Practitioner

## 2021-03-08 ENCOUNTER — Encounter: Payer: Self-pay | Admitting: Nurse Practitioner

## 2021-03-08 ENCOUNTER — Other Ambulatory Visit: Payer: Self-pay | Admitting: Nurse Practitioner

## 2021-03-08 DIAGNOSIS — M791 Myalgia, unspecified site: Secondary | ICD-10-CM

## 2021-03-08 MED ORDER — TIZANIDINE HCL 4 MG PO TABS: 4.0000 mg | ORAL_TABLET | Freq: Four times a day (QID) | ORAL | 0 refills | Status: DC | PRN

## 2021-03-15 ENCOUNTER — Encounter: Payer: Self-pay | Admitting: Nurse Practitioner

## 2021-03-17 ENCOUNTER — Other Ambulatory Visit: Payer: Self-pay

## 2021-03-17 ENCOUNTER — Encounter (HOSPITAL_COMMUNITY): Payer: Self-pay | Admitting: Emergency Medicine

## 2021-03-17 ENCOUNTER — Emergency Department (HOSPITAL_COMMUNITY): Payer: 59

## 2021-03-17 ENCOUNTER — Emergency Department (HOSPITAL_COMMUNITY)
Admission: EM | Admit: 2021-03-17 | Discharge: 2021-03-17 | Disposition: A | Discharge status: Home / Self Care | Payer: 59 | Attending: Emergency Medicine | Admitting: Emergency Medicine

## 2021-03-17 DIAGNOSIS — R42 Dizziness and giddiness: Secondary | ICD-10-CM | POA: Diagnosis not present

## 2021-03-17 DIAGNOSIS — D72829 Elevated white blood cell count, unspecified: Secondary | ICD-10-CM | POA: Diagnosis not present

## 2021-03-17 DIAGNOSIS — S39012A Strain of muscle, fascia and tendon of lower back, initial encounter: Secondary | ICD-10-CM | POA: Insufficient documentation

## 2021-03-17 DIAGNOSIS — S3992XA Unspecified injury of lower back, initial encounter: Secondary | ICD-10-CM | POA: Diagnosis present

## 2021-03-17 DIAGNOSIS — X500XXA Overexertion from strenuous movement or load, initial encounter: Secondary | ICD-10-CM | POA: Diagnosis not present

## 2021-03-17 LAB — URINALYSIS, ROUTINE W REFLEX MICROSCOPIC
Bilirubin Urine: NEGATIVE
Glucose, UA: NEGATIVE mg/dL
Hgb urine dipstick: NEGATIVE
Ketones, ur: NEGATIVE mg/dL
Leukocytes,Ua: NEGATIVE
Nitrite: NEGATIVE
Protein, ur: NEGATIVE mg/dL
Specific Gravity, Urine: 1.011 (ref 1.005–1.030)
pH: 6 (ref 5.0–8.0)

## 2021-03-17 LAB — CBC
HCT: 38.5 % (ref 36.0–46.0)
Hemoglobin: 12.8 g/dL (ref 12.0–15.0)
MCH: 31.2 pg (ref 26.0–34.0)
MCHC: 33.2 g/dL (ref 30.0–36.0)
MCV: 93.9 fL (ref 80.0–100.0)
Platelets: 543 10*3/uL — ABNORMAL HIGH (ref 150–400)
RBC: 4.1 MIL/uL (ref 3.87–5.11)
RDW: 14.6 % (ref 11.5–15.5)
WBC: 19.6 10*3/uL — ABNORMAL HIGH (ref 4.0–10.5)
nRBC: 0 % (ref 0.0–0.2)

## 2021-03-17 LAB — BASIC METABOLIC PANEL
Anion gap: 8 (ref 5–15)
BUN: 24 mg/dL — ABNORMAL HIGH (ref 6–20)
CO2: 21 mmol/L — ABNORMAL LOW (ref 22–32)
Calcium: 8.7 mg/dL — ABNORMAL LOW (ref 8.9–10.3)
Chloride: 110 mmol/L (ref 98–111)
Creatinine, Ser: 0.97 mg/dL (ref 0.44–1.00)
GFR, Estimated: >60 mL/min (ref >60)
Glucose, Bld: 125 mg/dL — ABNORMAL HIGH (ref 70–99)
Potassium: 3.4 mmol/L — ABNORMAL LOW (ref 3.5–5.1)
Sodium: 139 mmol/L (ref 135–145)

## 2021-03-17 MED ORDER — MORPHINE SULFATE (PF) 4 MG/ML IV SOLN
4.0000 mg | Freq: Once | INTRAVENOUS | Status: AC
  Administered 2021-03-17: 4 mg via INTRAVENOUS
  Filled 2021-03-17: qty 1

## 2021-03-17 MED ORDER — ETODOLAC 300 MG PO CAPS: 300.0000 mg | ORAL_CAPSULE | Freq: Three times a day (TID) | ORAL | 0 refills | Status: DC

## 2021-03-17 MED ORDER — SODIUM CHLORIDE 0.9 % IV SOLN
1000.0000 mL | INTRAVENOUS | Status: DC
  Administered 2021-03-17: 1000 mL via INTRAVENOUS

## 2021-03-17 MED ORDER — CARISOPRODOL 350 MG PO TABS: 350.0000 mg | ORAL_TABLET | Freq: Three times a day (TID) | ORAL | 0 refills | Status: DC

## 2021-03-17 MED ORDER — DIPHENHYDRAMINE HCL 50 MG/ML IJ SOLN
INTRAMUSCULAR | Status: AC
  Administered 2021-03-17: 25 mg via INTRAVENOUS
  Filled 2021-03-17: qty 1

## 2021-03-17 MED ORDER — HYDROCODONE-ACETAMINOPHEN 5-325 MG PO TABS: 1.0000 | ORAL_TABLET | Freq: Four times a day (QID) | ORAL | 0 refills | Status: DC | PRN

## 2021-03-17 MED ORDER — SODIUM CHLORIDE 0.9 % IV BOLUS (SEPSIS)
1000.0000 mL | Freq: Once | INTRAVENOUS | Status: AC
  Administered 2021-03-17: 1000 mL via INTRAVENOUS

## 2021-03-17 MED ORDER — DIPHENHYDRAMINE HCL 50 MG/ML IJ SOLN: 25.0000 mg | Freq: Once | INTRAMUSCULAR | Status: AC

## 2021-03-17 NOTE — ED Provider Notes (Signed)
[facility] DEPT Provider Note   CSN: [id number] Arrival date & time: 03/17/21  1921     History  Chief Complaint  Patient presents with   Back Pain    Jamie Conley is a 46 y.o. female.   Back Pain Associated symptoms: no fever    Patient states she started having issues with back pain a few days ago.  Patient does have prior back problems and she was lifting heavy object on her own.  Patient initially felt fine but when she put it down she started experiencing significant pain in the lower back.  She is not having any numbness or weakness in her legs.  Patient states its been hard for her to get around for the last several days.  It is painful to walk.  She was driving home from [place] back to [place] and on the ride back she started to feel lightheaded and weak.  She denies any headache.  No fevers or chills.  No abdominal pain.  No cough or shortness of breath.  When the patient finally got home she called EMS and was brought to the ED  Home Medications Prior to Admission medications   Medication Sig Start Date End Date Taking? Authorizing Provider  carisoprodol (SOMA) 350 MG tablet Take 1 tablet (350 mg total) by mouth 3 (three) times daily. 03/17/21  Yes Dorie Rank, MD  etodolac (LODINE) 300 MG capsule Take 1 capsule (300 mg total) by mouth every 8 (eight) hours. 03/17/21  Yes Dorie Rank, MD  HYDROcodone-acetaminophen (NORCO/VICODIN) 5-325 MG tablet Take 1 tablet by mouth every 6 (six) hours as needed. 03/17/21  Yes Dorie Rank, MD  atorvastatin (LIPITOR) 40 MG tablet Take 1 tablet (40 mg total) by mouth daily. 01/01/21   Amin, Jeanella Flattery, MD  blood glucose meter kit and supplies KIT Dispense based on patient and insurance preference. Use up to four times daily as directed. 01/01/21   Amin, Jeanella Flattery, MD  busPIRone (BUSPAR) 15 MG tablet Take 1 tablet (15 mg total) by mouth 2 (two) times daily. 02/19/21   Gildardo Pounds, NP   Dulaglutide (TRULICITY) 5.18 AC/1.6SA SOPN Inject 0.75 mg into the skin once a week. 02/19/21 03/21/21  Gildardo Pounds, NP  escitalopram (LEXAPRO) 20 MG tablet Take 1 tablet (20 mg total) by mouth daily. 02/19/21 03/21/21  Gildardo Pounds, NP  gabapentin (NEURONTIN) 300 MG capsule Take 1 capsule (300 mg total) by mouth 3 (three) times daily. 02/19/21 03/21/21  Gildardo Pounds, NP  insulin aspart (NOVOLOG FLEXPEN) 100 UNIT/ML FlexPen For blood sugars 0-150 give 0 units of insulin, 151-200 give 2 units of insulin, 201-250 give 4 units, 251-300 give 6 units, 301-350 give 8 units, 351-400 give 10 units,> 400 give 12 units and call M.D. Discussed hypoglycemia protocol. 02/19/21   Gildardo Pounds, NP  insulin detemir (LEVEMIR) 100 UNIT/ML FlexPen Inject 20 Units into the skin at bedtime. 02/19/21 03/21/21  Gildardo Pounds, NP  Insulin Pen Needle 31G X 5 MM MISC 100 each by Does not apply route 2 (two) times daily. 02/19/21   Gildardo Pounds, NP  lisinopril (ZESTRIL) 20 MG tablet Take 1 tablet (20 mg total) by mouth daily. 02/19/21   Gildardo Pounds, NP  metFORMIN (GLUCOPHAGE) 1000 MG tablet Take 1 tablet (1,000 mg total) by mouth 2 (two) times daily with a meal. 02/19/21   Gildardo Pounds, NP  tiZANidine (ZANAFLEX) 4 MG tablet Take 1 tablet (  4 mg total) by mouth every 6 (six) hours as needed for muscle spasms. 03/08/21   Gildardo Pounds, NP  traZODone (DESYREL) 150 MG tablet Take 1 tablet (150 mg total) by mouth at bedtime as needed for sleep. 02/19/21 03/21/21  Gildardo Pounds, NP      Allergies    Patient has no known allergies.    Review of Systems   Review of Systems  Constitutional:  Negative for fever.  Musculoskeletal:  Positive for back pain.   Physical Exam Updated Vital Signs BP 105/68    Pulse 87    Temp 97.9 F (36.6 C) (Oral)    Resp (!) 21    SpO2 98%  Physical Exam Vitals and nursing note reviewed.  Constitutional:      Appearance: She is well-developed. She is not diaphoretic.   HENT:     Head: Normocephalic and atraumatic.     Right Ear: External ear normal.     Left Ear: External ear normal.  Eyes:     General: No scleral icterus.       Right eye: No discharge.        Left eye: No discharge.     Conjunctiva/sclera: Conjunctivae normal.  Neck:     Trachea: No tracheal deviation.  Cardiovascular:     Rate and Rhythm: Normal rate and regular rhythm.  Pulmonary:     Effort: Pulmonary effort is normal. No respiratory distress.     Breath sounds: Normal breath sounds. No stridor. No wheezing or rales.  Abdominal:     General: Bowel sounds are normal. There is no distension.     Palpations: Abdomen is soft.     Tenderness: There is no abdominal tenderness. There is no guarding or rebound.  Musculoskeletal:        General: Tenderness present. No deformity.     Cervical back: Neck supple.     Comments: Tenderness palpation paraspinal lumbar spine  Skin:    General: Skin is warm and dry.     Findings: No rash.  Neurological:     General: No focal deficit present.     Mental Status: She is alert.     Cranial Nerves: No cranial nerve deficit (no facial droop, extraocular movements intact, no slurred speech).     Sensory: No sensory deficit.     Motor: No abnormal muscle tone or seizure activity.     Coordination: Coordination normal.     Comments: 5 out of strength bilateral upper extremities and lower extremities, sensation intact throughout, no facial droop  Psychiatric:        Mood and Affect: Mood normal.    ED Results / Procedures / Treatments   Labs (all labs ordered are listed, but only abnormal results are displayed) Labs Reviewed  CBC - Abnormal; Notable for the following components:      Result Value   WBC 19.6 (*)    Platelets 543 (*)    All other components within normal limits  BASIC METABOLIC PANEL - Abnormal; Notable for the following components:   Potassium 3.4 (*)    CO2 21 (*)    Glucose, Bld 125 (*)    BUN 24 (*)    Calcium 8.7  (*)    All other components within normal limits  URINALYSIS, ROUTINE W REFLEX MICROSCOPIC - Abnormal; Notable for the following components:   Color, Urine STRAW (*)    All other components within normal limits    EKG EKG Interpretation  Date/Time:  Wednesday March 17 2021 19:50:35 EST Ventricular Rate:  92 PR Interval:  150 QRS Duration: 107 QT Interval:  368 QTC Calculation: 456 R Axis:   42 Text Interpretation: Sinus rhythm Confirmed by Dorie Rank [phone]) on 03/17/2021 9:16:17 PM  Radiology DG Lumbar Spine Complete  Result Date: 03/17/2021 CLINICAL DATA:  Back injury lifting heavy object 3 days ago. EXAM: LUMBAR SPINE - COMPLETE 4+ VIEW COMPARISON:  None. FINDINGS: Degenerative facet disease at L4-5 and L5-S1. Disc spaces maintained. Normal alignment. No fracture. SI joints symmetric and unremarkable. IMPRESSION: Degenerative facet disease in the lower lumbar spine. No acute bony abnormality. Electronically Signed   By: Rolm Baptise M.D.   On: 03/17/2021 20:24    Procedures Procedures    Medications Ordered in ED Medications  sodium chloride 0.9 % bolus 1,000 mL (0 mLs Intravenous Stopped 03/17/21 2200)    Followed by  0.9 %  sodium chloride infusion (1,000 mLs Intravenous New Bag/Given 03/17/21 2045)  morphine (PF) 4 MG/ML injection 4 mg (4 mg Intravenous Given 03/17/21 2045)  diphenhydrAMINE (BENADRYL) injection 25 mg (25 mg Intravenous Given 03/17/21 2112)  morphine (PF) 4 MG/ML injection 4 mg (4 mg Intravenous Given 03/17/21 2221)    ED Course/ Medical Decision Making/ A&P Clinical Course as of 03/17/21 2258  Wed Mar 17, 2021  2132 CBC(!) White blood cell count elevated but unchanged compared to previous values [JK]  2152 Prior CT scans reviewed.  Patient had a renal stone but no ureteral stones [JK]  7416 Basic metabolic panel(!) Normal [JK]  2246 Urinalysis without signs of infection [JK]    Clinical Course User Index [JK] Dorie Rank, MD                            Medical Decision Making Amount and/or Complexity of Data Reviewed Labs: ordered. Decision-making details documented in ED Course. Radiology: ordered.  Risk Prescription drug management.   Back pain Patient has history of recurrent back pain.  Suspect lumbar strain.  Patient does not have any neurologic dysfunction.  Normal strength and sensation on my exam.  Patient was treated with IV narcotic pain medications.  Will discharge home with pain medication.  Lightheadedness. Patient does not have any signs of dehydration or electrolyte abnormalities.  She does have leukocytosis but this is chronic.  Doubt acute infection.  Evaluation and diagnostic testing in the emergency department does not suggest an emergent condition requiring admission or immediate intervention beyond what has been performed at this time.  The patient is safe for discharge and has been instructed to return immediately for worsening symptoms, change in symptoms or any other concerns.         Final Clinical Impression(s) / ED Diagnoses Final diagnoses:  Strain of lumbar region, initial encounter    Rx / DC Orders ED Discharge Orders          Ordered    HYDROcodone-acetaminophen (NORCO/VICODIN) 5-325 MG tablet  Every 6 hours PRN        03/17/21 2257    etodolac (LODINE) 300 MG capsule  Every 8 hours       Note to Pharmacy: As needed for pain   03/17/21 2257    carisoprodol (SOMA) 350 MG tablet  3 times daily        03/17/21 2257              Dorie Rank, MD 03/17/21 2301

## 2021-03-17 NOTE — Discharge Instructions (Signed)
Take the medications to help with your pain and discomfort.  You can also try over-the-counter lidocaine patches such as Salonpas.  Follow-up with a spine doctor for further evaluation.

## 2021-03-17 NOTE — ED Triage Notes (Signed)
Patient BIB EMS from home c/o back pain started 3-4 days ago. Per report back pain unable to tolerate tonight. Pt denies N/V. PT a/xo4.  BP 118/80 HR 89  RR 20 O2sat 98% on RA

## 2021-03-17 NOTE — ED Notes (Signed)
Pt ambulatory to restroom w/out assistance. Pt provided with specimen cup and instructed to attempt to provide urine sample.

## 2021-03-23 ENCOUNTER — Telehealth: Payer: Self-pay | Admitting: Nurse Practitioner

## 2021-03-23 ENCOUNTER — Telehealth: Payer: 59 | Admitting: Nurse Practitioner

## 2021-03-23 NOTE — Telephone Encounter (Signed)
NO answer LVM 

## 2021-04-01 ENCOUNTER — Other Ambulatory Visit: Payer: Self-pay

## 2021-04-01 ENCOUNTER — Other Ambulatory Visit (HOSPITAL_COMMUNITY): Payer: Self-pay

## 2021-04-01 MED ORDER — INSULIN DETEMIR 100 UNIT/ML FLEXPEN
PEN_INJECTOR | SUBCUTANEOUS | 2 refills | Status: DC
  Filled 2021-04-01: qty 6, 30d supply, fill #0
  Filled 2021-06-30: qty 6, 30d supply, fill #1
  Filled 2021-08-02: qty 6, 30d supply, fill #2

## 2021-04-19 ENCOUNTER — Ambulatory Visit: Payer: Self-pay | Admitting: Nurse Practitioner

## 2021-04-20 ENCOUNTER — Ambulatory Visit: Payer: 59 | Attending: Nurse Practitioner | Admitting: Nurse Practitioner

## 2021-04-20 ENCOUNTER — Other Ambulatory Visit: Payer: Self-pay

## 2021-04-20 ENCOUNTER — Telehealth: Payer: Self-pay | Admitting: Nurse Practitioner

## 2021-04-20 NOTE — Telephone Encounter (Signed)
LVM. NO answer.  ?

## 2021-04-28 ENCOUNTER — Other Ambulatory Visit: Payer: Self-pay

## 2021-04-29 ENCOUNTER — Other Ambulatory Visit: Payer: Self-pay

## 2021-05-24 ENCOUNTER — Emergency Department (HOSPITAL_BASED_OUTPATIENT_CLINIC_OR_DEPARTMENT_OTHER): Payer: 59

## 2021-05-24 ENCOUNTER — Other Ambulatory Visit: Payer: Self-pay

## 2021-05-24 ENCOUNTER — Encounter (HOSPITAL_BASED_OUTPATIENT_CLINIC_OR_DEPARTMENT_OTHER): Payer: Self-pay

## 2021-05-24 ENCOUNTER — Emergency Department (HOSPITAL_BASED_OUTPATIENT_CLINIC_OR_DEPARTMENT_OTHER)
Admission: EM | Admit: 2021-05-24 | Discharge: 2021-05-25 | Disposition: A | Discharge status: Home / Self Care | Payer: 59 | Attending: Emergency Medicine | Admitting: Emergency Medicine

## 2021-05-24 DIAGNOSIS — D72829 Elevated white blood cell count, unspecified: Secondary | ICD-10-CM | POA: Insufficient documentation

## 2021-05-24 DIAGNOSIS — K429 Umbilical hernia without obstruction or gangrene: Secondary | ICD-10-CM | POA: Diagnosis not present

## 2021-05-24 DIAGNOSIS — N200 Calculus of kidney: Secondary | ICD-10-CM | POA: Diagnosis not present

## 2021-05-24 DIAGNOSIS — R109 Unspecified abdominal pain: Secondary | ICD-10-CM | POA: Diagnosis present

## 2021-05-24 LAB — URINALYSIS, MICROSCOPIC (REFLEX)
Bacteria, UA: NONE SEEN
RBC / HPF: >50 RBC/hpf (ref 0–5)

## 2021-05-24 LAB — URINALYSIS, ROUTINE W REFLEX MICROSCOPIC

## 2021-05-24 LAB — BASIC METABOLIC PANEL
Anion gap: 13 (ref 5–15)
BUN: 23 mg/dL — ABNORMAL HIGH (ref 6–20)
CO2: 18 mmol/L — ABNORMAL LOW (ref 22–32)
Calcium: 9.7 mg/dL (ref 8.9–10.3)
Chloride: 108 mmol/L (ref 98–111)
Creatinine, Ser: 0.63 mg/dL (ref 0.44–1.00)
GFR, Estimated: >60 mL/min (ref >60)
Glucose, Bld: 143 mg/dL — ABNORMAL HIGH (ref 70–99)
Potassium: 4.3 mmol/L (ref 3.5–5.1)
Sodium: 139 mmol/L (ref 135–145)

## 2021-05-24 LAB — CBC
HCT: 39.2 % (ref 36.0–46.0)
Hemoglobin: 13 g/dL (ref 12.0–15.0)
MCH: 30.2 pg (ref 26.0–34.0)
MCHC: 33.2 g/dL (ref 30.0–36.0)
MCV: 91 fL (ref 80.0–100.0)
Platelets: 549 10*3/uL — ABNORMAL HIGH (ref 150–400)
RBC: 4.31 MIL/uL (ref 3.87–5.11)
RDW: 15.4 % (ref 11.5–15.5)
WBC: 16.4 10*3/uL — ABNORMAL HIGH (ref 4.0–10.5)
nRBC: 0 % (ref 0.0–0.2)

## 2021-05-24 MED ORDER — SODIUM CHLORIDE 0.9 % IV BOLUS
1000.0000 mL | Freq: Once | INTRAVENOUS | Status: AC
  Administered 2021-05-24: 1000 mL via INTRAVENOUS

## 2021-05-24 MED ORDER — ONDANSETRON HCL 4 MG/2ML IJ SOLN
4.0000 mg | Freq: Once | INTRAMUSCULAR | Status: AC
  Administered 2021-05-24: 4 mg via INTRAVENOUS
  Filled 2021-05-24: qty 2

## 2021-05-24 MED ORDER — KETOROLAC TROMETHAMINE 15 MG/ML IJ SOLN
15.0000 mg | Freq: Once | INTRAMUSCULAR | Status: AC
  Administered 2021-05-24: 15 mg via INTRAVENOUS
  Filled 2021-05-24: qty 1

## 2021-05-24 MED ORDER — HYDROMORPHONE HCL 1 MG/ML IJ SOLN
1.0000 mg | Freq: Once | INTRAMUSCULAR | Status: AC
  Administered 2021-05-24: 1 mg via INTRAVENOUS
  Filled 2021-05-24: qty 1

## 2021-05-24 NOTE — ED Provider Notes (Signed)
?[facility] ?Provider Note ? ? ?CSN: [id number] ?Arrival date & time: 05/24/21  1629 ? ?  ? ?History ? ?Chief Complaint  ?Patient presents with  ? Flank Pain  ? ? ?Jamie Conley is a 46 y.o. female. ? ?Patient with history of nephrolithiasis presents today with complaints of left sided flank pain and hematuria. States that same has been ongoing for the past 3-4 days and has been worsening since. States that pain radiates into her groin and the midline of her back. States that she has an extensive history of kidney stones has had greater than 25 lithotripsies in the past for same. She states that she 'has a small urethra and kidney stones never pass without surgery.' States that she only recently moved here from [place] and has yet to establish care with urology.  Does state that she had a lithotripsy through [facility] in October, however that was when she was just visiting from out of town prior to moving here. ? ?The history is provided by the patient. No language interpreter was used.  ?Flank Pain ? ? ?  ? ?Home Medications ?Prior to Admission medications   ?Medication Sig Start Date End Date Taking? Authorizing Provider  ?atorvastatin (LIPITOR) 40 MG tablet Take 1 tablet (40 mg total) by mouth daily. 01/01/21   Damita Lack, MD  ?blood glucose meter kit and supplies KIT Dispense based on patient and insurance preference. Use up to four times daily as directed. 01/01/21   Amin, Jeanella Flattery, MD  ?busPIRone (BUSPAR) 15 MG tablet Take 1 tablet (15 mg total) by mouth 2 (two) times daily. 02/19/21   Gildardo Pounds, NP  ?carisoprodol (SOMA) 350 MG tablet Take 1 tablet (350 mg total) by mouth 3 (three) times daily. 03/17/21   Dorie Rank, MD  ?Dulaglutide (TRULICITY) 4.09 WJ/1.9JY SOPN Inject 0.75 mg into the skin once a week. 02/19/21 05/27/21  Gildardo Pounds, NP  ?escitalopram (LEXAPRO) 20 MG tablet Take 1 tablet (20 mg total) by mouth daily. 02/19/21 05/31/21   Gildardo Pounds, NP  ?etodolac (LODINE) 300 MG capsule Take 1 capsule (300 mg total) by mouth every 8 (eight) hours. 03/17/21   Dorie Rank, MD  ?gabapentin (NEURONTIN) 300 MG capsule Take 1 capsule (300 mg total) by mouth 3 (three) times daily. 02/19/21 05/31/21  Gildardo Pounds, NP  ?HYDROcodone-acetaminophen (NORCO/VICODIN) 5-325 MG tablet Take 1 tablet by mouth every 6 (six) hours as needed. 03/17/21   Dorie Rank, MD  ?insulin aspart (NOVOLOG FLEXPEN) 100 UNIT/ML FlexPen For blood sugars 0-150 give 0 units of insulin, 151-200 give 2 units of insulin, 201-250 give 4 units, 251-300 give 6 units, 301-350 give 8 units, 351-400 give 10 units,> 400 give 12 units and call M.D. ?Discussed hypoglycemia protocol. 02/19/21   Gildardo Pounds, NP  ?insulin detemir (LEVEMIR) 100 UNIT/ML FlexPen inject 20 units into the skin at bedtime 02/19/21     ?Insulin Pen Needle 31G X 5 MM MISC 100 each by Does not apply route 2 (two) times daily. 02/19/21   Gildardo Pounds, NP  ?lisinopril (ZESTRIL) 20 MG tablet Take 1 tablet (20 mg total) by mouth daily. 02/19/21   Gildardo Pounds, NP  ?metFORMIN (GLUCOPHAGE) 1000 MG tablet Take 1 tablet (1,000 mg total) by mouth 2 (two) times daily with a meal. 02/19/21   Gildardo Pounds, NP  ?tiZANidine (ZANAFLEX) 4 MG tablet Take 1 tablet (4 mg total) by mouth every 6 (six) hours as  needed for muscle spasms. 03/08/21   Gildardo Pounds, NP  ?traZODone (DESYREL) 150 MG tablet Take 1 tablet (150 mg total) by mouth at bedtime as needed for sleep. 02/19/21 05/31/21  Gildardo Pounds, NP  ?   ? ?Allergies    ?Patient has no known allergies.   ? ?Review of Systems   ?Review of Systems  ?Constitutional:  Negative for chills and fever.  ?Genitourinary:  Positive for flank pain and hematuria. Negative for difficulty urinating.  ?All other systems reviewed and are negative. ? ?Physical Exam ?Updated Vital Signs ?BP 105/62   Pulse 85   Temp 98.1 ?F (36.7 ?C)   Resp 18   Ht '5\' 3"'  (1.6 m)   Wt 94.3 kg   SpO2 100%    BMI 36.83 kg/m?  ?Physical Exam ? ?ED Results / Procedures / Treatments   ?Labs ?(all labs ordered are listed, but only abnormal results are displayed) ?Labs Reviewed  ?URINALYSIS, ROUTINE W REFLEX MICROSCOPIC - Abnormal; Notable for the following components:  ?    Result Value  ? Color, Urine RED (*)   ? APPearance CLOUDY (*)   ? Glucose, UA   (*)   ? Value: TEST NOT REPORTED DUE TO COLOR INTERFERENCE OF URINE PIGMENT  ? Hgb urine dipstick   (*)   ? Value: TEST NOT REPORTED DUE TO COLOR INTERFERENCE OF URINE PIGMENT  ? Bilirubin Urine   (*)   ? Value: TEST NOT REPORTED DUE TO COLOR INTERFERENCE OF URINE PIGMENT  ? Ketones, ur   (*)   ? Value: TEST NOT REPORTED DUE TO COLOR INTERFERENCE OF URINE PIGMENT  ? Protein, ur   (*)   ? Value: TEST NOT REPORTED DUE TO COLOR INTERFERENCE OF URINE PIGMENT  ? Nitrite   (*)   ? Value: TEST NOT REPORTED DUE TO COLOR INTERFERENCE OF URINE PIGMENT  ? Leukocytes,Ua   (*)   ? Value: TEST NOT REPORTED DUE TO COLOR INTERFERENCE OF URINE PIGMENT  ? All other components within normal limits  ?BASIC METABOLIC PANEL - Abnormal; Notable for the following components:  ? CO2 18 (*)   ? Glucose, Bld 143 (*)   ? BUN 23 (*)   ? All other components within normal limits  ?CBC - Abnormal; Notable for the following components:  ? WBC 16.4 (*)   ? Platelets 549 (*)   ? All other components within normal limits  ?URINALYSIS, MICROSCOPIC (REFLEX)  ? ? ?EKG ?None ? ?Radiology ?CT Renal Stone Study ? ?Result Date: 05/24/2021 ?CLINICAL DATA:  Flank pain, kidney stone suspected. Left flank pain for 3 days. Hematuria. EXAM: CT ABDOMEN AND PELVIS WITHOUT CONTRAST TECHNIQUE: Multidetector CT imaging of the abdomen and pelvis was performed following the standard protocol without IV contrast. RADIATION DOSE REDUCTION: This exam was performed according to the departmental dose-optimization program which includes automated exposure control, adjustment of the mA and/or kV according to patient size and/or use of  iterative reconstruction technique. COMPARISON:  CT examination dated January 03, 2021 FINDINGS: Lower chest: No acute abnormality. Hepatobiliary: No focal liver abnormality is seen. Low-attenuation hepatic parenchyma concerning for hepatic steatosis. No gallstones, gallbladder wall thickening, or biliary dilatation. Pancreas: Unremarkable. No pancreatic ductal dilatation or surrounding inflammatory changes. Spleen: Surgically removed. Adrenals/Urinary Tract: Adrenal glands are unremarkable. 8 mm calculus in the lower pole of the left kidney, unchanged. No evidence of hydronephrosis or ureteral calculus. No perinephric fat stranding. Bladder is unremarkable. Stomach/Bowel: Stomach is within normal limits. Appendix appears  normal. No evidence of bowel wall thickening, distention, or inflammatory changes. Scattered colonic diverticuli without evidence of acute diverticulitis. Vascular/Lymphatic: Mild aortic atherosclerosis. No enlarged abdominal or pelvic lymph nodes. Reproductive: Status post hysterectomy. No adnexal masses. Other: Fat containing umbilical hernia without evidence of obstruction or fat stranding. Musculoskeletal: No acute or significant osseous findings. IMPRESSION: 1. Nonobstructing 8 mm calculus in the lower pole of the left kidney. No evidence of hydronephrosis or ureteral calculus. No perinephric fat stranding. 2. Bowel loops are normal in caliber. Normal appendix. No evidence of colitis or diverticulitis. 3.  Fat containing umbilical hernia without evidence of obstruction. 4.  Hepatic steatosis. 5.  Status post splenectomy. Electronically Signed   By: Keane Police D.O.   On: 05/24/2021 18:12   ? ?Procedures ?Procedures  ? ? ?Medications Ordered in ED ?Medications  ?sodium chloride 0.9 % bolus 1,000 mL (1,000 mLs Intravenous New Bag/Given 05/24/21 2347)  ?ondansetron [facility]) injection 4 mg (4 mg Intravenous Given 05/24/21 2348)  ?HYDROmorphone (DILAUDID) injection 1 mg (1 mg Intravenous Given  05/24/21 2348)  ?ketorolac (TORADOL) 15 MG/ML injection 15 mg (15 mg Intravenous Given 05/24/21 2354)  ? ? ?ED Course/ Medical Decision Making/ A&P ?  ?                        ?Medical Decision Making ?Amount and/or

## 2021-05-24 NOTE — ED Provider Triage Note (Signed)
Emergency Medicine Provider Triage Evaluation Note ? ?Jamie Conley , a 46 y.o. female  was evaluated in triage.  Pt complains of left flank pain and hematuria. States that same has been ongoing for the past 3-4 days and has been worsening since. States that pain radiates into her groin and the midline of her back. States that she has an extensive history of kidney stones and usually requires lithotripsy for removal. States that she 'has a small urethra and kidney stones never pass without surgery.' ? ?Review of Systems  ?Positive:  ?Negative:  ? ?Physical Exam  ?BP 125/65 (BP Location: Right Arm)   Pulse 86   Temp 98.1 ?F (36.7 ?C)   Resp 18   Ht '5\' 3"'$  (1.6 m)   Wt 94.3 kg   SpO2 97%   BMI 36.83 kg/m?  ?Gen:   Awake, no distress   ?Resp:  Normal effort  ?MSK:   Moves extremities without difficulty  ?Other:   ? ?Medical Decision Making  ?Medically screening exam initiated at 5:12 PM.  Appropriate orders placed.  Jamie Conley was informed that the remainder of the evaluation will be completed by another provider, this initial triage assessment does not replace that evaluation, and the importance of remaining in the ED until their evaluation is complete. ? ? ?  ?Bud Face, PA-C ?05/24/21 1714 ? ?

## 2021-05-24 NOTE — ED Triage Notes (Signed)
Patient here POV from Home with Flank Pain. ? ?Endorses Back Pain that is Mid Lower Back and radiates to Left Flank. ? ?Began 3 days PTA. No Fevers. Endorses Hematuria. No N/V. Mild Diarrhea.  ? ?NAD Noted during Triage. A&Ox4. GCS 15. Ambulatory. ?

## 2021-05-25 ENCOUNTER — Encounter: Payer: Self-pay | Admitting: Nurse Practitioner

## 2021-05-25 MED ORDER — CARISOPRODOL 350 MG PO TABS: 350.0000 mg | ORAL_TABLET | Freq: Three times a day (TID) | ORAL | 0 refills | Status: DC

## 2021-05-25 MED ORDER — HYDROCODONE-ACETAMINOPHEN 5-325 MG PO TABS: 1.0000 | ORAL_TABLET | Freq: Four times a day (QID) | ORAL | 0 refills | Status: DC | PRN

## 2021-05-25 NOTE — Discharge Instructions (Addendum)
As we discussed, it appears that you have an 8 mm stone located in your left kidney.  I have given you a referral to urology with a number to call to schedule an appointment for management of same.  I have also given you pain medication to take as needed for severe pain.  Please do not drive or operate heavy machinery after taking this medication. ? ?Return if development of any new or worsening symptoms. ?

## 2021-05-25 NOTE — ED Notes (Signed)
A&Ox4, ambulates easily and without assistive device.  ?

## 2021-05-26 ENCOUNTER — Encounter: Payer: Self-pay | Admitting: Urology

## 2021-05-26 ENCOUNTER — Ambulatory Visit (INDEPENDENT_AMBULATORY_CARE_PROVIDER_SITE_OTHER): Payer: 59 | Admitting: Urology

## 2021-05-26 ENCOUNTER — Other Ambulatory Visit: Payer: Self-pay

## 2021-05-26 VITALS — BP 129/83 | HR 90

## 2021-05-26 DIAGNOSIS — N200 Calculus of kidney: Secondary | ICD-10-CM

## 2021-05-26 DIAGNOSIS — N3001 Acute cystitis with hematuria: Secondary | ICD-10-CM

## 2021-05-26 LAB — URINALYSIS, ROUTINE W REFLEX MICROSCOPIC
Bilirubin, UA: NEGATIVE
Nitrite, UA: POSITIVE — AB
Specific Gravity, UA: 1.02 (ref 1.005–1.030)
Urobilinogen, Ur: 4 mg/dL — ABNORMAL HIGH (ref 0.2–1.0)
pH, UA: 8.5 — ABNORMAL HIGH (ref 5.0–7.5)

## 2021-05-26 LAB — URINE CULTURE: Special Requests: NORMAL

## 2021-05-26 MED ORDER — OXYCODONE-ACETAMINOPHEN 10-325 MG PO TABS
1.0000 | ORAL_TABLET | ORAL | 0 refills | Status: DC | PRN
  Filled 2021-05-26: qty 30, 5d supply, fill #0

## 2021-05-26 MED ORDER — CEFUROXIME AXETIL 500 MG PO TABS
500.0000 mg | ORAL_TABLET | Freq: Two times a day (BID) | ORAL | 0 refills | Status: DC
  Filled 2021-05-26: qty 2, 1d supply, fill #0
  Filled 2021-05-26: qty 12, 6d supply, fill #0

## 2021-05-26 NOTE — H&P (View-Only) (Signed)
? ?05/26/2021 ?3:01 PM  ? ?[place] ?[birth year] ?[id number] ? ?Referring provider: Gildardo Pounds, NP ?[address] ?[address] ?[place],  [place] [address] ? ?nephrolithiasis ? ? ?HPI: ?Jamie Conley is 46yo here for evaluation of nephrolithiasis. She has had over 25 stone events since last visit. 5 days ago she developed left flank pain with associated urinary urgency, frequency and gross hematuria. UA today is concerning for infection. She presented to the ER on 4/17 and was diagnosed with a 82m left renal calculus. Urine culture at that time showed multiple species. She was not given an antibiotic. Her pain is sharp constant, mild to moderate and nonraditing.  ? ? ?PMH: ?Past Medical History:  ?Diagnosis Date  ? Anxiety   ? Asthma   ? Cancer ([facility]   ? Depression   ? Diabetes mellitus without complication ([facility]   ? Enlarged liver   ? Fibromyalgia   ? High cholesterol   ? History of kidney stones   ? Hypertension   ? Migraine   ? ? ?Surgical History: ?Past Surgical History:  ?Procedure Laterality Date  ? ABDOMINAL HYSTERECTOMY    ? ANTERIOR AND POSTERIOR REPAIR    ? colon polyectomy    ? CYSTOSCOPY/URETEROSCOPY/HOLMIUM LASER/STENT PLACEMENT Left 01/12/2021  ? Procedure: CYSTOSCOPY/URETEROSCOPY/HOLMIUM LASER/STENT PLACEMENT;  Surgeon: MVira Agar MD;  Location: [facility];  Service: Urology;  Laterality: Left;  ? LEFT OOPHORECTOMY    ? LITHOTRIPSY    ? NASAL POLYP SURGERY    ? SPLENECTOMY    ? WRIST SURGERY Left   ? ? ?Home Medications:  ?Allergies as of 05/26/2021   ?No Known Allergies ?  ? ?  ?Medication List  ?  ? ?  ? Accurate as of May 26, 2021  3:01 PM. If you have any questions, ask your nurse or doctor.  ?  ?  ? ?  ? ?atorvastatin 40 MG tablet ?Commonly known as: LIPITOR ?Take 1 tablet (40 mg total) by mouth daily. ?  ?blood glucose meter kit and supplies Kit ?Dispense based on patient and insurance preference. Use up to four times daily as directed. ?  ?busPIRone 15 MG tablet ?Commonly  known as: BUSPAR ?Take 1 tablet (15 mg total) by mouth 2 (two) times daily. ?  ?carisoprodol 350 MG tablet ?Commonly known as: SOMA ?Take 1 tablet (350 mg total) by mouth 3 (three) times daily. ?  ?escitalopram 20 MG tablet ?Commonly known as: LEXAPRO ?Take 1 tablet (20 mg total) by mouth daily. ?  ?etodolac 300 MG capsule ?Commonly known as: LODINE ?Take 1 capsule (300 mg total) by mouth every 8 (eight) hours. ?  ?gabapentin 300 MG capsule ?Commonly known as: NEURONTIN ?Take 1 capsule (300 mg total) by mouth 3 (three) times daily. ?  ?HYDROcodone-acetaminophen 5-325 MG tablet ?Commonly known as: NORCO/VICODIN ?Take 1 tablet by mouth every 6 (six) hours as needed for severe pain. ?  ?Insulin Pen Needle 31G X 5 MM Misc ?100 each by Does not apply route 2 (two) times daily. ?  ?Levemir FlexPen 100 UNIT/ML FlexPen ?Generic drug: insulin detemir ?inject 20 units into the skin at bedtime ?  ?lisinopril 20 MG tablet ?Commonly known as: ZESTRIL ?Take 1 tablet (20 mg total) by mouth daily. ?  ?metFORMIN 1000 MG tablet ?Commonly known as: GLUCOPHAGE ?Take 1 tablet (1,000 mg total) by mouth 2 (two) times daily with a meal. ?  ?NovoLOG FlexPen 100 UNIT/ML FlexPen ?Generic drug: insulin aspart ?For blood sugars 0-150 give 0 units  of insulin, 151-200 give 2 units of insulin, 201-250 give 4 units, 251-300 give 6 units, 301-350 give 8 units, 351-400 give 10 units,> 400 give 12 units and call M.D. ?Discussed hypoglycemia protocol. ?  ?tiZANidine 4 MG tablet ?Commonly known as: Zanaflex ?Take 1 tablet (4 mg total) by mouth every 6 (six) hours as needed for muscle spasms. ?  ?traZODone 150 MG tablet ?Commonly known as: DESYREL ?Take 1 tablet (150 mg total) by mouth at bedtime as needed for sleep. ?  ?Trulicity 3.15 VV/6.1YW Sopn ?Generic drug: Dulaglutide ?Inject 0.75 mg into the skin once a week. ?  ? ?  ? ? ?Allergies: No Known Allergies ? ?Family History: ?Family History  ?Problem Relation Age of Onset  ? Hypertension Mother   ?  High Cholesterol Mother   ? Heart attack Father   ? ? ?Social History:  reports that she has never smoked. She has never used smokeless tobacco. She reports current drug use. She reports that she does not drink alcohol. ? ?ROS: ?All other review of systems were reviewed and are negative except what is noted above in HPI ? ?Physical Exam: ?BP 129/83   Pulse 90   ?Constitutional:  Alert and oriented, No acute distress. ?HEENT: [place] AT, moist mucus membranes.  Trachea midline, no masses. ?Cardiovascular: No clubbing, cyanosis, or edema. ?Respiratory: Normal respiratory effort, no increased work of breathing. ?GI: Abdomen is soft, nontender, nondistended, no abdominal masses ?GU: No CVA tenderness.  ?Lymph: No cervical or inguinal lymphadenopathy. ?Skin: No rashes, bruises or suspicious lesions. ?Neurologic: Grossly intact, no focal deficits, moving all 4 extremities. ?Psychiatric: Normal mood and affect. ? ?Laboratory Data: ?Lab Results  ?Component Value Date  ? WBC 16.4 (H) 05/24/2021  ? HGB 13.0 05/24/2021  ? HCT 39.2 05/24/2021  ? MCV 91.0 05/24/2021  ? PLT 549 (H) 05/24/2021  ? ? ?Lab Results  ?Component Value Date  ? CREATININE 0.63 05/24/2021  ? ? ?No results found for: PSA ? ?No results found for: TESTOSTERONE ? ?Lab Results  ?Component Value Date  ? HGBA1C 11.4 (H) 12/25/2020  ? ? ?Urinalysis ?   ?Component Value Date/Time  ? COLORURINE RED (A) 05/24/2021 1704  ? APPEARANCEUR CLOUDY (A) 05/24/2021 1704  ? LABSPEC  05/24/2021 1704  ?  TEST NOT REPORTED DUE TO COLOR INTERFERENCE OF URINE PIGMENT  ? PHURINE  05/24/2021 1704  ?  TEST NOT REPORTED DUE TO COLOR INTERFERENCE OF URINE PIGMENT  ? GLUCOSEU (A) 05/24/2021 1704  ?  TEST NOT REPORTED DUE TO COLOR INTERFERENCE OF URINE PIGMENT  ? HGBUR (A) 05/24/2021 1704  ?  TEST NOT REPORTED DUE TO COLOR INTERFERENCE OF URINE PIGMENT  ? BILIRUBINUR (A) 05/24/2021 1704  ?  TEST NOT REPORTED DUE TO COLOR INTERFERENCE OF URINE PIGMENT  ? KETONESUR (A) 05/24/2021 1704  ?  TEST  NOT REPORTED DUE TO COLOR INTERFERENCE OF URINE PIGMENT  ? PROTEINUR (A) 05/24/2021 1704  ?  TEST NOT REPORTED DUE TO COLOR INTERFERENCE OF URINE PIGMENT  ? NITRITE (A) 05/24/2021 1704  ?  TEST NOT REPORTED DUE TO COLOR INTERFERENCE OF URINE PIGMENT  ? LEUKOCYTESUR (A) 05/24/2021 1704  ?  TEST NOT REPORTED DUE TO COLOR INTERFERENCE OF URINE PIGMENT  ? ? ?Lab Results  ?Component Value Date  ? BACTERIA NONE SEEN 05/24/2021  ? ? ?Pertinent Imaging: ?CT 05/24/2021: Images reviewed and discussed with the patient ?No results found for this or any previous visit. ? ?No results found for this or any previous  visit. ? ?No results found for this or any previous visit. ? ?No results found for this or any previous visit. ? ?No results found for this or any previous visit. ? ?No results found for this or any previous visit. ? ?No results found for this or any previous visit. ? ?Results for orders placed during the hospital encounter of 05/24/21 ? ?CT Renal Stone Study ? ?Narrative ?CLINICAL DATA:  Flank pain, kidney stone suspected. Left flank pain ?for 3 days. Hematuria. ? ?EXAM: ?CT ABDOMEN AND PELVIS WITHOUT CONTRAST ? ?TECHNIQUE: ?Multidetector CT imaging of the abdomen and pelvis was performed ?following the standard protocol without IV contrast. ? ?RADIATION DOSE REDUCTION: This exam was performed according to the ?departmental dose-optimization program which includes automated ?exposure control, adjustment of the mA and/or kV according to ?patient size and/or use of iterative reconstruction technique. ? ?COMPARISON:  CT examination dated January 03, 2021 ? ?FINDINGS: ?Lower chest: No acute abnormality. ? ?Hepatobiliary: No focal liver abnormality is seen. Low-attenuation ?hepatic parenchyma concerning for hepatic steatosis. No gallstones, ?gallbladder wall thickening, or biliary dilatation. ? ?Pancreas: Unremarkable. No pancreatic ductal dilatation or ?surrounding inflammatory changes. ? ?Spleen: Surgically  removed. ? ?Adrenals/Urinary Tract: Adrenal glands are unremarkable. 8 mm ?calculus in the lower pole of the left kidney, unchanged. No ?evidence of hydronephrosis or ureteral calculus. No perinephric fat ?stranding. Bla

## 2021-05-26 NOTE — Progress Notes (Signed)
? ?05/26/2021 ?3:01 PM  ? ?[place] ?[birth year] ?[id number] ? ?Referring provider: Gildardo Pounds, NP ?[address] ?[address] ?[place],  [place] [id number] ? ?nephrolithiasis ? ? ?HPI: ?[name] is 47yo here for evaluation of nephrolithiasis. She has had over 25 stone events since last visit. 5 days ago she developed left flank pain with associated urinary urgency, frequency and gross hematuria. UA today is concerning for infection. She presented to the ER on 4/17 and was diagnosed with a 82m left renal calculus. Urine culture at that time showed multiple species. She was not given an antibiotic. Her pain is sharp constant, mild to moderate and nonraditing.  ? ? ?PMH: ?Past Medical History:  ?Diagnosis Date  ? Anxiety   ? Asthma   ? Cancer ([facility]   ? Depression   ? Diabetes mellitus without complication ([facility]   ? Enlarged liver   ? Fibromyalgia   ? High cholesterol   ? History of kidney stones   ? Hypertension   ? Migraine   ? ? ?Surgical History: ?Past Surgical History:  ?Procedure Laterality Date  ? ABDOMINAL HYSTERECTOMY    ? ANTERIOR AND POSTERIOR REPAIR    ? colon polyectomy    ? CYSTOSCOPY/URETEROSCOPY/HOLMIUM LASER/STENT PLACEMENT Left 01/12/2021  ? Procedure: CYSTOSCOPY/URETEROSCOPY/HOLMIUM LASER/STENT PLACEMENT;  Surgeon: MVira Agar MD;  Location: [facility];  Service: Urology;  Laterality: Left;  ? LEFT OOPHORECTOMY    ? LITHOTRIPSY    ? NASAL POLYP SURGERY    ? SPLENECTOMY    ? WRIST SURGERY Left   ? ? ?Home Medications:  ?Allergies as of 05/26/2021   ?No Known Allergies ?  ? ?  ?Medication List  ?  ? ?  ? Accurate as of May 26, 2021  3:01 PM. If you have any questions, ask your nurse or doctor.  ?  ?  ? ?  ? ?atorvastatin 40 MG tablet ?Commonly known as: LIPITOR ?Take 1 tablet (40 mg total) by mouth daily. ?  ?blood glucose meter kit and supplies Kit ?Dispense based on patient and insurance preference. Use up to four times daily as directed. ?  ?busPIRone 15 MG tablet ?Commonly  known as: BUSPAR ?Take 1 tablet (15 mg total) by mouth 2 (two) times daily. ?  ?carisoprodol 350 MG tablet ?Commonly known as: SOMA ?Take 1 tablet (350 mg total) by mouth 3 (three) times daily. ?  ?escitalopram 20 MG tablet ?Commonly known as: LEXAPRO ?Take 1 tablet (20 mg total) by mouth daily. ?  ?etodolac 300 MG capsule ?Commonly known as: LODINE ?Take 1 capsule (300 mg total) by mouth every 8 (eight) hours. ?  ?gabapentin 300 MG capsule ?Commonly known as: NEURONTIN ?Take 1 capsule (300 mg total) by mouth 3 (three) times daily. ?  ?HYDROcodone-acetaminophen 5-325 MG tablet ?Commonly known as: NORCO/VICODIN ?Take 1 tablet by mouth every 6 (six) hours as needed for severe pain. ?  ?Insulin Pen Needle 31G X 5 MM Misc ?100 each by Does not apply route 2 (two) times daily. ?  ?Levemir FlexPen 100 UNIT/ML FlexPen ?Generic drug: insulin detemir ?inject 20 units into the skin at bedtime ?  ?lisinopril 20 MG tablet ?Commonly known as: ZESTRIL ?Take 1 tablet (20 mg total) by mouth daily. ?  ?metFORMIN 1000 MG tablet ?Commonly known as: GLUCOPHAGE ?Take 1 tablet (1,000 mg total) by mouth 2 (two) times daily with a meal. ?  ?NovoLOG FlexPen 100 UNIT/ML FlexPen ?Generic drug: insulin aspart ?For blood sugars 0-150 give 0 units  of insulin, 151-200 give 2 units of insulin, 201-250 give 4 units, 251-300 give 6 units, 301-350 give 8 units, 351-400 give 10 units,> 400 give 12 units and call M.D. ?Discussed hypoglycemia protocol. ?  ?tiZANidine 4 MG tablet ?Commonly known as: Zanaflex ?Take 1 tablet (4 mg total) by mouth every 6 (six) hours as needed for muscle spasms. ?  ?traZODone 150 MG tablet ?Commonly known as: DESYREL ?Take 1 tablet (150 mg total) by mouth at bedtime as needed for sleep. ?  ?Trulicity 3.15 VV/6.1YW Sopn ?Generic drug: Dulaglutide ?Inject 0.75 mg into the skin once a week. ?  ? ?  ? ? ?Allergies: No Known Allergies ? ?Family History: ?Family History  ?Problem Relation Age of Onset  ? Hypertension Mother   ?  High Cholesterol Mother   ? Heart attack Father   ? ? ?Social History:  reports that she has never smoked. She has never used smokeless tobacco. She reports current drug use. She reports that she does not drink alcohol. ? ?ROS: ?All other review of systems were reviewed and are negative except what is noted above in HPI ? ?Physical Exam: ?BP 129/83   Pulse 90   ?Constitutional:  Alert and oriented, No acute distress. ?HEENT: [place] AT, moist mucus membranes.  Trachea midline, no masses. ?Cardiovascular: No clubbing, cyanosis, or edema. ?Respiratory: Normal respiratory effort, no increased work of breathing. ?GI: Abdomen is soft, nontender, nondistended, no abdominal masses ?GU: No CVA tenderness.  ?Lymph: No cervical or inguinal lymphadenopathy. ?Skin: No rashes, bruises or suspicious lesions. ?Neurologic: Grossly intact, no focal deficits, moving all 4 extremities. ?Psychiatric: Normal mood and affect. ? ?Laboratory Data: ?Lab Results  ?Component Value Date  ? WBC 16.4 (H) 05/24/2021  ? HGB 13.0 05/24/2021  ? HCT 39.2 05/24/2021  ? MCV 91.0 05/24/2021  ? PLT 549 (H) 05/24/2021  ? ? ?Lab Results  ?Component Value Date  ? CREATININE 0.63 05/24/2021  ? ? ?No results found for: PSA ? ?No results found for: TESTOSTERONE ? ?Lab Results  ?Component Value Date  ? HGBA1C 11.4 (H) 12/25/2020  ? ? ?Urinalysis ?   ?Component Value Date/Time  ? COLORURINE RED (A) 05/24/2021 1704  ? APPEARANCEUR CLOUDY (A) 05/24/2021 1704  ? LABSPEC  05/24/2021 1704  ?  TEST NOT REPORTED DUE TO COLOR INTERFERENCE OF URINE PIGMENT  ? PHURINE  05/24/2021 1704  ?  TEST NOT REPORTED DUE TO COLOR INTERFERENCE OF URINE PIGMENT  ? GLUCOSEU (A) 05/24/2021 1704  ?  TEST NOT REPORTED DUE TO COLOR INTERFERENCE OF URINE PIGMENT  ? HGBUR (A) 05/24/2021 1704  ?  TEST NOT REPORTED DUE TO COLOR INTERFERENCE OF URINE PIGMENT  ? BILIRUBINUR (A) 05/24/2021 1704  ?  TEST NOT REPORTED DUE TO COLOR INTERFERENCE OF URINE PIGMENT  ? KETONESUR (A) 05/24/2021 1704  ?  TEST  NOT REPORTED DUE TO COLOR INTERFERENCE OF URINE PIGMENT  ? PROTEINUR (A) 05/24/2021 1704  ?  TEST NOT REPORTED DUE TO COLOR INTERFERENCE OF URINE PIGMENT  ? NITRITE (A) 05/24/2021 1704  ?  TEST NOT REPORTED DUE TO COLOR INTERFERENCE OF URINE PIGMENT  ? LEUKOCYTESUR (A) 05/24/2021 1704  ?  TEST NOT REPORTED DUE TO COLOR INTERFERENCE OF URINE PIGMENT  ? ? ?Lab Results  ?Component Value Date  ? BACTERIA NONE SEEN 05/24/2021  ? ? ?Pertinent Imaging: ?CT 05/24/2021: Images reviewed and discussed with the patient ?No results found for this or any previous visit. ? ?No results found for this or any previous  visit. ? ?No results found for this or any previous visit. ? ?No results found for this or any previous visit. ? ?No results found for this or any previous visit. ? ?No results found for this or any previous visit. ? ?No results found for this or any previous visit. ? ?Results for orders placed during the hospital encounter of 05/24/21 ? ?CT Renal Stone Study ? ?Narrative ?CLINICAL DATA:  Flank pain, kidney stone suspected. Left flank pain ?for 3 days. Hematuria. ? ?EXAM: ?CT ABDOMEN AND PELVIS WITHOUT CONTRAST ? ?TECHNIQUE: ?Multidetector CT imaging of the abdomen and pelvis was performed ?following the standard protocol without IV contrast. ? ?RADIATION DOSE REDUCTION: This exam was performed according to the ?departmental dose-optimization program which includes automated ?exposure control, adjustment of the mA and/or kV according to ?patient size and/or use of iterative reconstruction technique. ? ?COMPARISON:  CT examination dated January 03, 2021 ? ?FINDINGS: ?Lower chest: No acute abnormality. ? ?Hepatobiliary: No focal liver abnormality is seen. Low-attenuation ?hepatic parenchyma concerning for hepatic steatosis. No gallstones, ?gallbladder wall thickening, or biliary dilatation. ? ?Pancreas: Unremarkable. No pancreatic ductal dilatation or ?surrounding inflammatory changes. ? ?Spleen: Surgically  removed. ? ?Adrenals/Urinary Tract: Adrenal glands are unremarkable. 8 mm ?calculus in the lower pole of the left kidney, unchanged. No ?evidence of hydronephrosis or ureteral calculus. No perinephric fat ?stranding. Bla

## 2021-05-26 NOTE — Patient Instructions (Signed)
Dietary Guidelines to Help Prevent Kidney Stones Kidney stones are deposits of minerals and salts that form inside your kidneys. Your risk of developing kidney stones may be greater depending on your diet, your lifestyle, the medicines you take, and whether you have certain medical conditions. Most people can lower their chances of developing kidney stones by following the instructions below. Your dietitian may give you more specific instructions depending on your overall health and the type of kidney stones you tend to develop. What are tips for following this plan? Reading food labels  Choose foods with "no salt added" or "low-salt" labels. Limit your salt (sodium) intake to less than 1,500 mg a day. Choose foods with calcium for each meal and snack. Try to eat about 300 mg of calcium at each meal. Foods that contain 200-500 mg of calcium a serving include: 8 oz (237 mL) of milk, calcium-fortifiednon-dairy milk, and calcium-fortifiedfruit juice. Calcium-fortified means that calcium has been added to these drinks. 8 oz (237 mL) of kefir, yogurt, and soy yogurt. 4 oz (114 g) of tofu. 1 oz (28 g) of cheese. 1 cup (150 g) of dried figs. 1 cup (91 g) of cooked broccoli. One 3 oz (85 g) can of sardines or mackerel. Most people need 1,000-1,500 mg of calcium a day. Talk to your dietitian about how much calcium is recommended for you. Shopping Buy plenty of fresh fruits and vegetables. Most people do not need to avoid fruits and vegetables, even if these foods contain nutrients that may contribute to kidney stones. When shopping for convenience foods, choose: Whole pieces of fruit. Pre-made salads with dressing on the side. Low-fat fruit and yogurt smoothies. Avoid buying frozen meals or prepared deli foods. These can be high in sodium. Look for foods with live cultures, such as yogurt and kefir. Choose high-fiber grains, such as whole-wheat breads, oat bran, and wheat cereals. Cooking Do not add  salt to food when cooking. Place a salt shaker on the table and allow each person to add his or her own salt to taste. Use vegetable protein, such as beans, textured vegetable protein (TVP), or tofu, instead of meat in pasta, casseroles, and soups. Meal planning Eat less salt, if told by your dietitian. To do this: Avoid eating processed or pre-made food. Avoid eating fast food. Eat less animal protein, including cheese, meat, poultry, or fish, if told by your dietitian. To do this: Limit the number of times you have meat, poultry, fish, or cheese each week. Eat a diet free of meat at least 2 days a week. Eat only one serving each day of meat, poultry, fish, or seafood. When you prepare animal protein, cut pieces into small portion sizes. For most meat and fish, one serving is about the size of the palm of your hand. Eat at least five servings of fresh fruits and vegetables each day. To do this: Keep fruits and vegetables on hand for snacks. Eat one piece of fruit or a handful of berries with breakfast. Have a salad and fruit at lunch. Have two kinds of vegetables at dinner. Limit foods that are high in a substance called oxalate. These include: Spinach (cooked), rhubarb, beets, sweet potatoes, and Swiss chard. Peanuts. Potato chips, french fries, and baked potatoes with skin on. Nuts and nut products. Chocolate. If you regularly take a diuretic medicine, make sure to eat at least 1 or 2 servings of fruits or vegetables that are high in potassium each day. These include: Avocado. Banana. Orange, prune,   carrot, or tomato juice. Baked potato. Cabbage. Beans and split peas. Lifestyle  Drink enough fluid to keep your urine pale yellow. This is the most important thing you can do. Spread your fluid intake throughout the day. If you drink alcohol: Limit how much you use to: 0-1 drink a day for women who are not pregnant. 0-2 drinks a day for men. Be aware of how much alcohol is in your  drink. In the [place] one drink equals one 12 oz bottle of beer (355 mL), one 5 oz glass of wine (148 mL), or one 1 oz glass of hard liquor (44 mL). Lose weight if told by your health care provider. Work with your dietitian to find an eating plan and weight loss strategies that work best for you. General information Talk to your health care provider and dietitian about taking daily supplements. You may be told the following depending on your health and the cause of your kidney stones: Not to take supplements with vitamin C. To take a calcium supplement. To take a daily probiotic supplement. To take other supplements such as magnesium, fish oil, or vitamin B6. Take over-the-counter and prescription medicines only as told by your health care provider. These include supplements. What foods should I limit? Limit your intake of the following foods, or eat them as told by your dietitian. Vegetables Spinach. Rhubarb. Beets. Canned vegetables. Pickles. Olives. Baked potatoes with skin. Grains Wheat bran. Baked goods. Salted crackers. Cereals high in sugar. Meats and other proteins Nuts. Nut butters. Large portions of meat, poultry, or fish. Salted, precooked, or cured meats, such as sausages, meat loaves, and hot dogs. Dairy Cheese. Beverages Regular soft drinks. Regular vegetable juice. Seasonings and condiments Seasoning blends with salt. Salad dressings. Soy sauce. Ketchup. Barbecue sauce. Other foods Canned soups. Canned pasta sauce. Casseroles. Pizza. Lasagna. Frozen meals. Potato chips. French fries. The items listed above may not be a complete list of foods and beverages you should limit. Contact a dietitian for more information. What foods should I avoid? Talk to your dietitian about specific foods you should avoid based on the type of kidney stones you have and your overall health. Fruits Grapefruit. The item listed above may not be a complete list of foods and beverages you should  avoid. Contact a dietitian for more information. Summary Kidney stones are deposits of minerals and salts that form inside your kidneys. You can lower your risk of kidney stones by making changes to your diet. The most important thing you can do is drink enough fluid. Drink enough fluid to keep your urine pale yellow. Talk to your dietitian about how much calcium you should have each day, and eat less salt and animal protein as told by your dietitian. This information is not intended to replace advice given to you by your health care provider. Make sure you discuss any questions you have with your health care provider. Document Revised: 10/05/2020 Document Reviewed: 10/05/2020 Elsevier Patient Education  2023 [employer]  

## 2021-05-27 ENCOUNTER — Other Ambulatory Visit: Payer: 59

## 2021-05-27 ENCOUNTER — Other Ambulatory Visit: Payer: Self-pay

## 2021-05-27 ENCOUNTER — Other Ambulatory Visit: Payer: Self-pay | Admitting: Nurse Practitioner

## 2021-05-27 DIAGNOSIS — N200 Calculus of kidney: Secondary | ICD-10-CM

## 2021-05-28 ENCOUNTER — Encounter (HOSPITAL_BASED_OUTPATIENT_CLINIC_OR_DEPARTMENT_OTHER): Payer: Self-pay | Admitting: Pediatrics

## 2021-05-28 ENCOUNTER — Other Ambulatory Visit: Payer: Self-pay

## 2021-05-28 ENCOUNTER — Emergency Department (HOSPITAL_BASED_OUTPATIENT_CLINIC_OR_DEPARTMENT_OTHER)
Admission: EM | Admit: 2021-05-28 | Discharge: 2021-05-28 | Disposition: A | Discharge status: Home / Self Care | Payer: 59 | Attending: Emergency Medicine | Admitting: Emergency Medicine

## 2021-05-28 DIAGNOSIS — R109 Unspecified abdominal pain: Secondary | ICD-10-CM | POA: Diagnosis present

## 2021-05-28 DIAGNOSIS — R1012 Left upper quadrant pain: Secondary | ICD-10-CM | POA: Insufficient documentation

## 2021-05-28 DIAGNOSIS — R1033 Periumbilical pain: Secondary | ICD-10-CM | POA: Insufficient documentation

## 2021-05-28 DIAGNOSIS — Z794 Long term (current) use of insulin: Secondary | ICD-10-CM | POA: Diagnosis not present

## 2021-05-28 DIAGNOSIS — Z79899 Other long term (current) drug therapy: Secondary | ICD-10-CM | POA: Diagnosis not present

## 2021-05-28 DIAGNOSIS — R1013 Epigastric pain: Secondary | ICD-10-CM | POA: Insufficient documentation

## 2021-05-28 LAB — COMPREHENSIVE METABOLIC PANEL
ALT: 17 U/L (ref 0–44)
AST: 13 U/L — ABNORMAL LOW (ref 15–41)
Albumin: 4.2 g/dL (ref 3.5–5.0)
Alkaline Phosphatase: 63 U/L (ref 38–126)
Anion gap: 12 (ref 5–15)
BUN: 28 mg/dL — ABNORMAL HIGH (ref 6–20)
CO2: 22 mmol/L (ref 22–32)
Calcium: 9.6 mg/dL (ref 8.9–10.3)
Chloride: 109 mmol/L (ref 98–111)
Creatinine, Ser: 0.97 mg/dL (ref 0.44–1.00)
GFR, Estimated: >60 mL/min (ref >60)
Glucose, Bld: 146 mg/dL — ABNORMAL HIGH (ref 70–99)
Potassium: 3.8 mmol/L (ref 3.5–5.1)
Sodium: 143 mmol/L (ref 135–145)
Total Bilirubin: 0.2 mg/dL — ABNORMAL LOW (ref 0.3–1.2)
Total Protein: 7.3 g/dL (ref 6.5–8.1)

## 2021-05-28 LAB — CBC WITH DIFFERENTIAL/PLATELET
Abs Immature Granulocytes: 0.06 10*3/uL (ref 0.00–0.07)
Basophils Absolute: 0.1 10*3/uL (ref 0.0–0.1)
Basophils Relative: 1 %
Eosinophils Absolute: 0.6 10*3/uL — ABNORMAL HIGH (ref 0.0–0.5)
Eosinophils Relative: 3 %
HCT: 38.7 % (ref 36.0–46.0)
Hemoglobin: 12.6 g/dL (ref 12.0–15.0)
Immature Granulocytes: 0 %
Lymphocytes Relative: 46 %
Lymphs Abs: 8.4 10*3/uL — ABNORMAL HIGH (ref 0.7–4.0)
MCH: 29.7 pg (ref 26.0–34.0)
MCHC: 32.6 g/dL (ref 30.0–36.0)
MCV: 91.3 fL (ref 80.0–100.0)
Monocytes Absolute: 0.9 10*3/uL (ref 0.1–1.0)
Monocytes Relative: 5 %
Neutro Abs: 8.1 10*3/uL — ABNORMAL HIGH (ref 1.7–7.7)
Neutrophils Relative %: 45 %
Platelets: 555 10*3/uL — ABNORMAL HIGH (ref 150–400)
RBC: 4.24 MIL/uL (ref 3.87–5.11)
RDW: 15.9 % — ABNORMAL HIGH (ref 11.5–15.5)
WBC: 18.2 10*3/uL — ABNORMAL HIGH (ref 4.0–10.5)
nRBC: 0 % (ref 0.0–0.2)

## 2021-05-28 LAB — URINALYSIS, ROUTINE W REFLEX MICROSCOPIC
Bilirubin Urine: NEGATIVE
Glucose, UA: NEGATIVE mg/dL
Hgb urine dipstick: NEGATIVE
Ketones, ur: NEGATIVE mg/dL
Leukocytes,Ua: NEGATIVE
Nitrite: NEGATIVE
Specific Gravity, Urine: 1.026 (ref 1.005–1.030)
pH: 5.5 (ref 5.0–8.0)

## 2021-05-28 LAB — URINE CULTURE

## 2021-05-28 LAB — LIPASE, BLOOD: Lipase: 25 U/L (ref 11–51)

## 2021-05-28 MED ORDER — ONDANSETRON HCL 4 MG/2ML IJ SOLN
4.0000 mg | Freq: Once | INTRAMUSCULAR | Status: AC
  Administered 2021-05-28: 4 mg via INTRAVENOUS
  Filled 2021-05-28: qty 2

## 2021-05-28 MED ORDER — HYDROMORPHONE HCL 1 MG/ML IJ SOLN
0.5000 mg | INTRAMUSCULAR | Status: AC | PRN
  Administered 2021-05-28 (×3): 0.5 mg via INTRAVENOUS
  Filled 2021-05-28 (×3): qty 1

## 2021-05-28 MED ORDER — OXYCODONE-ACETAMINOPHEN 5-325 MG PO TABS
2.0000 | ORAL_TABLET | Freq: Once | ORAL | Status: AC
  Administered 2021-05-28: 2 via ORAL
  Filled 2021-05-28: qty 2

## 2021-05-28 MED ORDER — KETOROLAC TROMETHAMINE 15 MG/ML IJ SOLN
15.0000 mg | Freq: Once | INTRAMUSCULAR | Status: AC
  Administered 2021-05-28: 15 mg via INTRAVENOUS
  Filled 2021-05-28: qty 1

## 2021-05-28 MED ORDER — CARISOPRODOL 350 MG PO TABS: 350.0000 mg | ORAL_TABLET | Freq: Three times a day (TID) | ORAL | 0 refills | Status: DC

## 2021-05-28 NOTE — ED Notes (Signed)
Discharge instructions, follow up care, and prescriptions reviewed and explained, pt verbalized understanding.  

## 2021-05-28 NOTE — ED Triage Notes (Signed)
C/O left sided abdominal pain from kidney stones; reported scheduled for surgery may 1st; but unable to get relief from RX percocet and soma at home.  ?

## 2021-05-28 NOTE — Discharge Instructions (Signed)
Please read and follow all provided instructions. ? ?Your diagnoses today include:  ?1. Flank pain   ? ? ?Tests performed today include: ?Blood cell counts and platelets ?Kidney and liver function tests ?Pancreas function test (called lipase) ?Urine test to look for infection ?A blood or urine test for pregnancy (women only) ?Vital signs. See below for your results today.  ? ?Home care instructions:  ?Follow any educational materials contained in this packet. ? ?Follow-up instructions: ?Please follow-up with your primary care provider in the next 3 days for further evaluation of your symptoms.   ? ?Return instructions:  ?SEEK IMMEDIATE MEDICAL ATTENTION IF: ?The pain does not go away or becomes severe  ?A temperature above 101F develops  ?Repeated vomiting occurs (multiple episodes)  ?The pain becomes localized to portions of the abdomen. The right side could possibly be appendicitis. In an adult, the left lower portion of the abdomen could be colitis or diverticulitis.  ?Blood is being passed in stools or vomit (bright red or black tarry stools)  ?You develop chest pain, difficulty breathing, dizziness or fainting, or become confused, poorly responsive, or inconsolable (young children) ?If you have any other emergent concerns regarding your health ? ?Additional Information: ?Abdominal (belly) pain can be caused by many things. Your caregiver performed an examination and possibly ordered blood/urine tests and imaging (CT scan, x-rays, ultrasound). Many cases can be observed and treated at home after initial evaluation in the emergency department. Even though you are being discharged home, abdominal pain can be unpredictable. Therefore, you need a repeated exam if your pain does not resolve, returns, or worsens. Most patients with abdominal pain don't have to be admitted to the hospital or have surgery, but serious problems like appendicitis and gallbladder attacks can start out as nonspecific pain. Many abdominal  conditions cannot be diagnosed in one visit, so follow-up evaluations are very important. ? ?Your vital signs today were: ?BP 105/71 (BP Location: Left Arm)   Pulse 86   Temp 97.7 ?F (36.5 ?C) (Oral)   Resp 18   Ht '5\' 3"'$  (1.6 m)   Wt 94.3 kg   SpO2 100%   BMI 36.83 kg/m?  ?If your blood pressure (bp) was elevated above 135/85 this visit, please have this repeated by your doctor within one month. ?-------------- ? ?

## 2021-05-28 NOTE — ED Provider Notes (Signed)
?[facility] EMERGENCY DEPT ?Provider Note ? ? ?CSN: [id number] ?Arrival date & time: 05/28/21  1547 ? ?  ? ?History ? ?Chief Complaint  ?Patient presents with  ? Abdominal Pain  ? ? ?Jamie Conley is a 46 y.o. female. ? ?Patient with history of splenectomy, frequent kidney stones, hysterectomy -- presents to the emergency department today for uncontrolled left-sided abdominal and flank pain.  Patient has had this pain for several days.  She was seen in the emergency department and had a CT scan demonstrating a left renal stone without other complicating factors.  She followed up with urology, Dr. Alyson Ingles, and is scheduled for stone surgery in 2 weeks.  She has been taking Percocet 10/325, last dose 2 hours ago.  Also taking Soma.  Pain is not controlled.  No fevers, chest pain, shortness of breath, vomiting, diarrhea.  No dysuria, increased frequency or urgency.  She did have hematuria but this is now resolved.  She was placed on Macrobid by urology.  Most previous urine culture grew out multiple species. ? ? ?  ? ?Home Medications ?Prior to Admission medications   ?Medication Sig Start Date End Date Taking? Authorizing Provider  ?atorvastatin (LIPITOR) 40 MG tablet Take 1 tablet (40 mg total) by mouth daily. 01/01/21   Damita Lack, MD  ?blood glucose meter kit and supplies KIT Dispense based on patient and insurance preference. Use up to four times daily as directed. 01/01/21   Amin, Jeanella Flattery, MD  ?busPIRone (BUSPAR) 15 MG tablet Take 1 tablet (15 mg total) by mouth 2 (two) times daily. 02/19/21   Gildardo Pounds, NP  ?carisoprodol (SOMA) 350 MG tablet Take 1 tablet (350 mg total) by mouth 3 (three) times daily. 05/25/21   Smoot, Leary Roca, PA-C  ?cefUROXime (CEFTIN) 500 MG tablet Take 1 tablet (500 mg total) by mouth 2 (two) times daily with a meal. 05/26/21   McKenzie, Candee Furbish, MD  ?escitalopram (LEXAPRO) 20 MG tablet Take 1 tablet (20 mg total) by mouth daily. 02/19/21 05/31/21   Gildardo Pounds, NP  ?etodolac (LODINE) 300 MG capsule Take 1 capsule (300 mg total) by mouth every 8 (eight) hours. 03/17/21   Dorie Rank, MD  ?gabapentin (NEURONTIN) 300 MG capsule Take 1 capsule (300 mg total) by mouth 3 (three) times daily. 02/19/21 05/31/21  Gildardo Pounds, NP  ?HYDROcodone-acetaminophen (NORCO/VICODIN) 5-325 MG tablet Take 1 tablet by mouth every 6 (six) hours as needed for severe pain. 05/25/21   Smoot, Leary Roca, PA-C  ?insulin aspart (NOVOLOG FLEXPEN) 100 UNIT/ML FlexPen For blood sugars 0-150 give 0 units of insulin, 151-200 give 2 units of insulin, 201-250 give 4 units, 251-300 give 6 units, 301-350 give 8 units, 351-400 give 10 units,> 400 give 12 units and call M.D. ?Discussed hypoglycemia protocol. 02/19/21   Gildardo Pounds, NP  ?insulin detemir (LEVEMIR) 100 UNIT/ML FlexPen inject 20 units into the skin at bedtime 02/19/21     ?Insulin Pen Needle 31G X 5 MM MISC 100 each by Does not apply route 2 (two) times daily. 02/19/21   Gildardo Pounds, NP  ?lisinopril (ZESTRIL) 20 MG tablet Take 1 tablet (20 mg total) by mouth daily. 02/19/21   Gildardo Pounds, NP  ?metFORMIN (GLUCOPHAGE) 1000 MG tablet Take 1 tablet (1,000 mg total) by mouth 2 (two) times daily with a meal. ?Patient not taking: Reported on 05/26/2021 02/19/21   Gildardo Pounds, NP  ?oxyCODONE-acetaminophen (PERCOCET) 10-325 MG tablet Take 1 tablet by  mouth every 4 (four) hours as needed for pain. 05/26/21   McKenzie, Candee Furbish, MD  ?tiZANidine (ZANAFLEX) 4 MG tablet Take 1 tablet (4 mg total) by mouth every 6 (six) hours as needed for muscle spasms. 03/08/21   Gildardo Pounds, NP  ?traZODone (DESYREL) 150 MG tablet Take 1 tablet (150 mg total) by mouth at bedtime as needed for sleep. 02/19/21 05/31/21  Gildardo Pounds, NP  ?   ? ?Allergies    ?Patient has no known allergies.   ? ?Review of Systems   ?Review of Systems ? ?Physical Exam ?Updated Vital Signs ?BP (!) 127/109 (BP Location: Left Arm)   Pulse 97   Temp 98.2 ?F (36.8 ?C)    Resp 18   Ht '5\' 3"'  (1.6 m)   Wt 94.3 kg   SpO2 99%   BMI 36.83 kg/m?  ?Physical Exam ?Vitals and nursing note reviewed.  ?Constitutional:   ?   General: She is not in acute distress. ?   Appearance: She is well-developed.  ?HENT:  ?   Head: Normocephalic and atraumatic.  ?   Right Ear: External ear normal.  ?   Left Ear: External ear normal.  ?   Nose: Nose normal.  ?Eyes:  ?   Conjunctiva/sclera: Conjunctivae normal.  ?Cardiovascular:  ?   Rate and Rhythm: Normal rate and regular rhythm.  ?   Heart sounds: No murmur heard. ?Pulmonary:  ?   Effort: No respiratory distress.  ?   Breath sounds: No wheezing, rhonchi or rales.  ?Abdominal:  ?   Palpations: Abdomen is soft.  ?   Tenderness: There is abdominal tenderness in the epigastric area, periumbilical area and left upper quadrant. There is left CVA tenderness. There is no guarding or rebound.  ?Musculoskeletal:  ?   Cervical back: Normal range of motion and neck supple.  ?   Right lower leg: No edema.  ?   Left lower leg: No edema.  ?Skin: ?   General: Skin is warm and dry.  ?   Findings: No rash.  ?Neurological:  ?   General: No focal deficit present.  ?   Mental Status: She is alert. Mental status is at baseline.  ?   Motor: No weakness.  ?Psychiatric:     ?   Mood and Affect: Mood normal.  ? ? ?ED Results / Procedures / Treatments   ?Labs ?(all labs ordered are listed, but only abnormal results are displayed) ?Labs Reviewed  ?CBC WITH DIFFERENTIAL/PLATELET - Abnormal; Notable for the following components:  ?    Result Value  ? WBC 18.2 (*)   ? RDW 15.9 (*)   ? Platelets 555 (*)   ? Neutro Abs 8.1 (*)   ? Lymphs Abs 8.4 (*)   ? Eosinophils Absolute 0.6 (*)   ? All other components within normal limits  ?COMPREHENSIVE METABOLIC PANEL - Abnormal; Notable for the following components:  ? Glucose, Bld 146 (*)   ? BUN 28 (*)   ? AST 13 (*)   ? Total Bilirubin 0.2 (*)   ? All other components within normal limits  ?URINALYSIS, ROUTINE W REFLEX MICROSCOPIC -  Abnormal; Notable for the following components:  ? Protein, ur TRACE (*)   ? All other components within normal limits  ?LIPASE, BLOOD  ? ? ?EKG ?None ? ?Radiology ?No results found. ? ?Procedures ?Procedures  ? ? ?Medications Ordered in ED ?Medications  ?oxyCODONE-acetaminophen (PERCOCET/ROXICET) 5-325 MG per tablet 2 tablet (has no  administration in time range)  ?HYDROmorphone (DILAUDID) injection 0.5 mg (0.5 mg Intravenous Given 05/28/21 2132)  ?ondansetron (ZOFRAN) injection 4 mg (4 mg Intravenous Given 05/28/21 1646)  ?ketorolac (TORADOL) 15 MG/ML injection 15 mg (15 mg Intravenous Given 05/28/21 1647)  ? ? ?ED Course/ Medical Decision Making/ A&P ?  ? ?Patient seen and examined. History obtained directly from patient.  Reviewed previous ED visit and recent urology notes. ? ?Labs/EKG: Ordered CBC, CMP, lipase, UA to ensure no changes from recent labs. ? ?Imaging: None ordered.  Reviewed previous CT imaging demonstrating left renal stone, no obstructive ureteral stones. ? ?Medications/Fluids: Ordered: Dilaudid, Toradol.  ? ?Most recent vital signs reviewed and are as follows: ?BP (!) 127/109 (BP Location: Left Arm)   Pulse 97   Temp 98.2 ?F (36.8 ?C)   Resp 18   Ht '5\' 3"'  (1.6 m)   Wt 94.3 kg   SpO2 99%   BMI 36.83 kg/m?  ? ?Initial impression: Left-sided abdominal pain, known renal stone. ? ?Patient stable on reassessment.  Continues to have pain.  Requested RN to give additional pain medication. ? ?10:13 PM Reassessment performed. Patient appears improved but continues to complain of pain. ? ?Labs personally reviewed and interpreted including: CBC with white blood cell count of 18,000, consistent with previous visit when CT was performed and several other visits dating back to last year; CMP with elevated glucose, normal kidney function; lipase normal; UA without signs of infection or bleeding today. ? ?Reviewed pertinent lab work and imaging with patient at bedside. Questions answered.  ? ?Most current  vital signs reviewed and are as follows: ?BP 105/71 (BP Location: Left Arm)   Pulse 86   Temp 97.7 ?F (36.5 ?C) (Oral)   Resp 18   Ht '5\' 3"'  (1.6 m)   Wt 94.3 kg   SpO2 100%   BMI 36.83 kg/m?  ? ?Plan: Discharge to home.  ? ?

## 2021-05-31 ENCOUNTER — Other Ambulatory Visit: Payer: 59

## 2021-05-31 ENCOUNTER — Other Ambulatory Visit: Payer: Self-pay

## 2021-05-31 DIAGNOSIS — N3001 Acute cystitis with hematuria: Secondary | ICD-10-CM

## 2021-05-31 LAB — URINALYSIS, ROUTINE W REFLEX MICROSCOPIC
Bilirubin, UA: NEGATIVE
Glucose, UA: NEGATIVE
Leukocytes,UA: NEGATIVE
Nitrite, UA: NEGATIVE
Protein,UA: NEGATIVE
RBC, UA: NEGATIVE
Specific Gravity, UA: 1.025 (ref 1.005–1.030)
Urobilinogen, Ur: 0.2 mg/dL (ref 0.2–1.0)
pH, UA: 5.5 (ref 5.0–7.5)

## 2021-06-01 ENCOUNTER — Other Ambulatory Visit: Payer: Self-pay

## 2021-06-02 ENCOUNTER — Other Ambulatory Visit: Payer: Self-pay | Admitting: Urology

## 2021-06-02 ENCOUNTER — Telehealth: Payer: Self-pay

## 2021-06-02 MED ORDER — OXYCODONE-ACETAMINOPHEN 10-325 MG PO TABS: 1.0000 | ORAL_TABLET | ORAL | 0 refills | Status: DC | PRN

## 2021-06-02 NOTE — Telephone Encounter (Signed)
Received posting from Dr. Alyson Ingles for cystoscopy with left retrograde pyelogram left ureteroscopy with laser with left stent placement. ? ?I spoke with Jamie Conley. We have discussed possible surgery dates and 06/09/2021 was agreed upon by all parties. Patient given information about surgery date, what to expect pre-operatively and post operatively.  ?  ?We discussed that a pre-op nurse will be calling to set up the pre-op visit that will take place prior to surgery. Informed patient that our office will communicate any additional care to be provided after surgery.  ?  ?Patients questions or concerns were discussed during our call. Advised to call our office should there be any additional information, questions or concerns that arise. Patient verbalized understanding.   ?

## 2021-06-03 ENCOUNTER — Emergency Department (HOSPITAL_BASED_OUTPATIENT_CLINIC_OR_DEPARTMENT_OTHER): Payer: 59

## 2021-06-03 ENCOUNTER — Encounter (HOSPITAL_BASED_OUTPATIENT_CLINIC_OR_DEPARTMENT_OTHER): Payer: Self-pay

## 2021-06-03 ENCOUNTER — Emergency Department (HOSPITAL_BASED_OUTPATIENT_CLINIC_OR_DEPARTMENT_OTHER)
Admission: EM | Admit: 2021-06-03 | Discharge: 2021-06-03 | Disposition: A | Discharge status: Home / Self Care | Payer: 59 | Attending: Emergency Medicine | Admitting: Emergency Medicine

## 2021-06-03 ENCOUNTER — Other Ambulatory Visit: Payer: Self-pay

## 2021-06-03 DIAGNOSIS — I10 Essential (primary) hypertension: Secondary | ICD-10-CM | POA: Insufficient documentation

## 2021-06-03 DIAGNOSIS — R1030 Lower abdominal pain, unspecified: Secondary | ICD-10-CM | POA: Diagnosis present

## 2021-06-03 DIAGNOSIS — E119 Type 2 diabetes mellitus without complications: Secondary | ICD-10-CM | POA: Insufficient documentation

## 2021-06-03 DIAGNOSIS — Z79899 Other long term (current) drug therapy: Secondary | ICD-10-CM | POA: Insufficient documentation

## 2021-06-03 DIAGNOSIS — Z7984 Long term (current) use of oral hypoglycemic drugs: Secondary | ICD-10-CM | POA: Insufficient documentation

## 2021-06-03 DIAGNOSIS — Z794 Long term (current) use of insulin: Secondary | ICD-10-CM | POA: Insufficient documentation

## 2021-06-03 DIAGNOSIS — N200 Calculus of kidney: Secondary | ICD-10-CM | POA: Diagnosis not present

## 2021-06-03 LAB — URINALYSIS, ROUTINE W REFLEX MICROSCOPIC
Bilirubin Urine: NEGATIVE
Glucose, UA: NEGATIVE mg/dL
Hgb urine dipstick: NEGATIVE
Ketones, ur: NEGATIVE mg/dL
Nitrite: NEGATIVE
Specific Gravity, Urine: 1.027 (ref 1.005–1.030)
pH: 6 (ref 5.0–8.0)

## 2021-06-03 MED ORDER — HYDROMORPHONE HCL 1 MG/ML IJ SOLN
1.0000 mg | Freq: Once | INTRAMUSCULAR | Status: AC
  Administered 2021-06-03: 1 mg via INTRAVENOUS
  Filled 2021-06-03: qty 1

## 2021-06-03 MED ORDER — CARISOPRODOL 350 MG PO TABS
350.0000 mg | ORAL_TABLET | Freq: Three times a day (TID) | ORAL | 0 refills | Status: DC
Start: 2021-06-03 — End: 2021-06-23

## 2021-06-03 MED ORDER — ONDANSETRON HCL 4 MG/2ML IJ SOLN
4.0000 mg | Freq: Once | INTRAMUSCULAR | Status: AC
  Administered 2021-06-03: 4 mg via INTRAVENOUS
  Filled 2021-06-03: qty 2

## 2021-06-03 NOTE — ED Notes (Signed)
Lab tubes drawn and sent to lab to hold;  Jamie Conley; and blue tubes sent ?

## 2021-06-03 NOTE — ED Provider Notes (Signed)
?[facility] EMERG[facility] DEPT ?Provider Note ? ? ?CSN: [id number] ?Arrival date & time: 06/03/21  1728 ? ?  ? ?History ? ?Chief Complaint  ?Patient presents with  ? Flank Pain  ? ? ?Jamie Conley is a 46 y.o. female. ? ?Patient with a longstanding history of significant pain from retained stones in her kidneys.  States unable to ever pass them.  Is being seen by Dr. Alyson Ingles on May 3 they are planning to do a cystoscope and place a stent.  2 give some relief to the kidney.  Patient complaining of bilateral flank pain.  But the main concern has been the left side.  Patient has been seen 3 times since the 17th for this concern.  Patient seen April 17 April 21 and then again today.  No fevers.  No nausea no vomiting.  Patient already on Soma and already on Percocet.  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  Is been dealing with this for many years. ? ?Past medical history sniffing for diabetes high cholesterol hypertension history of the nephrolithiasis.  Past surgeries include abdominal hysterectomy splenectomy cystoscopically December 22 with laser and stent placement. ? ? ?  ? ?Home Medications ?Prior to Admission medications   ?Medication Sig Start Date End Date Taking? Authorizing Provider  ?carisoprodol (SOMA) 350 MG tablet Take 1 tablet (350 mg total) by mouth 3 (three) times daily. 06/03/21  Yes Fredia Sorrow, MD  ?atorvastatin (LIPITOR) 40 MG tablet Take 1 tablet (40 mg total) by mouth daily. 01/01/21   Damita Lack, MD  ?blood glucose meter kit and supplies KIT Dispense based on patient and insurance preference. Use up to four times daily as directed. 01/01/21   Amin, Jeanella Flattery, MD  ?busPIRone (BUSPAR) 15 MG tablet Take 1 tablet (15 mg total) by mouth 2 (two) times daily. 02/19/21   Gildardo Pounds, NP  ?carisoprodol (SOMA) 350 MG tablet Take 1 tablet (350 mg total) by mouth 3 (three) times daily. 05/28/21   Carlisle Cater, PA-C  ?cefUROXime (CEFTIN) 500 MG tablet Take 1  tablet (500 mg total) by mouth 2 (two) times daily with a meal. 05/26/21   McKenzie, Candee Furbish, MD  ?escitalopram (LEXAPRO) 20 MG tablet Take 1 tablet (20 mg total) by mouth daily. 02/19/21 07/03/21  Gildardo Pounds, NP  ?etodolac (LODINE) 300 MG capsule Take 1 capsule (300 mg total) by mouth every 8 (eight) hours. 03/17/21   Dorie Rank, MD  ?gabapentin (NEURONTIN) 300 MG capsule Take 1 capsule (300 mg total) by mouth 3 (three) times daily. 02/19/21 05/31/21  Gildardo Pounds, NP  ?HYDROcodone-acetaminophen (NORCO/VICODIN) 5-325 MG tablet Take 1 tablet by mouth every 6 (six) hours as needed for severe pain. 05/25/21   Smoot, Leary Roca, PA-C  ?insulin aspart (NOVOLOG FLEXPEN) 100 UNIT/ML FlexPen For blood sugars 0-150 give 0 units of insulin, 151-200 give 2 units of insulin, 201-250 give 4 units, 251-300 give 6 units, 301-350 give 8 units, 351-400 give 10 units,> 400 give 12 units and call M.D. ?Discussed hypoglycemia protocol. 02/19/21   Gildardo Pounds, NP  ?insulin detemir (LEVEMIR) 100 UNIT/ML FlexPen inject 20 units into the skin at bedtime 02/19/21     ?Insulin Pen Needle 31G X 5 MM MISC 100 each by Does not apply route 2 (two) times daily. 02/19/21   Gildardo Pounds, NP  ?lisinopril (ZESTRIL) 20 MG tablet Take 1 tablet (20 mg total) by mouth daily. 02/19/21   Gildardo Pounds, NP  ?metFORMIN (GLUCOPHAGE)  1000 MG tablet Take 1 tablet (1,000 mg total) by mouth 2 (two) times daily with a meal. ?Patient not taking: Reported on 05/26/2021 02/19/21   Gildardo Pounds, NP  ?oxyCODONE-acetaminophen (PERCOCET) 10-325 MG tablet Take 1 tablet by mouth every 4 (four) hours as needed for pain. 06/02/21   McKenzie, Candee Furbish, MD  ?tiZANidine (ZANAFLEX) 4 MG tablet Take 1 tablet (4 mg total) by mouth every 6 (six) hours as needed for muscle spasms. 03/08/21   Gildardo Pounds, NP  ?traZODone (DESYREL) 150 MG tablet Take 1 tablet (150 mg total) by mouth at bedtime as needed for sleep. 02/19/21 07/03/21  Gildardo Pounds, NP  ?   ? ?Allergies     ?Patient has no known allergies.   ? ?Review of Systems   ?Review of Systems  ?Constitutional:  Negative for chills and fever.  ?HENT:  Negative for ear pain and sore throat.   ?Eyes:  Negative for pain and visual disturbance.  ?Respiratory:  Negative for cough and shortness of breath.   ?Cardiovascular:  Negative for chest pain and palpitations.  ?Gastrointestinal:  Negative for abdominal pain and vomiting.  ?Genitourinary:  Positive for flank pain. Negative for dysuria and hematuria.  ?Musculoskeletal:  Negative for arthralgias and back pain.  ?Skin:  Negative for color change and rash.  ?Neurological:  Negative for seizures and syncope.  ?All other systems reviewed and are negative. ? ?Physical Exam ?Updated Vital Signs ?BP 125/74   Pulse 82   Temp 98.5 ?F (36.9 ?C) (Oral)   Resp 16   Ht 1.6 m ('5\' 3"' )   Wt 104.3 kg   SpO2 100%   BMI 40.74 kg/m?  ?Physical Exam ?Vitals and nursing note reviewed.  ?Constitutional:   ?   General: She is not in acute distress. ?   Appearance: Normal appearance. She is well-developed. She is not ill-appearing.  ?HENT:  ?   Head: Normocephalic and atraumatic.  ?Eyes:  ?   Extraocular Movements: Extraocular movements intact.  ?   Conjunctiva/sclera: Conjunctivae normal.  ?   Pupils: Pupils are equal, round, and reactive to light.  ?Cardiovascular:  ?   Rate and Rhythm: Normal rate and regular rhythm.  ?   Heart sounds: No murmur heard. ?Pulmonary:  ?   Effort: Pulmonary effort is normal. No respiratory distress.  ?   Breath sounds: Normal breath sounds.  ?Abdominal:  ?   General: There is no distension.  ?   Palpations: Abdomen is soft.  ?   Tenderness: There is no abdominal tenderness.  ?Musculoskeletal:     ?   General: No swelling.  ?   Cervical back: Normal range of motion and neck supple.  ?Skin: ?   General: Skin is warm and dry.  ?   Capillary Refill: Capillary refill takes less than 2 seconds.  ?Neurological:  ?   General: No focal deficit present.  ?   Mental Status:  She is alert and oriented to person, place, and time.  ?Psychiatric:     ?   Mood and Affect: Mood normal.  ? ? ?ED Results / Procedures / Treatments   ?Labs ?(all labs ordered are listed, but only abnormal results are displayed) ?Labs Reviewed  ?URINALYSIS, ROUTINE W REFLEX MICROSCOPIC - Abnormal; Notable for the following components:  ?    Result Value  ? Protein, ur TRACE (*)   ? Leukocytes,Ua MODERATE (*)   ? Bacteria, UA RARE (*)   ? Non Squamous Epithelial 0-5 (*)   ?  All other components within normal limits  ? ? ?EKG ?None ? ?Radiology ?CT Renal Stone Study ? ?Result Date: 06/03/2021 ?CLINICAL DATA:  Bilateral flank pain EXAM: CT ABDOMEN AND PELVIS WITHOUT CONTRAST TECHNIQUE: Multidetector CT imaging of the abdomen and pelvis was performed following the standard protocol without IV contrast. RADIATION DOSE REDUCTION: This exam was performed according to the departmental dose-optimization program which includes automated exposure control, adjustment of the mA and/or kV according to patient size and/or use of iterative reconstruction technique. COMPARISON:  CT abdomen and pelvis dated May 24, 2021 FINDINGS: Lower chest: No acute abnormality. Hepatobiliary: Hepatic steatosis. Gallbladder is unremarkable. No biliary ductal dilation. Pancreas: Unremarkable. No pancreatic ductal dilatation or surrounding inflammatory changes. Spleen: Prior splenectomy. Adrenals/Urinary Tract: Bilateral adrenal glands are unremarkable. Nonobstructing stone of the lower pole of the left kidney. No hydronephrosis. Bladder is unremarkable. Stomach/Bowel: Stomach is within normal limits. Appendix appears normal. No evidence of bowel wall thickening, distention, or inflammatory changes. Vascular/Lymphatic: Aortic atherosclerosis. No enlarged abdominal or pelvic lymph nodes. Reproductive: No adnexal masses. Other: Small fat containing umbilical hernia. No abdominopelvic ascites. Musculoskeletal: No acute or significant osseous findings.  IMPRESSION: 1. No acute findings in the abdomen or pelvis, including no evidence of obstructive uropathy. 2. Nonobstructing stone of the lower pole the left kidney. 3. Hepatic steatosis. 4.  Aortic Atheroscler

## 2021-06-03 NOTE — Discharge Instructions (Addendum)
Keep your follow-up with Dr. Alyson Ingles from urology.  Add on the additional some for pain control.  Continue your other medications.  Return for any new or worse symptoms.  Today's work-up without any acute findings. ?

## 2021-06-03 NOTE — ED Triage Notes (Signed)
Pt c/o bilateral flank pain, states she already has a known kidney stone in the left. Denies n/v/d or any urinary symptoms.  ?

## 2021-06-03 NOTE — ED Notes (Signed)
VO for dilaudid '1mg'$  and zofran '4mg'$  given per Dr. Gloris Manchester  ?

## 2021-06-03 NOTE — ED Notes (Signed)
Patient transported to CT 

## 2021-06-04 NOTE — Telephone Encounter (Signed)
Patient calling back to check on pain medication to be prescribed.  She says the weekend is coming up and needs pain Rx prescribed. ? ?Please advise. ? ?Thanks, ?Helene Kelp ?

## 2021-06-04 NOTE — Telephone Encounter (Signed)
Patient advised medication has been sent to pharmacy ?

## 2021-06-08 ENCOUNTER — Encounter (HOSPITAL_COMMUNITY)
Admission: RE | Admit: 2021-06-08 | Discharge: 2021-06-08 | Disposition: A | Discharge status: Home / Self Care | Payer: 59 | Source: Ambulatory Visit | Attending: Urology | Admitting: Urology

## 2021-06-08 ENCOUNTER — Other Ambulatory Visit: Payer: Self-pay

## 2021-06-08 ENCOUNTER — Encounter (HOSPITAL_COMMUNITY): Payer: Self-pay

## 2021-06-09 ENCOUNTER — Encounter (HOSPITAL_COMMUNITY)
Admission: RE | Disposition: A | Discharge status: Home / Self Care | Payer: Self-pay | Source: Ambulatory Visit | Attending: Urology

## 2021-06-09 ENCOUNTER — Ambulatory Visit (HOSPITAL_COMMUNITY)
Admission: RE | Admit: 2021-06-09 | Discharge: 2021-06-10 | Disposition: A | Discharge status: Home / Self Care | Payer: 59 | Source: Ambulatory Visit | Attending: Urology | Admitting: Urology

## 2021-06-09 ENCOUNTER — Ambulatory Visit (HOSPITAL_BASED_OUTPATIENT_CLINIC_OR_DEPARTMENT_OTHER): Payer: 59 | Admitting: Certified Registered Nurse Anesthetist

## 2021-06-09 ENCOUNTER — Ambulatory Visit (HOSPITAL_COMMUNITY): Payer: 59

## 2021-06-09 ENCOUNTER — Other Ambulatory Visit: Payer: Self-pay

## 2021-06-09 ENCOUNTER — Encounter (HOSPITAL_COMMUNITY): Payer: Self-pay | Admitting: Urology

## 2021-06-09 ENCOUNTER — Ambulatory Visit (HOSPITAL_COMMUNITY): Payer: 59 | Admitting: Certified Registered Nurse Anesthetist

## 2021-06-09 DIAGNOSIS — N202 Calculus of kidney with calculus of ureter: Secondary | ICD-10-CM | POA: Insufficient documentation

## 2021-06-09 DIAGNOSIS — J45909 Unspecified asthma, uncomplicated: Secondary | ICD-10-CM | POA: Insufficient documentation

## 2021-06-09 DIAGNOSIS — Z6841 Body Mass Index (BMI) 40.0 and over, adult: Secondary | ICD-10-CM | POA: Insufficient documentation

## 2021-06-09 DIAGNOSIS — I10 Essential (primary) hypertension: Secondary | ICD-10-CM

## 2021-06-09 DIAGNOSIS — Z7984 Long term (current) use of oral hypoglycemic drugs: Secondary | ICD-10-CM | POA: Diagnosis not present

## 2021-06-09 DIAGNOSIS — N200 Calculus of kidney: Secondary | ICD-10-CM | POA: Diagnosis present

## 2021-06-09 DIAGNOSIS — F418 Other specified anxiety disorders: Secondary | ICD-10-CM

## 2021-06-09 DIAGNOSIS — N1330 Unspecified hydronephrosis: Secondary | ICD-10-CM | POA: Diagnosis not present

## 2021-06-09 DIAGNOSIS — Z794 Long term (current) use of insulin: Secondary | ICD-10-CM | POA: Diagnosis not present

## 2021-06-09 DIAGNOSIS — N201 Calculus of ureter: Secondary | ICD-10-CM | POA: Diagnosis not present

## 2021-06-09 DIAGNOSIS — E119 Type 2 diabetes mellitus without complications: Secondary | ICD-10-CM | POA: Insufficient documentation

## 2021-06-09 HISTORY — PX: CYSTOSCOPY WITH RETROGRADE PYELOGRAM, URETEROSCOPY AND STENT PLACEMENT: SHX5789

## 2021-06-09 LAB — GLUCOSE, CAPILLARY: Glucose-Capillary: 174 mg/dL — ABNORMAL HIGH (ref 70–99)

## 2021-06-09 SURGERY — CYSTOURETEROSCOPY, WITH RETROGRADE PYELOGRAM AND STENT INSERTION
Anesthesia: General | Site: Bladder | Laterality: Left

## 2021-06-09 MED ORDER — PROPOFOL 10 MG/ML IV BOLUS
INTRAVENOUS | Status: AC
  Filled 2021-06-09: qty 20

## 2021-06-09 MED ORDER — MIDAZOLAM HCL 2 MG/2ML IJ SOLN
INTRAMUSCULAR | Status: AC
  Filled 2021-06-09: qty 2

## 2021-06-09 MED ORDER — FENTANYL CITRATE PF 50 MCG/ML IJ SOSY
PREFILLED_SYRINGE | INTRAMUSCULAR | Status: AC
  Filled 2021-06-09: qty 1

## 2021-06-09 MED ORDER — TAMSULOSIN HCL 0.4 MG PO CAPS
0.4000 mg | ORAL_CAPSULE | Freq: Every day | ORAL | 0 refills | Status: DC
Start: 2021-06-09 — End: 2021-08-02
  Filled 2021-06-09: qty 30, 30d supply, fill #0

## 2021-06-09 MED ORDER — ONDANSETRON HCL 4 MG PO TABS
4.0000 mg | ORAL_TABLET | Freq: Every day | ORAL | 1 refills | Status: AC | PRN
  Filled 2021-06-09: qty 21, 30d supply, fill #0
  Filled 2021-08-02: qty 21, 30d supply, fill #1

## 2021-06-09 MED ORDER — CEFAZOLIN SODIUM-DEXTROSE 2-4 GM/100ML-% IV SOLN
2.0000 g | INTRAVENOUS | Status: AC
  Administered 2021-06-09: 2 g via INTRAVENOUS

## 2021-06-09 MED ORDER — FENTANYL CITRATE PF 50 MCG/ML IJ SOSY: 50.0000 ug | PREFILLED_SYRINGE | Freq: Once | INTRAMUSCULAR | Status: AC

## 2021-06-09 MED ORDER — DIATRIZOATE MEGLUMINE 30 % UR SOLN
URETHRAL | Status: DC | PRN
  Administered 2021-06-09: 8 mL via URETHRAL

## 2021-06-09 MED ORDER — OXYCODONE-ACETAMINOPHEN 10-325 MG PO TABS
1.0000 | ORAL_TABLET | Freq: Three times a day (TID) | ORAL | 0 refills | Status: DC | PRN
  Filled 2021-06-09: qty 30, 10d supply, fill #0

## 2021-06-09 MED ORDER — SODIUM CHLORIDE 0.9 % IR SOLN
Status: DC | PRN
  Administered 2021-06-09 (×2): 3000 mL

## 2021-06-09 MED ORDER — ORAL CARE MOUTH RINSE: 15.0000 mL | Freq: Once | OROMUCOSAL | Status: AC

## 2021-06-09 MED ORDER — ONDANSETRON HCL 4 MG/2ML IJ SOLN
INTRAMUSCULAR | Status: DC | PRN
  Administered 2021-06-09: 4 mg via INTRAVENOUS

## 2021-06-09 MED ORDER — PHENYLEPHRINE 80 MCG/ML (10ML) SYRINGE FOR IV PUSH (FOR BLOOD PRESSURE SUPPORT)
PREFILLED_SYRINGE | INTRAVENOUS | Status: AC
  Filled 2021-06-09: qty 10

## 2021-06-09 MED ORDER — PROPOFOL 10 MG/ML IV BOLUS
INTRAVENOUS | Status: DC | PRN
  Administered 2021-06-09: 200 mg via INTRAVENOUS

## 2021-06-09 MED ORDER — FENTANYL CITRATE PF 50 MCG/ML IJ SOSY
25.0000 ug | PREFILLED_SYRINGE | INTRAMUSCULAR | Status: DC | PRN
  Administered 2021-06-09 (×2): 50 ug via INTRAVENOUS
  Filled 2021-06-09: qty 1

## 2021-06-09 MED ORDER — LACTATED RINGERS IV SOLN: INTRAVENOUS | Status: DC

## 2021-06-09 MED ORDER — ONDANSETRON HCL 4 MG/2ML IJ SOLN
INTRAMUSCULAR | Status: AC
  Filled 2021-06-09: qty 2

## 2021-06-09 MED ORDER — CEFAZOLIN SODIUM-DEXTROSE 2-4 GM/100ML-% IV SOLN
INTRAVENOUS | Status: AC
  Filled 2021-06-09: qty 100

## 2021-06-09 MED ORDER — PHENYLEPHRINE HCL (PRESSORS) 10 MG/ML IV SOLN
INTRAVENOUS | Status: DC | PRN
  Administered 2021-06-09 (×3): 80 ug via INTRAVENOUS

## 2021-06-09 MED ORDER — FENTANYL CITRATE (PF) 250 MCG/5ML IJ SOLN
INTRAMUSCULAR | Status: DC | PRN
  Administered 2021-06-09 (×2): 25 ug via INTRAVENOUS
  Administered 2021-06-09: 50 ug via INTRAVENOUS

## 2021-06-09 MED ORDER — FENTANYL CITRATE (PF) 100 MCG/2ML IJ SOLN
INTRAMUSCULAR | Status: AC
Start: 2021-06-09 — End: ?
  Filled 2021-06-09: qty 2

## 2021-06-09 MED ORDER — MIDAZOLAM HCL 2 MG/2ML IJ SOLN
2.0000 mg | Freq: Once | INTRAMUSCULAR | Status: AC
  Administered 2021-06-09: 2 mg via INTRAVENOUS

## 2021-06-09 MED ORDER — MIDAZOLAM HCL 2 MG/2ML IJ SOLN
INTRAMUSCULAR | Status: DC | PRN
  Administered 2021-06-09: 2 mg via INTRAVENOUS

## 2021-06-09 MED ORDER — CHLORHEXIDINE GLUCONATE 0.12 % MT SOLN
15.0000 mL | Freq: Once | OROMUCOSAL | Status: AC
  Administered 2021-06-09: 15 mL via OROMUCOSAL

## 2021-06-09 MED ORDER — FENTANYL CITRATE PF 50 MCG/ML IJ SOSY
PREFILLED_SYRINGE | INTRAMUSCULAR | Status: AC
  Administered 2021-06-09: 50 ug via INTRAVENOUS
  Filled 2021-06-09: qty 1

## 2021-06-09 MED ORDER — ONDANSETRON HCL 4 MG/2ML IJ SOLN: 4.0000 mg | Freq: Once | INTRAMUSCULAR | Status: DC | PRN

## 2021-06-09 MED ORDER — LIDOCAINE HCL (CARDIAC) PF 100 MG/5ML IV SOSY
PREFILLED_SYRINGE | INTRAVENOUS | Status: DC | PRN
  Administered 2021-06-09: 40 mg via INTRAVENOUS

## 2021-06-09 SURGICAL SUPPLY — 24 items
BAG DRAIN URO TABLE W/ADPT NS (BAG) ×3 IMPLANT
BAG DRN 8 ADPR NS SKTRN CSTL (BAG) ×2
BAG HAMPER (MISCELLANEOUS) ×3 IMPLANT
CATH INTERMIT  6FR 70CM (CATHETERS) ×3 IMPLANT
CLOTH BEACON ORANGE TIMEOUT ST (SAFETY) ×3 IMPLANT
DECANTER SPIKE VIAL GLASS SM (MISCELLANEOUS) ×3 IMPLANT
GLOVE BIO SURGEON STRL SZ8 (GLOVE) ×3 IMPLANT
GLOVE BIOGEL PI IND STRL 7.0 (GLOVE) ×4 IMPLANT
GLOVE BIOGEL PI INDICATOR 7.0 (GLOVE) ×2
GOWN STRL REUS W/TWL LRG LVL3 (GOWN DISPOSABLE) ×3 IMPLANT
GOWN STRL REUS W/TWL XL LVL3 (GOWN DISPOSABLE) ×3 IMPLANT
GUIDEWIRE STR DUAL SENSOR (WIRE) ×3 IMPLANT
GUIDEWIRE STR ZIPWIRE 035X150 (MISCELLANEOUS) ×3 IMPLANT
IV NS IRRIG 3000ML ARTHROMATIC (IV SOLUTION) ×6 IMPLANT
KIT TURNOVER CYSTO (KITS) ×3 IMPLANT
MANIFOLD NEPTUNE II (INSTRUMENTS) ×3 IMPLANT
PACK CYSTO (CUSTOM PROCEDURE TRAY) ×3 IMPLANT
PAD ARMBOARD 7.5X6 YLW CONV (MISCELLANEOUS) ×3 IMPLANT
SHEATH URETERAL 12FRX35CM (MISCELLANEOUS) ×2 IMPLANT
STENT URET 6FRX26 CONTOUR (STENTS) ×2 IMPLANT
SYR 10ML LL (SYRINGE) ×3 IMPLANT
SYR CONTROL 10ML LL (SYRINGE) ×3 IMPLANT
TOWEL OR 17X26 4PK STRL BLUE (TOWEL DISPOSABLE) ×3 IMPLANT
WATER STERILE IRR 500ML POUR (IV SOLUTION) ×3 IMPLANT

## 2021-06-09 NOTE — Transfer of Care (Signed)
Immediate Anesthesia Transfer of Care Note ? ?Patient: Jamie Conley ? ?Procedure(s) Performed: CYSTOSCOPY WITH RETROGRADE PYELOGRAM, URETEROSCOPY AND STENT PLACEMENT (Left: Bladder) ? ?Patient Location: PACU ? ?Anesthesia Type:General ? ?Level of Consciousness: awake and alert  ? ?Airway & Oxygen Therapy: Patient Spontanous Breathing and Patient connected to nasal cannula oxygen ? ?Post-op Assessment: Report given to RN and Post -op Vital signs reviewed and stable ? ?Post vital signs: Reviewed and stable ? ?Last Vitals:  ?Vitals Value Taken Time  ?BP 120/89   ?Temp 98.3   ?Pulse 78 06/09/21 1352  ?Resp 9 06/09/21 1352  ?SpO2 98 % 06/09/21 1352  ?Vitals shown include unvalidated device data. ? ?Last Pain:  ?Vitals:  ? 06/09/21 1300  ?PainSc: 6   ?   ? ?  ? ?Complications: No notable events documented. ?

## 2021-06-09 NOTE — Anesthesia Preprocedure Evaluation (Signed)
Anesthesia Evaluation  ?Patient identified by MRN, date of birth, ID band ?Patient awake ? ? ? ?Reviewed: ?Allergy & Precautions, H&P , NPO status , Patient's Chart, lab work & pertinent test results, reviewed documented beta blocker date and time  ? ?Airway ?Mallampati: II ? ?TM Distance: >3 FB ?Neck ROM: full ? ? ? Dental ?no notable dental hx. ? ?  ?Pulmonary ?asthma ,  ?  ?Pulmonary exam normal ?breath sounds clear to auscultation ? ? ? ? ? ? Cardiovascular ?Exercise Tolerance: Good ?hypertension, negative cardio ROS ? ? ?Rhythm:regular Rate:Normal ? ? ?  ?Neuro/Psych ? Headaches, PSYCHIATRIC DISORDERS Anxiety Depression  Neuromuscular disease   ? GI/Hepatic ?negative GI ROS, Neg liver ROS,   ?Endo/Other  ?diabetesMorbid obesity ? Renal/GU ?negative Renal ROS  ?negative genitourinary ?  ?Musculoskeletal ? ? Abdominal ?  ?Peds ? Hematology ?negative hematology ROS ?(+)   ?Anesthesia Other Findings ? ? Reproductive/Obstetrics ?negative OB ROS ? ?  ? ? ? ? ? ? ? ? ? ? ? ? ? ?  ?  ? ? ? ? ? ? ? ? ?Anesthesia Physical ?Anesthesia Plan ? ?ASA: 3 ? ?Anesthesia Plan: General and General LMA  ? ?Post-op Pain Management:   ? ?Induction:  ? ?PONV Risk Score and Plan:  ? ?Airway Management Planned:  ? ?Additional Equipment:  ? ?Intra-op Plan:  ? ?Post-operative Plan:  ? ?Informed Consent: I have reviewed the patients History and Physical, chart, labs and discussed the procedure including the risks, benefits and alternatives for the proposed anesthesia with the patient or authorized representative who has indicated his/her understanding and acceptance.  ? ? ? ?Dental Advisory Given ? ?Plan Discussed with: CRNA ? ?Anesthesia Plan Comments:   ? ? ? ? ? ? ?Anesthesia Quick Evaluation ? ?

## 2021-06-09 NOTE — Interval H&P Note (Signed)
History and Physical Interval Note: ? ?06/09/2021 ?11:43 AM ? ?Jamie Conley  has presented today for surgery, with the diagnosis of left renal calculus.  The various methods of treatment have been discussed with the patient and family. After consideration of risks, benefits and other options for treatment, the patient has consented to  Procedure(s): ?CYSTOSCOPY WITH RETROGRADE PYELOGRAM, URETEROSCOPY AND STENT PLACEMENT (Left) ?HOLMIUM LASER APPLICATION (Left) as a surgical intervention.  The patient's history has been reviewed, patient examined, no change in status, stable for surgery.  I have reviewed the patient's chart and labs.  Questions were answered to the patient's satisfaction.   ? ? ?Nicolette Bang ? ? ?

## 2021-06-09 NOTE — Anesthesia Procedure Notes (Signed)
Procedure Name: LMA Insertion ?Date/Time: 06/09/2021 1:21 PM ?Performed by: Karna Dupes, CRNA ?Pre-anesthesia Checklist: Emergency Drugs available, Patient identified, Suction available and Patient being monitored ?Patient Re-evaluated:Patient Re-evaluated prior to induction ?Oxygen Delivery Method: Circle system utilized ?Preoxygenation: Pre-oxygenation with 100% oxygen ?Induction Type: IV induction ?LMA: LMA inserted ?LMA Size: 4.0 ?Number of attempts: 1 ?Placement Confirmation: positive ETCO2 and breath sounds checked- equal and bilateral ?Tube secured with: Tape ?Dental Injury: Teeth and Oropharynx as per pre-operative assessment  ? ? ? ? ?

## 2021-06-09 NOTE — Op Note (Signed)
.  Preoperative diagnosis: Left renal stone ? ?Postoperative diagnosis: left UPJ calculus ? ?Procedure: 1 cystoscopy ?2. Left retrograde pyelography ?3.  Intraoperative fluoroscopy, under one hour, with interpretation ?4.  Left ureteroscopic stone manipulation with basket extraction ?5.  Left 6 x 26 JJ stent placement ? ?Attending: Rosie Fate ? ?Anesthesia: General ? ?Estimated blood loss: None ? ?Drains: Left 6 x 26 JJ ureteral stent without tether ? ?Specimens: stone for analysis ? ?Antibiotics: ancef ? ?Findings: left UPJ stone and lower pole stone. moderate hydronephrosis. No masses/lesions in the bladder. Ureteral orifices in normal anatomic location. ? ?Indications: Patient is a 46 year old female with a history of left renal stone and who has persistent left flank pain.  After discussing treatment options, she decided proceed with left ureteroscopic stone manipulation. ? ?Procedure in detail: The patient was brought to the operating room and a brief timeout was done to ensure correct patient, correct procedure, correct site.  General anesthesia was administered patient was placed in dorsal lithotomy position.  Her genitalia was then prepped and draped in usual sterile fashion.  A rigid 40 French cystoscope was passed in the urethra and the bladder.  Bladder was inspected free masses or lesions.  the ureteral orifices were in the normal orthotopic locations.  a 6 french ureteral catheter was then instilled into the left ureteral orifice.  a gentle retrograde was obtained and findings noted above.  we then placed a zip wire through the ureteral catheter and advanced up to the renal pelvis.  we then removed the cystoscope and cannulated the left ureteral orifice with a semirigid ureteroscope.  No stone was found in the ureter. Once we reached the UPJ a sensor wire was advanced in to the renal pelvis. We then removed the ureteroscope and advanced am 12/14 x 35cm access sheath up to the proximal ureter. We  then used the flexible ureteroscope to perform nephroscopy. We encountered a stone at the UPJ which was removed with an NGAGe backet. There was significant edema at the UPJ and we elected to place a stent at this time.  We then removed the access sheath under direct vision and noted no injury to the ureter. We then placed a 6 x 26 double-j ureteral stent over the original zip wire.  We then removed the wire and good coil was noted in the the renal pelvis under fluoroscopy and the bladder under direct vision. the bladder was then drained and this concluded the procedure which was well tolerated by patient. ? ?Complications: None ? ?Condition: Stable, extubated, transferred to PACU ? ?Plan: Patient is to be discharged home as to follow-up in 2-3 weeks for left ureteroscopic stone extraction ?

## 2021-06-10 NOTE — Anesthesia Postprocedure Evaluation (Signed)
Anesthesia Post Note ? ?Patient: Jamie Conley ? ?Procedure(s) Performed: CYSTOSCOPY WITH RETROGRADE PYELOGRAM, URETEROSCOPY AND STENT PLACEMENT (Left: Bladder) ? ?Patient location during evaluation: Phase II ?Anesthesia Type: General ?Level of consciousness: awake ?Pain management: pain level controlled ?Vital Signs Assessment: post-procedure vital signs reviewed and stable ?Respiratory status: spontaneous breathing and respiratory function stable ?Cardiovascular status: blood pressure returned to baseline and stable ?Postop Assessment: no headache and no apparent nausea or vomiting ?Anesthetic complications: no ?Comments: Late entry ? ? ?No notable events documented. ? ? ?Last Vitals:  ?Vitals:  ? 06/09/21 1400 06/09/21 1415  ?BP: 123/78 125/79  ?Pulse: 68 73  ?Resp: 11 17  ?Temp:    ?SpO2: 100% 100%  ?  ?Last Pain:  ?Vitals:  ? 06/09/21 1422  ?PainSc: 4   ? ? ?  ?  ?  ?  ?  ?  ? ?Louann Sjogren ? ? ? ? ?

## 2021-06-11 ENCOUNTER — Encounter (HOSPITAL_COMMUNITY): Payer: Self-pay | Admitting: Urology

## 2021-06-11 NOTE — Addendum Note (Signed)
Addendum  created 06/11/21 0932 by Ollen Bowl, CRNA  ? Intraprocedure Staff edited  ?  ?

## 2021-06-14 ENCOUNTER — Telehealth: Payer: Self-pay

## 2021-06-14 NOTE — Telephone Encounter (Signed)
Called patient and left message to return call to office to discuss surgery date.  ?

## 2021-06-15 ENCOUNTER — Ambulatory Visit (INDEPENDENT_AMBULATORY_CARE_PROVIDER_SITE_OTHER): Payer: 59 | Admitting: Urology

## 2021-06-15 ENCOUNTER — Encounter: Payer: Self-pay | Admitting: Urology

## 2021-06-15 ENCOUNTER — Ambulatory Visit: Payer: 59 | Admitting: Urology

## 2021-06-15 ENCOUNTER — Other Ambulatory Visit: Payer: Self-pay | Admitting: Nurse Practitioner

## 2021-06-15 ENCOUNTER — Other Ambulatory Visit: Payer: Self-pay

## 2021-06-15 ENCOUNTER — Other Ambulatory Visit: Payer: Self-pay | Admitting: Urology

## 2021-06-15 VITALS — BP 148/85 | HR 108 | Ht 63.0 in | Wt 235.0 lb

## 2021-06-15 DIAGNOSIS — E1165 Type 2 diabetes mellitus with hyperglycemia: Secondary | ICD-10-CM

## 2021-06-15 DIAGNOSIS — N200 Calculus of kidney: Secondary | ICD-10-CM | POA: Diagnosis not present

## 2021-06-15 DIAGNOSIS — Z794 Long term (current) use of insulin: Secondary | ICD-10-CM

## 2021-06-15 LAB — CALCULI, WITH PHOTOGRAPH (CLINICAL LAB)
Calcium Oxalate Dihydrate: 20 %
Calcium Oxalate Monohydrate: 80 %
Weight Calculi: <1 mg

## 2021-06-15 MED ORDER — MIRABEGRON ER 25 MG PO TB24: 25.0000 mg | ORAL_TABLET | Freq: Every day | ORAL | 0 refills | Status: DC

## 2021-06-15 MED ORDER — HYDROMORPHONE HCL 4 MG PO TABS
4.0000 mg | ORAL_TABLET | ORAL | 0 refills | Status: AC | PRN
  Filled 2021-06-15 (×2): qty 30, 5d supply, fill #0

## 2021-06-15 NOTE — Progress Notes (Signed)
? ?06/15/2021 ?11:26 AM  ? ?[place] ?05-09-1975 ?[id number] ? ?Referring provider: Gildardo Pounds, NP ?[address] ?[address] ?[place],  [place] [address] ? ? ?Left flank pain ? ?HPI: ?Jamie Conley is a 46yo here for followup for nephrolithiasis. She underwent left ureteral stent placement over 1 week ago and since then she has noted severe left flank pain not alleviated with narcotics. She has been to the ER for the pain. The pain is sharp, intermittent, moderate to severe and nonraditing. She has associated urinary urgency and frequency. No hematuria or dysuria. No other complaints today ? ? ?PMH: ?Past Medical History:  ?Diagnosis Date  ? Anxiety   ? Asthma   ? Cancer [facility])   ? Depression   ? Diabetes mellitus without complication ([place])   ? Enlarged liver   ? Fibromyalgia   ? High cholesterol   ? History of kidney stones   ? Hypertension   ? Migraine   ? ? ?Surgical History: ?Past Surgical History:  ?Procedure Laterality Date  ? ABDOMINAL HYSTERECTOMY    ? ANTERIOR AND POSTERIOR REPAIR    ? colon polyectomy    ? CYSTOSCOPY WITH RETROGRADE PYELOGRAM, URETEROSCOPY AND STENT PLACEMENT Left 06/09/2021  ? Procedure: CYSTOSCOPY WITH RETROGRADE PYELOGRAM, URETEROSCOPY AND STENT PLACEMENT;  Surgeon: Cleon Gustin, MD;  Location: [facility];  Service: Urology;  Laterality: Left;  ? CYSTOSCOPY/URETEROSCOPY/HOLMIUM LASER/STENT PLACEMENT Left 01/12/2021  ? Procedure: CYSTOSCOPY/URETEROSCOPY/HOLMIUM LASER/STENT PLACEMENT;  Surgeon: Vira Agar, MD;  Location: [facility];  Service: Urology;  Laterality: Left;  ? LEFT OOPHORECTOMY    ? LITHOTRIPSY    ? NASAL POLYP SURGERY    ? SPLENECTOMY    ? WRIST SURGERY Left   ? ? ?Home Medications:  ?Allergies as of 06/15/2021   ?No Known Allergies ?  ? ?  ?Medication List  ?  ? ?  ? Accurate as of Jun 15, 2021 11:26 AM. If you have any questions, ask your nurse or doctor.  ?  ?  ? ?  ? ?atorvastatin 40 MG tablet ?Commonly known as: LIPITOR ?Take 1 tablet (40 mg total) by  mouth daily. ?  ?blood glucose meter kit and supplies Kit ?Dispense based on patient and insurance preference. Use up to four times daily as directed. ?  ?busPIRone 15 MG tablet ?Commonly known as: BUSPAR ?Take 1 tablet (15 mg total) by mouth 2 (two) times daily. ?  ?carisoprodol 350 MG tablet ?Commonly known as: SOMA ?Take 1 tablet (350 mg total) by mouth 3 (three) times daily. ?  ?carisoprodol 350 MG tablet ?Commonly known as: SOMA ?Take 1 tablet (350 mg total) by mouth 3 (three) times daily. ?  ?cefUROXime 500 MG tablet ?Commonly known as: CEFTIN ?Take 1 tablet (500 mg total) by mouth 2 (two) times daily with a meal. ?  ?escitalopram 20 MG tablet ?Commonly known as: LEXAPRO ?Take 1 tablet (20 mg total) by mouth daily. ?  ?etodolac 300 MG capsule ?Commonly known as: LODINE ?Take 1 capsule (300 mg total) by mouth every 8 (eight) hours. ?  ?gabapentin 300 MG capsule ?Commonly known as: NEURONTIN ?Take 1 capsule (300 mg total) by mouth 3 (three) times daily. ?  ?insulin aspart protamine- aspart (70-30) 100 UNIT/ML injection ?Commonly known as: NOVOLOG MIX 70/30 ?Inject 20 Units into the skin 2 (two) times daily with a meal. ?  ?Insulin Pen Needle 31G X 5 MM Misc ?100 each by Does not apply route 2 (two) times daily. ?  ?Levemir  FlexPen 100 UNIT/ML FlexPen ?Generic drug: insulin detemir ?inject 20 units into the skin at bedtime ?  ?lisinopril 20 MG tablet ?Commonly known as: ZESTRIL ?Take 1 tablet (20 mg total) by mouth daily. ?  ?metFORMIN 1000 MG tablet ?Commonly known as: GLUCOPHAGE ?Take 1 tablet (1,000 mg total) by mouth 2 (two) times daily with a meal. ?  ?NovoLOG FlexPen 100 UNIT/ML FlexPen ?Generic drug: insulin aspart ?For blood sugars 0-150 give 0 units of insulin, 151-200 give 2 units of insulin, 201-250 give 4 units, 251-300 give 6 units, 301-350 give 8 units, 351-400 give 10 units,> 400 give 12 units and call M.D. ?Discussed hypoglycemia protocol. ?  ?ondansetron 4 MG tablet ?Commonly known as:  Zofran ?Take 1 tablet (4 mg total) by mouth daily as needed for nausea or vomiting. ?  ?oxyCODONE-acetaminophen 10-325 MG tablet ?Commonly known as: Percocet ?Take 1 tablet by mouth 3 (three) times daily as needed for pain. ?  ?tamsulosin 0.4 MG Caps capsule ?Commonly known as: Flomax ?Take 1 capsule (0.4 mg total) by mouth daily after supper. ?  ?tiZANidine 4 MG tablet ?Commonly known as: Zanaflex ?Take 1 tablet (4 mg total) by mouth every 6 (six) hours as needed for muscle spasms. ?  ?traZODone 150 MG tablet ?Commonly known as: DESYREL ?Take 1 tablet (150 mg total) by mouth at bedtime as needed for sleep. ?What changed: when to take this ?  ? ?  ? ? ?Allergies: No Known Allergies ? ?Family History: ?Family History  ?Problem Relation Age of Onset  ? Hypertension Mother   ? High Cholesterol Mother   ? Heart attack Father   ? ? ?Social History:  reports that she has never smoked. She has never used smokeless tobacco. She reports current drug use. She reports that she does not drink alcohol. ? ?ROS: ?All other review of systems were reviewed and are negative except what is noted above in HPI ? ?Physical Exam: ?BP (!) 148/85   Pulse (!) 108   Ht '5\' 3"'  (1.6 m)   Wt 235 lb (106.6 kg)   BMI 41.63 kg/m?   ?Constitutional:  Alert and oriented, No acute distress. ?HEENT: [place] AT, moist mucus membranes.  Trachea midline, no masses. ?Cardiovascular: No clubbing, cyanosis, or edema. ?Respiratory: Normal respiratory effort, no increased work of breathing. ?GI: Abdomen is soft, nontender, nondistended, no abdominal masses ?GU: No CVA tenderness.  ?Lymph: No cervical or inguinal lymphadenopathy. ?Skin: No rashes, bruises or suspicious lesions. ?Neurologic: Grossly intact, no focal deficits, moving all 4 extremities. ?Psychiatric: Normal mood and affect. ? ?Laboratory Data: ?Lab Results  ?Component Value Date  ? WBC 18.2 (H) 05/28/2021  ? HGB 12.6 05/28/2021  ? HCT 38.7 05/28/2021  ? MCV 91.3 05/28/2021  ? PLT 555 (H) 05/28/2021   ? ? ?Lab Results  ?Component Value Date  ? CREATININE 0.97 05/28/2021  ? ? ?No results found for: PSA ? ?No results found for: TESTOSTERONE ? ?Lab Results  ?Component Value Date  ? HGBA1C 11.4 (H) 12/25/2020  ? ? ?Urinalysis ?   ?Component Value Date/Time  ? [place] YELLOW 06/03/2021 1836  ? APPEARANCEUR CLEAR 06/03/2021 1836  ? APPEARANCEUR Clear 05/31/2021 0800  ? LABSPEC 1.027 06/03/2021 1836  ? PHURINE 6.0 06/03/2021 1836  ? GLUCOSEU NEGATIVE 06/03/2021 1836  ? [place] NEGATIVE 06/03/2021 1836  ? [place] NEGATIVE 06/03/2021 1836  ? BILIRUBINUR Negative 05/31/2021 0800  ? [place]ain NEGATIVE 06/03/2021 1836  ? PROTEINUR TRACE (A) 06/03/2021 1836  ? NITRITE NEGATIVE 06/03/2021 1836  ?  LEUKOCYTESUR MODERATE (A) 06/03/2021 1836  ? ? ?Lab Results  ?Component Value Date  ? LABMICR Comment 05/31/2021  ? BACTERIA RARE (A) 06/03/2021  ? ? ?Pertinent Imaging: ? ?No results found for this or any previous visit. ? ?No results found for this or any previous visit. ? ?No results found for this or any previous visit. ? ?No results found for this or any previous visit. ? ?No results found for this or any previous visit. ? ?No results found for this or any previous visit. ? ?No results found for this or any previous visit. ? ?Results for orders placed during the hospital encounter of 06/03/21 ? ?CT Renal Stone Study ? ?Narrative ?CLINICAL DATA:  Bilateral flank pain ? ?EXAM: ?CT ABDOMEN AND PELVIS WITHOUT CONTRAST ? ?TECHNIQUE: ?Multidetector CT imaging of the abdomen and pelvis was performed ?following the standard protocol without IV contrast. ? ?RADIATION DOSE REDUCTION: This exam was performed according to the ?departmental dose-optimization program which includes automated ?exposure control, adjustment of the mA and/or kV according to ?patient size and/or use of iterative reconstruction technique. ? ?COMPARISON:  CT abdomen and pelvis dated May 24, 2021 ? ?FINDINGS: ?Lower chest: No acute  abnormality. ? ?Hepatobiliary: Hepatic steatosis. Gallbladder is unremarkable. No ?biliary ductal dilation. ? ?Pancreas: Unremarkable. No pancreatic ductal dilatation or ?surrounding inflammatory changes. ? ?Spleen: Prior splenectomy. ? ?[place]

## 2021-06-15 NOTE — Patient Instructions (Signed)
Ureteroscopy ?Ureteroscopy is a procedure to check for and treat problems inside part of the urinary tract. In this procedure, a thin, flexible tube with a light at the end (ureteroscope) is used to look at the inside of the kidneys and the ureters. The ureters are the tubes that carry urine from the kidneys to the bladder. The ureteroscope is inserted into one or both of the ureters. ?You may need this procedure if you have frequent urinary tract infections (UTIs), blood in your urine, or a stone in one of your ureters. A ureteroscopy can be done: ?To find the cause of urine blockage in a ureter and to evaluate other abnormalities inside the ureters or kidneys. ?To remove stones. ?To remove or treat growths of tissue (polyps), abnormal tissue, and some types of tumors. ?To remove a tissue sample and check it for disease under a microscope (biopsy). ?Tell a health care provider about: ?Any allergies you have. ?All medicines you are taking, including vitamins, herbs, eye drops, creams, and over-the-counter medicines. ?Any problems you or family members have had with anesthetic medicines. ?Any blood disorders you have. ?Any surgeries you have had. ?Any medical conditions you have. ?Whether you are pregnant or may be pregnant. ?What are the risks? ?Generally, this is a safe procedure. However, problems may occur, including: ?Bleeding. ?Infection. ?Allergic reactions to medicines. ?Scarring that narrows the ureter (stricture). ?Creating a hole in the ureter (perforation). ?What happens before the procedure? ?Staying hydrated ?Follow instructions from your health care provider about hydration, which may include: ?Up to 2 hours before the procedure - you may continue to drink clear liquids, such as water, clear fruit juice, black coffee, and plain tea. ? ?Eating and drinking restrictions ?Follow instructions from your health care provider about eating and drinking, which may include: ?8 hours before the procedure - stop  eating heavy meals or foods, such as meat, fried foods, or fatty foods. ?6 hours before the procedure - stop eating light meals or foods, such as toast or cereal. ?6 hours before the procedure - stop drinking milk or drinks that contain milk. ?2 hours before the procedure - stop drinking clear liquids. ?Medicines ?Ask your health care provider about: ?Changing or stopping your regular medicines. This is especially important if you are taking diabetes medicines or blood thinners. ?Taking medicines such as aspirin and ibuprofen. These medicines can thin your blood. Do not take these medicines unless your health care provider tells you to take them. ?Taking over-the-counter medicines, vitamins, herbs, and supplements. ?General instructions ?Do not use any products that contain nicotine or tobacco for at least 4 weeks before the procedure. These products include cigarettes, e-cigarettes, and chewing tobacco. If you need help quitting, ask your health care provider. ?You may have a urine sample taken to check for infection. ?Plan to have someone take you home from the hospital or clinic. ?If you will be going home right after the procedure, plan to have someone with you for 24 hours. ?Ask your health care provider what steps will be taken to help prevent infection. These may include: ?Washing skin with a germ-killing soap. ?Receiving antibiotic medicine. ?What happens during the procedure? ? ?An IV will be inserted into one of your veins. ?You will be given one or more of the following: ?A medicine to help you relax (sedative). ?A medicine to make you fall asleep (general anesthetic). ?A medicine that is injected into your spine to numb the area below and slightly above the injection site (spinal anesthetic). ?The  part of your body that drains urine from your bladder (urethra) will be cleaned with a germ-killing solution. ?The ureteroscope will be passed through your urethra into your bladder. ?A salt-water solution will  be sent through the ureteroscope to fill your bladder. This will help the health care provider see the openings of your ureters more clearly. ?The ureteroscope will be passed into your ureter. ?If a growth is found, a biopsy may be done. ?If a stone is found, it may be removed through the ureteroscope, or the stone may be broken up using a laser, shock waves, or electrical energy. ?In some cases, if the ureter is too small, a tube may be inserted that keeps the ureter open (ureteral stent). The stent may be left in place for 1 or 2 weeks to keep the ureter open, and then the ureteroscopy procedure will be done. ?The scope will be removed, and your bladder will be emptied. ?The procedure may vary among health care providers and hospitals. ?What can I expect after the procedure? ?After your procedure, it is common to have: ?Your blood pressure, heart rate, breathing rate, and blood oxygen level monitored until you leave the hospital or clinic. ?A burning sensation when you urinate. You may be asked to urinate. ?Blood in your urine. ?Mild discomfort in your bladder area or kidney area when urinating. ?A need to urinate more often or urgently. ?Follow these instructions at home: ?Medicines ?Take over-the-counter and prescription medicines only as told by your health care provider. ?If you were prescribed an antibiotic medicine, take it as told by your health care provider. Do not stop taking the antibiotic even if you start to feel better. ?General instructions ? ?If you were given a sedative during the procedure, it can affect you for several hours. Do not drive or operate machinery until your health care provider says that it is safe. ?To relieve burning, take a warm bath or hold a warm washcloth over your groin. ?Drink enough fluid to keep your urine pale yellow. ?Drink two 8-ounce (237 mL) glasses of water every hour for the first 2 hours after you get home. ?Continue to drink water often at home. ?You can eat what  you normally do. ?Keep all follow-up visits as told by your health care provider. This is important. ?If you had a ureteral stent placed, ask your health care provider when you need to return to have it removed. ?Contact a health care provider if you have: ?Chills or a fever. ?Burning pain for longer than 24 hours after the procedure. ?Blood in your urine for longer than 24 hours after the procedure. ?Get help right away if you have: ?Large amounts of blood in your urine. ?Blood clots in your urine. ?Severe pain. ?Chest pain or trouble breathing. ?The feeling of a full bladder and you are unable to urinate. ?These symptoms may represent a serious problem that is an emergency. Do not wait to see if the symptoms will go away. Get medical help right away. Call your local emergency services (911 in the [place] ?Summary ?Ureteroscopy is a procedure to check for and treat problems inside part of the urinary tract. ?In this procedure, a thin, flexible tube with a light at the end (ureteroscope) is used to look at the inside of the kidneys and the ureters. ?You may need this procedure if you have frequent urinary tract infections (UTIs), blood in your urine, or a stone in a ureter. ?This information is not intended to replace advice given to  you by your health care provider. Make sure you discuss any questions you have with your health care provider. ?Document Revised: 12/24/2020 Document Reviewed: 10/31/2018 ?Elsevier Patient Education ? [place]. ? ?

## 2021-06-16 ENCOUNTER — Other Ambulatory Visit: Payer: Self-pay

## 2021-06-16 ENCOUNTER — Emergency Department (HOSPITAL_COMMUNITY): Payer: 59

## 2021-06-16 ENCOUNTER — Inpatient Hospital Stay (HOSPITAL_COMMUNITY)
Admission: EM | Admit: 2021-06-16 | Discharge: 2021-06-23 | DRG: 854 | Disposition: A | Discharge status: Home / Self Care | Payer: 59 | Attending: Internal Medicine | Admitting: Internal Medicine

## 2021-06-16 ENCOUNTER — Encounter (HOSPITAL_COMMUNITY): Payer: Self-pay

## 2021-06-16 ENCOUNTER — Ambulatory Visit: Payer: 59 | Admitting: Urology

## 2021-06-16 ENCOUNTER — Telehealth: Payer: Self-pay

## 2021-06-16 DIAGNOSIS — E119 Type 2 diabetes mellitus without complications: Secondary | ICD-10-CM | POA: Diagnosis not present

## 2021-06-16 DIAGNOSIS — M797 Fibromyalgia: Secondary | ICD-10-CM | POA: Diagnosis present

## 2021-06-16 DIAGNOSIS — N179 Acute kidney failure, unspecified: Secondary | ICD-10-CM | POA: Diagnosis present

## 2021-06-16 DIAGNOSIS — E1169 Type 2 diabetes mellitus with other specified complication: Secondary | ICD-10-CM | POA: Diagnosis present

## 2021-06-16 DIAGNOSIS — F32A Depression, unspecified: Secondary | ICD-10-CM | POA: Diagnosis present

## 2021-06-16 DIAGNOSIS — R160 Hepatomegaly, not elsewhere classified: Secondary | ICD-10-CM | POA: Diagnosis present

## 2021-06-16 DIAGNOSIS — Z6841 Body Mass Index (BMI) 40.0 and over, adult: Secondary | ICD-10-CM

## 2021-06-16 DIAGNOSIS — Z7984 Long term (current) use of oral hypoglycemic drugs: Secondary | ICD-10-CM | POA: Diagnosis not present

## 2021-06-16 DIAGNOSIS — N200 Calculus of kidney: Secondary | ICD-10-CM | POA: Diagnosis not present

## 2021-06-16 DIAGNOSIS — R21 Rash and other nonspecific skin eruption: Secondary | ICD-10-CM | POA: Diagnosis present

## 2021-06-16 DIAGNOSIS — E785 Hyperlipidemia, unspecified: Secondary | ICD-10-CM | POA: Diagnosis not present

## 2021-06-16 DIAGNOSIS — Z960 Presence of urogenital implants: Secondary | ICD-10-CM | POA: Diagnosis present

## 2021-06-16 DIAGNOSIS — N12 Tubulo-interstitial nephritis, not specified as acute or chronic: Secondary | ICD-10-CM | POA: Diagnosis present

## 2021-06-16 DIAGNOSIS — Z794 Long term (current) use of insulin: Secondary | ICD-10-CM

## 2021-06-16 DIAGNOSIS — L98491 Non-pressure chronic ulcer of skin of other sites limited to breakdown of skin: Secondary | ICD-10-CM | POA: Diagnosis present

## 2021-06-16 DIAGNOSIS — Z7951 Long term (current) use of inhaled steroids: Secondary | ICD-10-CM

## 2021-06-16 DIAGNOSIS — K760 Fatty (change of) liver, not elsewhere classified: Secondary | ICD-10-CM | POA: Diagnosis present

## 2021-06-16 DIAGNOSIS — Z9081 Acquired absence of spleen: Secondary | ICD-10-CM

## 2021-06-16 DIAGNOSIS — N10 Acute tubulo-interstitial nephritis: Secondary | ICD-10-CM | POA: Diagnosis present

## 2021-06-16 DIAGNOSIS — N202 Calculus of kidney with calculus of ureter: Secondary | ICD-10-CM | POA: Diagnosis present

## 2021-06-16 DIAGNOSIS — L98499 Non-pressure chronic ulcer of skin of other sites with unspecified severity: Secondary | ICD-10-CM | POA: Diagnosis not present

## 2021-06-16 DIAGNOSIS — Z90710 Acquired absence of both cervix and uterus: Secondary | ICD-10-CM

## 2021-06-16 DIAGNOSIS — I10 Essential (primary) hypertension: Secondary | ICD-10-CM | POA: Diagnosis present

## 2021-06-16 DIAGNOSIS — B9620 Unspecified Escherichia coli [E. coli] as the cause of diseases classified elsewhere: Secondary | ICD-10-CM | POA: Diagnosis present

## 2021-06-16 DIAGNOSIS — E7800 Pure hypercholesterolemia, unspecified: Secondary | ICD-10-CM | POA: Diagnosis present

## 2021-06-16 DIAGNOSIS — A419 Sepsis, unspecified organism: Principal | ICD-10-CM | POA: Diagnosis present

## 2021-06-16 DIAGNOSIS — N201 Calculus of ureter: Secondary | ICD-10-CM | POA: Diagnosis not present

## 2021-06-16 DIAGNOSIS — G43909 Migraine, unspecified, not intractable, without status migrainosus: Secondary | ICD-10-CM | POA: Diagnosis present

## 2021-06-16 DIAGNOSIS — F419 Anxiety disorder, unspecified: Secondary | ICD-10-CM | POA: Diagnosis present

## 2021-06-16 DIAGNOSIS — Z8249 Family history of ischemic heart disease and other diseases of the circulatory system: Secondary | ICD-10-CM

## 2021-06-16 DIAGNOSIS — Z87442 Personal history of urinary calculi: Secondary | ICD-10-CM | POA: Diagnosis not present

## 2021-06-16 DIAGNOSIS — Z7985 Long-term (current) use of injectable non-insulin antidiabetic drugs: Secondary | ICD-10-CM | POA: Diagnosis not present

## 2021-06-16 DIAGNOSIS — Z79899 Other long term (current) drug therapy: Secondary | ICD-10-CM

## 2021-06-16 DIAGNOSIS — D649 Anemia, unspecified: Secondary | ICD-10-CM | POA: Diagnosis present

## 2021-06-16 DIAGNOSIS — R109 Unspecified abdominal pain: Secondary | ICD-10-CM | POA: Diagnosis not present

## 2021-06-16 LAB — BASIC METABOLIC PANEL
Anion gap: 10 (ref 5–15)
BUN: 28 mg/dL — ABNORMAL HIGH (ref 6–20)
CO2: 23 mmol/L (ref 22–32)
Calcium: 9.2 mg/dL (ref 8.9–10.3)
Chloride: 109 mmol/L (ref 98–111)
Creatinine, Ser: 1.31 mg/dL — ABNORMAL HIGH (ref 0.44–1.00)
GFR, Estimated: 51 mL/min — ABNORMAL LOW (ref >60)
Glucose, Bld: 177 mg/dL — ABNORMAL HIGH (ref 70–99)
Potassium: 4.2 mmol/L (ref 3.5–5.1)
Sodium: 142 mmol/L (ref 135–145)

## 2021-06-16 LAB — URINALYSIS, ROUTINE W REFLEX MICROSCOPIC
Bilirubin Urine: NEGATIVE
Glucose, UA: NEGATIVE mg/dL
Ketones, ur: NEGATIVE mg/dL
Nitrite: POSITIVE — AB
Protein, ur: 100 mg/dL — AB
RBC / HPF: >50 RBC/hpf — ABNORMAL HIGH (ref 0–5)
Specific Gravity, Urine: 1.015 (ref 1.005–1.030)
pH: 6 (ref 5.0–8.0)

## 2021-06-16 LAB — CBC
HCT: 36.8 % (ref 36.0–46.0)
Hemoglobin: 11.6 g/dL — ABNORMAL LOW (ref 12.0–15.0)
MCH: 30.3 pg (ref 26.0–34.0)
MCHC: 31.5 g/dL (ref 30.0–36.0)
MCV: 96.1 fL (ref 80.0–100.0)
Platelets: 536 10*3/uL — ABNORMAL HIGH (ref 150–400)
RBC: 3.83 MIL/uL — ABNORMAL LOW (ref 3.87–5.11)
RDW: 17.2 % — ABNORMAL HIGH (ref 11.5–15.5)
WBC: 14.7 10*3/uL — ABNORMAL HIGH (ref 4.0–10.5)
nRBC: 0 % (ref 0.0–0.2)

## 2021-06-16 LAB — GLUCOSE, CAPILLARY: Glucose-Capillary: 160 mg/dL — ABNORMAL HIGH (ref 70–99)

## 2021-06-16 MED ORDER — HYDROMORPHONE HCL 1 MG/ML IJ SOLN
1.0000 mg | Freq: Once | INTRAMUSCULAR | Status: AC
  Administered 2021-06-16: 1 mg via INTRAVENOUS
  Filled 2021-06-16: qty 1

## 2021-06-16 MED ORDER — ATORVASTATIN CALCIUM 40 MG PO TABS
40.0000 mg | ORAL_TABLET | Freq: Every day | ORAL | Status: DC
  Administered 2021-06-17 – 2021-06-23 (×7): 40 mg via ORAL
  Filled 2021-06-16 (×7): qty 1

## 2021-06-16 MED ORDER — HYDROMORPHONE HCL 1 MG/ML IJ SOLN
1.0000 mg | INTRAMUSCULAR | Status: DC | PRN
  Administered 2021-06-17 (×2): 1 mg via INTRAVENOUS
  Filled 2021-06-16 (×2): qty 1

## 2021-06-16 MED ORDER — SODIUM CHLORIDE 0.9 % IV SOLN
1.0000 g | INTRAVENOUS | Status: DC
  Administered 2021-06-17 – 2021-06-22 (×6): 1 g via INTRAVENOUS
  Filled 2021-06-16 (×6): qty 10

## 2021-06-16 MED ORDER — TAMSULOSIN HCL 0.4 MG PO CAPS
0.4000 mg | ORAL_CAPSULE | Freq: Every day | ORAL | Status: DC
  Administered 2021-06-17 – 2021-06-22 (×6): 0.4 mg via ORAL
  Filled 2021-06-16 (×6): qty 1

## 2021-06-16 MED ORDER — INSULIN ASPART 100 UNIT/ML IJ SOLN
0.0000 [IU] | Freq: Three times a day (TID) | INTRAMUSCULAR | Status: DC
  Administered 2021-06-17: 1 [IU] via SUBCUTANEOUS
  Administered 2021-06-17 – 2021-06-20 (×9): 2 [IU] via SUBCUTANEOUS
  Administered 2021-06-20 (×2): 3 [IU] via SUBCUTANEOUS
  Administered 2021-06-21: 2 [IU] via SUBCUTANEOUS
  Administered 2021-06-21 – 2021-06-22 (×3): 3 [IU] via SUBCUTANEOUS
  Administered 2021-06-23: 2 [IU] via SUBCUTANEOUS

## 2021-06-16 MED ORDER — GABAPENTIN 300 MG PO CAPS
600.0000 mg | ORAL_CAPSULE | Freq: Every morning | ORAL | Status: DC
Start: 2021-06-17 — End: 2021-06-23
  Administered 2021-06-17 – 2021-06-23 (×6): 600 mg via ORAL
  Filled 2021-06-16 (×7): qty 2

## 2021-06-16 MED ORDER — GABAPENTIN 300 MG PO CAPS
300.0000 mg | ORAL_CAPSULE | Freq: Every evening | ORAL | Status: DC
  Administered 2021-06-17 – 2021-06-22 (×6): 300 mg via ORAL
  Filled 2021-06-16 (×5): qty 1

## 2021-06-16 MED ORDER — KETOROLAC TROMETHAMINE 30 MG/ML IJ SOLN
30.0000 mg | Freq: Once | INTRAMUSCULAR | Status: AC
  Administered 2021-06-16: 30 mg via INTRAVENOUS
  Filled 2021-06-16: qty 1

## 2021-06-16 MED ORDER — ACETAMINOPHEN 650 MG RE SUPP: 650.0000 mg | Freq: Four times a day (QID) | RECTAL | Status: DC | PRN

## 2021-06-16 MED ORDER — SODIUM CHLORIDE 0.9 % IV SOLN
1.0000 g | Freq: Once | INTRAVENOUS | Status: AC
  Administered 2021-06-16: 1 g via INTRAVENOUS
  Filled 2021-06-16: qty 10

## 2021-06-16 MED ORDER — ESCITALOPRAM OXALATE 10 MG PO TABS
20.0000 mg | ORAL_TABLET | Freq: Every day | ORAL | Status: DC
  Administered 2021-06-17 – 2021-06-23 (×7): 20 mg via ORAL
  Filled 2021-06-16 (×7): qty 2

## 2021-06-16 MED ORDER — TRAZODONE HCL 50 MG PO TABS
150.0000 mg | ORAL_TABLET | Freq: Every day | ORAL | Status: DC
  Administered 2021-06-16 – 2021-06-22 (×7): 150 mg via ORAL
  Filled 2021-06-16 (×7): qty 3

## 2021-06-16 MED ORDER — ENOXAPARIN SODIUM 40 MG/0.4ML IJ SOSY
40.0000 mg | PREFILLED_SYRINGE | Freq: Every day | INTRAMUSCULAR | Status: DC
  Administered 2021-06-16 – 2021-06-22 (×7): 40 mg via SUBCUTANEOUS
  Filled 2021-06-16 (×7): qty 0.4

## 2021-06-16 MED ORDER — INSULIN DETEMIR 100 UNIT/ML ~~LOC~~ SOLN
10.0000 [IU] | Freq: Every day | SUBCUTANEOUS | Status: DC
  Administered 2021-06-16 – 2021-06-22 (×7): 10 [IU] via SUBCUTANEOUS
  Filled 2021-06-16 (×8): qty 0.1

## 2021-06-16 MED ORDER — SODIUM CHLORIDE 0.9 % IV SOLN
INTRAVENOUS | Status: DC
  Administered 2021-06-19: 50 mL/h via INTRAVENOUS

## 2021-06-16 MED ORDER — SODIUM CHLORIDE 0.9 % IV BOLUS
1000.0000 mL | Freq: Once | INTRAVENOUS | Status: AC
  Administered 2021-06-16: 1000 mL via INTRAVENOUS

## 2021-06-16 MED ORDER — DIAZEPAM 5 MG PO TABS
10.0000 mg | ORAL_TABLET | Freq: Once | ORAL | Status: AC
Start: 2021-06-16 — End: 2021-06-16
  Administered 2021-06-16: 10 mg via ORAL
  Filled 2021-06-16: qty 2

## 2021-06-16 MED ORDER — BACITRACIN-NEOMYCIN-POLYMYXIN OINTMENT TUBE
TOPICAL_OINTMENT | Freq: Two times a day (BID) | CUTANEOUS | Status: DC
  Filled 2021-06-16: qty 14.17

## 2021-06-16 MED ORDER — DIPHENHYDRAMINE HCL 25 MG PO CAPS
25.0000 mg | ORAL_CAPSULE | Freq: Four times a day (QID) | ORAL | Status: DC | PRN
  Administered 2021-06-18 – 2021-06-22 (×3): 25 mg via ORAL
  Filled 2021-06-16 (×4): qty 1

## 2021-06-16 MED ORDER — ACETAMINOPHEN 325 MG PO TABS: 650.0000 mg | ORAL_TABLET | Freq: Four times a day (QID) | ORAL | Status: DC | PRN

## 2021-06-16 MED ORDER — PANTOPRAZOLE SODIUM 40 MG PO TBEC
40.0000 mg | DELAYED_RELEASE_TABLET | Freq: Every day | ORAL | Status: DC
  Administered 2021-06-17 – 2021-06-23 (×7): 40 mg via ORAL
  Filled 2021-06-16 (×7): qty 1

## 2021-06-16 MED ORDER — OXYCODONE HCL 5 MG PO TABS
5.0000 mg | ORAL_TABLET | ORAL | Status: DC | PRN
Start: 2021-06-16 — End: 2021-06-18
  Administered 2021-06-17 (×2): 5 mg via ORAL
  Filled 2021-06-16 (×2): qty 1

## 2021-06-16 MED ORDER — ONDANSETRON HCL 4 MG/2ML IJ SOLN: 4.0000 mg | Freq: Four times a day (QID) | INTRAMUSCULAR | Status: DC | PRN

## 2021-06-16 MED ORDER — TRULICITY 0.75 MG/0.5ML ~~LOC~~ SOAJ
0.7500 mg | SUBCUTANEOUS | 0 refills | Status: DC
  Filled 2021-06-16 – 2021-06-23 (×2): qty 2, 28d supply, fill #0

## 2021-06-16 MED ORDER — ONDANSETRON HCL 4 MG PO TABS: 4.0000 mg | ORAL_TABLET | Freq: Four times a day (QID) | ORAL | Status: DC | PRN

## 2021-06-16 MED ORDER — POLYETHYLENE GLYCOL 3350 17 G PO PACK
17.0000 g | PACK | Freq: Every day | ORAL | Status: DC
  Filled 2021-06-16 (×6): qty 1

## 2021-06-16 MED ORDER — BUSPIRONE HCL 5 MG PO TABS
15.0000 mg | ORAL_TABLET | Freq: Two times a day (BID) | ORAL | Status: DC
  Administered 2021-06-16 – 2021-06-23 (×13): 15 mg via ORAL
  Filled 2021-06-16 (×14): qty 3

## 2021-06-16 NOTE — Assessment & Plan Note (Addendum)
Patient with tachycardia and leukocytosis consistent with sepsis (present on admission). ? ?Plan to continue volume repletion with isotonic saline at 100 ml per hr.  ?Antibiotic therapy with IV ceftriaxone ?Follow up cultures and temperature curve ?Follow up with urology recommendations, she will be seen in am by the Urology team.  ?Continue pain control with hydromorphone 1 mg IV q 4 hrs as needed for severe pain and oxycodone for moderate pain.  ?Continue with antiemetics and antiacids.  ?Resume tamsulosin.  ?

## 2021-06-16 NOTE — Assessment & Plan Note (Signed)
Calculated BMI is 42,2  ?

## 2021-06-16 NOTE — Telephone Encounter (Signed)
Patient called stating that the pain medication prescribed was not helping. She has decreased urine output and is in severe pain. Patient states she is going to ER. ?

## 2021-06-16 NOTE — Assessment & Plan Note (Addendum)
Depression  ?Continue pain control and supportive medical care ?On buspirone, escitalopram, gabapentin and trazodone.  ?

## 2021-06-16 NOTE — ED Provider Notes (Signed)
[facility] EMERGENCY DEPARTMENT Provider Note   CSN: [id number] Arrival date & time: 06/16/21  1504     History  Chief Complaint  Patient presents with   Flank Pain    Jamie Conley is a 46 y.o. female.  The history is provided by medical records.  Flank Pain   Patient with medical history notable for type 2 diabetes, hypertension, migraines, history of frequent kidney stones (patient says she has had 26 stents) presents today due to left flank pain.  She states she has been having pain like this since May 3 when she had a stent placed due to most recent kidney stone.  The pain is constant, associated with nausea and emesis most recently today.  She is also having dysuria, stage  Seen in office yesterday by Dr. Alyson Ingles with urology, scheduled for ureteroscopy in June.  Patient given 4 mg of Dilaudid but states the pain is too severe and she is unable to tolerate having the "stent in her any longer".   Home Medications Prior to Admission medications   Medication Sig Start Date End Date Taking? Authorizing Provider  atorvastatin (LIPITOR) 40 MG tablet Take 1 tablet (40 mg total) by mouth daily. 01/01/21  Yes Amin, Ankit Chirag, MD  busPIRone (BUSPAR) 15 MG tablet Take 1 tablet (15 mg total) by mouth 2 (two) times daily. 02/19/21  Yes Gildardo Pounds, NP  diphenhydrAMINE (BENADRYL) 25 MG tablet Take 25 mg by mouth every 6 (six) hours as needed for allergies.   Yes [provider]  Dulaglutide (TRULICITY) 9.75 PY/0.5RT SOPN Inject 0.75 mg into the skin once a week. 06/16/21  Yes Newlin, Charlane Ferretti, MD  escitalopram (LEXAPRO) 20 MG tablet Take 1 tablet (20 mg total) by mouth daily. 02/19/21 07/03/21 Yes Gildardo Pounds, NP  gabapentin (NEURONTIN) 300 MG capsule Take 1 capsule (300 mg total) by mouth 3 (three) times daily. Patient taking differently: Take 300 mg by mouth See admin instructions. Take 600 mg in the morning 300 mg in the evening 02/19/21 06/16/21 Yes Gildardo Pounds, NP  HYDROmorphone (DILAUDID) 4 MG tablet Take 1 tablet (4 mg total) by mouth every 4 (four) hours as needed for severe pain. 06/15/21  Yes McKenzie, Candee Furbish, MD  insulin aspart (NOVOLOG FLEXPEN) 100 UNIT/ML FlexPen For blood sugars 0-150 give 0 units of insulin, 151-200 give 2 units of insulin, 201-250 give 4 units, 251-300 give 6 units, 301-350 give 8 units, 351-400 give 10 units,> 400 give 12 units and call M.D. Discussed hypoglycemia protocol. 02/19/21  Yes Gildardo Pounds, NP  insulin aspart protamine- aspart (NOVOLOG MIX 70/30) (70-30) 100 UNIT/ML injection Inject 20 Units into the skin 2 (two) times daily with a meal.   Yes [provider]  insulin detemir (LEVEMIR) 100 UNIT/ML FlexPen inject 20 units into the skin at bedtime 02/19/21  Yes   lisinopril (ZESTRIL) 20 MG tablet Take 1 tablet (20 mg total) by mouth daily. 02/19/21  Yes Gildardo Pounds, NP  metFORMIN (GLUCOPHAGE) 1000 MG tablet Take 1 tablet (1,000 mg total) by mouth 2 (two) times daily with a meal. 02/19/21  Yes Gildardo Pounds, NP  ondansetron (ZOFRAN) 4 MG tablet Take 1 tablet (4 mg total) by mouth daily as needed for nausea or vomiting. 06/09/21 06/09/22 Yes McKenzie, Candee Furbish, MD  tamsulosin (FLOMAX) 0.4 MG CAPS capsule Take 1 capsule (0.4 mg total) by mouth daily after supper. 06/09/21  Yes McKenzie, Candee Furbish, MD  traZODone (DESYREL) 150 MG  tablet Take 1 tablet (150 mg total) by mouth at bedtime as needed for sleep. Patient taking differently: Take 150 mg by mouth at bedtime. 02/19/21 07/03/21 Yes Gildardo Pounds, NP  blood glucose meter kit and supplies KIT Dispense based on patient and insurance preference. Use up to four times daily as directed. 01/01/21   Amin, Jeanella Flattery, MD  carisoprodol (SOMA) 350 MG tablet Take 1 tablet (350 mg total) by mouth 3 (three) times daily. Patient not taking: Reported on 06/16/2021 05/28/21   Carlisle Cater, PA-C  carisoprodol (SOMA) 350 MG tablet Take 1 tablet (350 mg total) by  mouth 3 (three) times daily. Patient not taking: Reported on 06/16/2021 06/03/21   Fredia Sorrow, MD  cefUROXime (CEFTIN) 500 MG tablet Take 1 tablet (500 mg total) by mouth 2 (two) times daily with a meal. Patient not taking: Reported on 06/16/2021 05/26/21   Cleon Gustin, MD  etodolac (LODINE) 300 MG capsule Take 1 capsule (300 mg total) by mouth every 8 (eight) hours. Patient not taking: Reported on 06/16/2021 03/17/21   Dorie Rank, MD  Insulin Pen Needle 31G X 5 MM MISC 100 each by Does not apply route 2 (two) times daily. 02/19/21   Gildardo Pounds, NP  mirabegron ER (MYRBETRIQ) 25 MG TB24 tablet Take 1 tablet (25 mg total) by mouth daily. Patient not taking: Reported on 06/16/2021 06/15/21   Cleon Gustin, MD  oxyCODONE-acetaminophen (PERCOCET) 10-325 MG tablet Take 1 tablet by mouth 3 (three) times daily as needed for pain. Patient not taking: Reported on 06/16/2021 06/09/21   Cleon Gustin, MD  tiZANidine (ZANAFLEX) 4 MG tablet Take 1 tablet (4 mg total) by mouth every 6 (six) hours as needed for muscle spasms. Patient not taking: Reported on 06/16/2021 03/08/21   Gildardo Pounds, NP      Allergies    Patient has no known allergies.    Review of Systems   Review of Systems  Genitourinary:  Positive for flank pain.   Physical Exam Updated Vital Signs BP 124/77 (BP Location: Right Arm)   Pulse 94   Temp 98.6 F (37 C) (Oral)   Resp 19   Ht _0  (1.6 m)   Wt 108.5 kg   SpO2 94%   BMI 42.35 kg/m  Physical Exam Vitals and nursing note reviewed. Exam conducted with a chaperone present.  Constitutional:      Appearance: Normal appearance.  HENT:     Head: Normocephalic and atraumatic.  Eyes:     General: No scleral icterus.       Right eye: No discharge.        Left eye: No discharge.     Extraocular Movements: Extraocular movements intact.     Pupils: Pupils are equal, round, and reactive to light.  Cardiovascular:     Rate and Rhythm: Normal rate and regular  rhythm.     Pulses: Normal pulses.     Heart sounds: Normal heart sounds. No murmur heard.   No friction rub. No gallop.  Pulmonary:     Effort: Pulmonary effort is normal. No respiratory distress.     Breath sounds: Normal breath sounds.  Abdominal:     General: Abdomen is flat. Bowel sounds are normal. There is no distension.     Palpations: Abdomen is soft.     Tenderness: There is no abdominal tenderness. There is left CVA tenderness.  Skin:    General: Skin is warm and dry.  Coloration: Skin is not jaundiced.     Findings: Rash present.  Neurological:     Mental Status: She is alert. Mental status is at baseline.     Coordination: Coordination normal.     ED Results / Procedures / Treatments   Labs (all labs ordered are listed, but only abnormal results are displayed) Labs Reviewed  URINALYSIS, ROUTINE W REFLEX MICROSCOPIC - Abnormal; Notable for the following components:      Result Value   Color, Urine AMBER (*)    Hgb urine dipstick LARGE (*)    Protein, ur 100 (*)    Nitrite POSITIVE (*)    Leukocytes,Ua TRACE (*)    RBC / HPF >50 (*)    Bacteria, UA RARE (*)    All other components within normal limits  CBC - Abnormal; Notable for the following components:   WBC 14.7 (*)    RBC 3.83 (*)    Hemoglobin 11.6 (*)    RDW 17.2 (*)    Platelets 536 (*)    All other components within normal limits  BASIC METABOLIC PANEL - Abnormal; Notable for the following components:   Glucose, Bld 177 (*)    BUN 28 (*)    Creatinine, Ser 1.31 (*)    GFR, Estimated 51 (*)    All other components within normal limits  GLUCOSE, CAPILLARY - Abnormal; Notable for the following components:   Glucose-Capillary 160 (*)    All other components within normal limits  URINE CULTURE  CULTURE, BLOOD (ROUTINE X 2)  CULTURE, BLOOD (ROUTINE X 2)  CBC  CREATININE, SERUM  BASIC METABOLIC PANEL  CBC    EKG None  Radiology CT Renal Stone Study  Result Date: 06/16/2021 CLINICAL  DATA:  Flank pain, kidney stone suspected Left flank pain.  Stent placement. EXAM: CT ABDOMEN AND PELVIS WITHOUT CONTRAST TECHNIQUE: Multidetector CT imaging of the abdomen and pelvis was performed following the standard protocol without IV contrast. RADIATION DOSE REDUCTION: This exam was performed according to the departmental dose-optimization program which includes automated exposure control, adjustment of the mA and/or kV according to patient size and/or use of iterative reconstruction technique. COMPARISON:  CT 06/03/2021 FINDINGS: Lower chest: No acute findings. Hepatobiliary: Diffusely decreased hepatic density consistent with steatosis. There is focal fatty sparing adjacent to the gallbladder fossa. The liver is enlarged spanning 21.8 cm cranial caudal. No discrete liver lesion. Gallbladder physiologically distended, no calcified stone. No biliary dilatation. Pancreas: No ductal dilatation or inflammation. Spleen: Splenectomy.  Minimal splenosis in the left upper quadrant. Adrenals/Urinary Tract: Normal adrenal glands. Left nephroureteral stent in place, proximal pigtail in the renal pelvis, distal pigtail in the bladder. The renal collecting system is decompressed. There is mild left perinephric edema. 7 mm nonobstructing stone in the lower left kidney. Punctate nonobstructing left upper pole stone there are no stone or stone fragments along the course of the stent. There is no right hydronephrosis or renal calculus. The urinary bladder is completely empty. No bladder stone. Stomach/Bowel: Decompressed stomach. There is no small bowel obstruction or inflammation. Normal appendix. Moderate volume of colonic stool. No colonic wall thickening or pericolonic edema. Vascular/Lymphatic: Mild aortic atherosclerosis. No aortic aneurysm. No abdominopelvic adenopathy. Reproductive: Hysterectomy.  No adnexal mass. Other: No ascites or free air. No abdominopelvic collection. There is a moderate-sized fat containing  umbilical hernia mild edema diffusely in the subcutaneous fat. Musculoskeletal: Mild L5-S1 disc space narrowing. There are no acute or suspicious osseous abnormalities. IMPRESSION: 1. Left nephroureteral stent in  place with decompression of the renal collecting system. No stone along the course of the stent. Nonobstructing left nephrolithiasis. 2. Minimal left perinephric edema, recommend correlation with urinalysis to assess for urinary tract infection. 3. Hepatic steatosis and hepatomegaly. 4. Moderate-sized fat containing umbilical hernia. Aortic Atherosclerosis (ICD10-I70.0). Electronically Signed   By: Keith Rake M.D.   On: 06/16/2021 20:33    Procedures Procedures    Medications Ordered in ED Medications  enoxaparin (LOVENOX) injection 40 mg (has no administration in time range)  acetaminophen (TYLENOL) tablet 650 mg (has no administration in time range)    Or  acetaminophen (TYLENOL) suppository 650 mg (has no administration in time range)  ondansetron (ZOFRAN) tablet 4 mg (has no administration in time range)    Or  ondansetron (ZOFRAN) injection 4 mg (has no administration in time range)  atorvastatin (LIPITOR) tablet 40 mg (has no administration in time range)  busPIRone (BUSPAR) tablet 15 mg (has no administration in time range)  escitalopram (LEXAPRO) tablet 20 mg (has no administration in time range)  traZODone (DESYREL) tablet 150 mg (has no administration in time range)  insulin detemir (LEVEMIR) injection 10 Units (has no administration in time range)  tamsulosin (FLOMAX) capsule 0.4 mg (has no administration in time range)  gabapentin (NEURONTIN) capsule 600 mg (has no administration in time range)  gabapentin (NEURONTIN) capsule 300 mg (has no administration in time range)  diphenhydrAMINE (BENADRYL) capsule 25 mg (has no administration in time range)  insulin aspart (novoLOG) injection 0-9 Units (has no administration in time range)  cefTRIAXone (ROCEPHIN) 1 g in  sodium chloride 0.9 % 100 mL IVPB (has no administration in time range)  0.9 %  sodium chloride infusion (has no administration in time range)  pantoprazole (PROTONIX) EC tablet 40 mg (has no administration in time range)  HYDROmorphone (DILAUDID) injection 1 mg (has no administration in time range)  oxyCODONE (Oxy IR/ROXICODONE) immediate release tablet 5 mg (has no administration in time range)  polyethylene glycol (MIRALAX / GLYCOLAX) packet 17 g (has no administration in time range)  neomycin-bacitracin-polymyxin (NEOSPORIN) ointment (has no administration in time range)  HYDROmorphone (DILAUDID) injection 1 mg (1 mg Intravenous Given 06/16/21 1923)  sodium chloride 0.9 % bolus 1,000 mL (0 mLs Intravenous Stopped 06/16/21 2040)  ketorolac (TORADOL) 30 MG/ML injection 30 mg (30 mg Intravenous Given 06/16/21 2046)  cefTRIAXone (ROCEPHIN) 1 g in sodium chloride 0.9 % 100 mL IVPB (0 g Intravenous Stopped 06/16/21 2128)  HYDROmorphone (DILAUDID) injection 1 mg (1 mg Intravenous Given 06/16/21 2058)  diazepam (VALIUM) tablet 10 mg (10 mg Oral Given 06/16/21 2129)    ED Course/ Medical Decision Making/ A&P                           Medical Decision Making Amount and/or Complexity of Data Reviewed Radiology: ordered.  Risk Prescription drug management. Decision regarding hospitalization.   This patient presents to the ED for concern of flank pain, this involves an extensive number of treatment options, and is a complaint that carries with it a high risk of complications and morbidity.  The differential diagnosis includes UTI, nephrolithiasis, pyelonephritis  Patient does have left CVA tenderness.  Her abdomen is soft, she does not appear in any acute distress on my exam.  Her vitals are stable, she is slightly hypertensive but not febrile or tachycardic.  Does not meet SIRS criteria.  Additional history obtained:   Reviewed external records, patient was seen in  the office by Dr. Alyson Ingles  yesterday.  Patient has very complicated urology logic history including multiple many stones.    Lab Tests:  I ordered, viewed, and personally interpreted labs.  The pertinent results include: Patient is a leukocytosis of 14.7.  Per chart review patient tends to have a chronic leukocytosis is actually improved from most recent labs.  BMP without gross electrolyte derangement, she has a slight bump in creatinine at 1.31.  UA is notable for nitrites and leukocytes consistent with UTI.    Imaging Studies ordered:  I directly visualized the CT Renal, which showed   IMPRESSION:  1. Left nephroureteral stent in place with decompression of the  renal collecting system. No stone along the course of the stent.  Nonobstructing left nephrolithiasis.  2. Minimal left perinephric edema, recommend correlation with  urinalysis to assess for urinary tract infection.  3. Hepatic steatosis and hepatomegaly.  4. Moderate-sized fat containing umbilical hernia.    I agree with the radiologist interpretation    ECG/Cardiac monitoring:    The patient was maintained on a cardiac monitor.  Visualized monitor strip which showed NSR per my interpretation.    Medicines ordered and prescription drug management:  I ordered medication including: Fluids, Rocephin, Toradol, 2 mg of Dilaudid, 10 mg valium.  I have reviewed the patients home medicines and have made adjustments as needed   Reevaluation:  After the interventions noted above, I reevaluated the patient and found patient is tearful and combative.  She states there is absolutely no improvement of her pain.  As we discussed the results of her work-up she is tearful.    Consultations Obtained:  I requested consultation with the urolgoist Dr. Alyson Ingles.  Discussed lab and imaging findings as well as pertinent plan - they recommend:   Patient case is very familiar with him.  He saw her in the office yesterday, states she was requesting strong pain  medicine yesterday as well.  He did send her home with Dilaudid but told her she was not comfortable prescribing more than that.  States she has a very edematous ureter and needs to have the stent in, he states he went over this with the patient in the office already and stent should not be removed emergently. He advises giving her 10 mg POof Valium.  He states that he is able to see her on Friday or she can see his partner Dr. Joaquim Lai in the office tomorrow.  I appreciate his consultation and help in caring for this patient.   Plan- Patient does have a leukocytosis, she does have worsening creatinine with creatinine of 1.3 which is increased compared to previous.  On the CT scan there is edema which could be consistent with early pyelonephritis especially in the context of UTI.  I will talk with hospitalist service to see if we can for pyelonephritis IV antibiotics.    Social Determinants of Health:    Disposition:   After consideration of the diagnostic results and the patients response to treatment, I feel that the patent would benefit from admission.            Final Clinical Impression(s) / ED Diagnoses Final diagnoses:  Pyelonephritis    Rx / DC Orders ED Discharge Orders     None         Sherrill Raring, Hershal Coria 06/16/21 2307    Luna Fuse, MD 06/25/21 2246

## 2021-06-16 NOTE — Assessment & Plan Note (Signed)
Burn type ulcerated skin lesion at the abdominal wall, present on admission ?Add topical neosporin for now.  ?

## 2021-06-16 NOTE — ED Provider Triage Note (Deleted)
Emergency Medicine Provider Triage Evaluation Note ? ?Jamie Conley , a 46 y.o. female  was evaluated in triage.  Pt complains of abdominal pain/flank pain.  She specifically has left flank pain for the last 3 days, states she has a kidney stone has a stent placed.  Has not passed stones, followed by Dr. Alyson Ingles.  Seen in office yesterday, scheduled for ureteroscopy in June.  Patient given 4 mg of Dilaudid but states the pain is too severe and she is unable to tolerate having the "stent in her any longer".  Endorses decreased urine output but denies any dysuria or hematuria. ? ?Review of Systems  ?Per HPI ? ?Physical Exam  ?BP (!) 130/93   Pulse (!) 104   Temp 98.2 ?F (36.8 ?C) (Oral)   Resp 20   Ht '5\' 3"'$  (1.6 m)   Wt 108.1 kg   SpO2 96%   BMI 42.20 kg/m?  ?Gen:   Awake, no distress   ?Resp:  Normal effort  ?MSK:   Moves extremities without difficulty  ?Other:  Left CVAT ? ?Medical Decision Making  ?Medically screening exam initiated at 5:48 PM.  Appropriate orders placed.  Jamie Conley was informed that the remainder of the evaluation will be completed by another provider, this initial triage assessment does not replace that evaluation, and the importance of remaining in the ED until their evaluation is complete. ? ? ?  ?Sherrill Raring, PA-C ?06/16/21 1750 ? ?

## 2021-06-16 NOTE — H&P (Addendum)
History and Physical    Patient: Jamie Conley WJX:[id number] DOB: 01/12/1976 DOA: 06/16/2021 DOS: the patient was seen and examined on 06/16/2021 PCP: Claiborne Rigg, NP  Patient coming from: Home  Chief Complaint:  Chief Complaint  Patient presents with   Flank Pain   HPI: Jamie Conley is a 46 y.o. female with medical history significant of renal stones, T2DM, depression and hypertension who presented to the ED with refractive left flank pain.  Patient had cystoscopy with left retrograde pyelography, left ureteroscopic stone manipulation with basket extraction, and left 6 x 26 JJ stent placement on 06/09/21. She was prescribed opioid analgesics for pain control. At home she continued to have persistent left flank pain, and her analgesic regimen was changed from hydrocodone to oxycodone, but her pain continued to be uncontrolled. She then was placed on hydromorphone 4 mg po that unfortunately was not effective in controlling her pain. Her urologist Dr. Ronne Binning recommended her to come to the ED for further management.   For the last 2 days besides flank pain she also had experienced, chills, nausea and poor appetite. Positive generalized weakness and today she had a syncope episode while seating on the commode trying to urinate.  No dysuria but urinary tenesmus.  Left flank pain is 10/10, sharp in nature, radiated from the flank to the frontal abdomen with no improving or worsening factors.   Review of Systems: As mentioned in the history of present illness. All other systems reviewed and are negative. Past Medical History:  Diagnosis Date   Anxiety    Asthma    Cancer (HCC)    Depression    Diabetes mellitus without complication (HCC)    Enlarged liver    Fibromyalgia    High cholesterol    History of kidney stones    Hypertension    Migraine    Past Surgical History:  Procedure Laterality Date   ABDOMINAL HYSTERECTOMY     ANTERIOR AND POSTERIOR REPAIR      colon polyectomy     CYSTOSCOPY WITH RETROGRADE PYELOGRAM, URETEROSCOPY AND STENT PLACEMENT Left 06/09/2021   Procedure: CYSTOSCOPY WITH RETROGRADE PYELOGRAM, URETEROSCOPY AND STENT PLACEMENT;  Surgeon: Malen Gauze, MD;  Location: [facility];  Service: Urology;  Laterality: Left;   CYSTOSCOPY/URETEROSCOPY/HOLMIUM LASER/STENT PLACEMENT Left 01/12/2021   Procedure: CYSTOSCOPY/URETEROSCOPY/HOLMIUM LASER/STENT PLACEMENT;  Surgeon: Despina Arias, MD;  Location: [facility];  Service: Urology;  Laterality: Left;   LEFT OOPHORECTOMY     LITHOTRIPSY     NASAL POLYP SURGERY     SPLENECTOMY     WRIST SURGERY Left    Social History:  reports that she has never smoked. She has never used smokeless tobacco. She reports current drug use. She reports that she does not drink alcohol.  No Known Allergies  Family History  Problem Relation Age of Onset   Hypertension Mother    High Cholesterol Mother    Heart attack Father     Prior to Admission medications   Medication Sig Start Date End Date Taking? Authorizing Provider  atorvastatin (LIPITOR) 40 MG tablet Take 1 tablet (40 mg total) by mouth daily. 01/01/21  Yes Amin, Ankit Chirag, MD  busPIRone (BUSPAR) 15 MG tablet Take 1 tablet (15 mg total) by mouth 2 (two) times daily. 02/19/21  Yes Claiborne Rigg, NP  diphenhydrAMINE (BENADRYL) 25 MG tablet Take 25 mg by mouth every 6 (six) hours as needed for allergies.   Yes [provider]  Dulaglutide (TRULICITY) 0.75 MG/0.5ML  SOPN Inject 0.75 mg into the skin once a week. 06/16/21  Yes Newlin, Odette Horns, MD  escitalopram (LEXAPRO) 20 MG tablet Take 1 tablet (20 mg total) by mouth daily. 02/19/21 07/03/21 Yes Claiborne Rigg, NP  gabapentin (NEURONTIN) 300 MG capsule Take 1 capsule (300 mg total) by mouth 3 (three) times daily. Patient taking differently: Take 300 mg by mouth See admin instructions. Take 600 mg in the morning 300 mg in the evening 02/19/21 06/16/21 Yes Claiborne Rigg, NP   HYDROmorphone (DILAUDID) 4 MG tablet Take 1 tablet (4 mg total) by mouth every 4 (four) hours as needed for severe pain. 06/15/21  Yes McKenzie, Mardene Celeste, MD  insulin aspart (NOVOLOG FLEXPEN) 100 UNIT/ML FlexPen For blood sugars 0-150 give 0 units of insulin, 151-200 give 2 units of insulin, 201-250 give 4 units, 251-300 give 6 units, 301-350 give 8 units, 351-400 give 10 units,> 400 give 12 units and call M.D. Discussed hypoglycemia protocol. 02/19/21  Yes Claiborne Rigg, NP  insulin aspart protamine- aspart (NOVOLOG MIX 70/30) (70-30) 100 UNIT/ML injection Inject 20 Units into the skin 2 (two) times daily with a meal.   Yes [provider]  insulin detemir (LEVEMIR) 100 UNIT/ML FlexPen inject 20 units into the skin at bedtime 02/19/21  Yes   lisinopril (ZESTRIL) 20 MG tablet Take 1 tablet (20 mg total) by mouth daily. 02/19/21  Yes Claiborne Rigg, NP  metFORMIN (GLUCOPHAGE) 1000 MG tablet Take 1 tablet (1,000 mg total) by mouth 2 (two) times daily with a meal. 02/19/21  Yes Claiborne Rigg, NP  ondansetron (ZOFRAN) 4 MG tablet Take 1 tablet (4 mg total) by mouth daily as needed for nausea or vomiting. 06/09/21 06/09/22 Yes McKenzie, Mardene Celeste, MD  tamsulosin (FLOMAX) 0.4 MG CAPS capsule Take 1 capsule (0.4 mg total) by mouth daily after supper. 06/09/21  Yes McKenzie, Mardene Celeste, MD  traZODone (DESYREL) 150 MG tablet Take 1 tablet (150 mg total) by mouth at bedtime as needed for sleep. Patient taking differently: Take 150 mg by mouth at bedtime. 02/19/21 07/03/21 Yes Claiborne Rigg, NP  blood glucose meter kit and supplies KIT Dispense based on patient and insurance preference. Use up to four times daily as directed. 01/01/21   Amin, Loura Halt, MD  carisoprodol (SOMA) 350 MG tablet Take 1 tablet (350 mg total) by mouth 3 (three) times daily. Patient not taking: Reported on 06/16/2021 05/28/21   Renne Crigler, PA-C  carisoprodol (SOMA) 350 MG tablet Take 1 tablet (350 mg total) by mouth 3  (three) times daily. Patient not taking: Reported on 06/16/2021 06/03/21   Vanetta Mulders, MD  cefUROXime (CEFTIN) 500 MG tablet Take 1 tablet (500 mg total) by mouth 2 (two) times daily with a meal. Patient not taking: Reported on 06/16/2021 05/26/21   Malen Gauze, MD  etodolac (LODINE) 300 MG capsule Take 1 capsule (300 mg total) by mouth every 8 (eight) hours. Patient not taking: Reported on 06/16/2021 03/17/21   Linwood Dibbles, MD  Insulin Pen Needle 31G X 5 MM MISC 100 each by Does not apply route 2 (two) times daily. 02/19/21   Claiborne Rigg, NP  mirabegron ER (MYRBETRIQ) 25 MG TB24 tablet Take 1 tablet (25 mg total) by mouth daily. Patient not taking: Reported on 06/16/2021 06/15/21   Malen Gauze, MD  oxyCODONE-acetaminophen (PERCOCET) 10-325 MG tablet Take 1 tablet by mouth 3 (three) times daily as needed for pain. Patient not taking: Reported on 06/16/2021 06/09/21  McKenzie, Mardene Celeste, MD  tiZANidine (ZANAFLEX) 4 MG tablet Take 1 tablet (4 mg total) by mouth every 6 (six) hours as needed for muscle spasms. Patient not taking: Reported on 06/16/2021 03/08/21   Claiborne Rigg, NP    Physical Exam: Vitals:   06/16/21 1558 06/16/21 1830 06/16/21 1930 06/16/21 2000  BP: (!) 130/93 (!) 121/97 129/70 (!) 148/132  Pulse: (!) 104 93 95 83  Resp: 20 18 18 18   Temp: 98.2 F (36.8 C)     TempSrc: Oral     SpO2: 96% 95% 97% 96%  Weight: 108.1 kg     Height: 5\' 3"  (1.6 m)      Neurology awake and alert ENT with mild pallor Cardiovascular with S1 and S2 present and rhythmic with no gallops, rubs or murmurs Respiratory with no rales or rhonchi Abdomen not distended Positive left costovertebral tenderness No lower extremity edema Skin with superficial round oval skin ulcerated lesion, stage 2 with no erythema or edema.  Data Reviewed:   46 yo female with chronic renal stones with recent urologic procedure including stone manipulation and basket extraction with stent placement on  the left ureter, on 05/03. She had persistent pain refractive to oral opioid analgesics, and for the last 2 days, chills, nausea, poor oral intake, worsening pain and one vasovagal syncope episode. On her initial physical examination she is in pain, her blood pressure is elevated and she is tachycardic. Positive left costovertebral angle tenderness.   Na 142, K 4,2 Cl 109, bicarbonate 23, glucose 117 bun 28 cr 1,31  Wbc 14,7 hgb 11.6 plt 536  Urine analysis with SG 1,015, 100 protein, >50 rbc, 11-20 wbc, positive nitrites.   Abdominal CT with left nephroureteral stent in place with decompression of the renal collecting system. No stone along the course of the stent. Non obstructing left nephrolithiasis. Minimal perinephric edema on the left.   Patient will be admitted to the hospital with the working diagnosis of left pyelonephritis complicated with sepsis present on admission.    Assessment and Plan: * Pyelonephritis Patient with tachycardia and leukocytosis consistent with sepsis (present on admission).  Plan to continue volume repletion with isotonic saline at 100 ml per hr.  Antibiotic therapy with IV ceftriaxone Follow up cultures and temperature curve Follow up with urology recommendations, she will be seen in am by the Urology team.  Continue pain control with hydromorphone 1 mg IV q 4 hrs as needed for severe pain and oxycodone for moderate pain.  Continue with antiemetics and antiacids.  Resume tamsulosin.   AKI (acute kidney injury) (HCC) Pre-renal renal failure  Plan to continue volume repletion with isotonic saline at 100 ml per hr Follow up renal function in am, avoid hypotension and nephrotoxic medications.   Type 2 diabetes mellitus with hyperlipidemia (HCC) Continue glucose cover and monitoring with insulin sliding scale Resume basal insulin at a reduced dose of 10 units, considering her poor oral intake, nausea and vomiting.  Hold on oral hypoglycemic agents.    Continue with statin therapy.   Fibromyalgia Depression  Continue pain control and supportive medical care On buspirone, escitalopram, gabapentin and trazodone.   Abdominal wall skin ulcer (HCC) Burn type ulcerated skin lesion at the abdominal wall, present on admission Add topical neosporin for now.   Class 3 obesity (HCC) Calculated BMI is 42,2       Advance Care Planning:   Code Status: Prior full  Consults: Urology consulted in th ED   Family Communication:  no family at the bedside   Severity of Illness: The appropriate patient status for this patient is INPATIENT. Inpatient status is judged to be reasonable and necessary in order to provide the required intensity of service to ensure the patient's safety. The patient's presenting symptoms, physical exam findings, and initial radiographic and laboratory data in the context of their chronic comorbidities is felt to place them at high risk for further clinical deterioration. Furthermore, it is not anticipated that the patient will be medically stable for discharge from the hospital within 2 midnights of admission.   * I certify that at the point of admission it is my clinical judgment that the patient will require inpatient hospital care spanning beyond 2 midnights from the point of admission due to high intensity of service, high risk for further deterioration and high frequency of surveillance required.*  Author: Coralie Keens, MD 06/16/2021 9:47 PM  For on call review [email or web address].

## 2021-06-16 NOTE — ED Triage Notes (Signed)
Pt reports that she has kidney stones. Reports L flank pain.  Reports stent placed.  Pt is alert and oriented.  Resp even and unlabored.  Reports pain is unbearable today. Reports takinf dilaudid at home '4mg'$  with no relief.    ?

## 2021-06-16 NOTE — Assessment & Plan Note (Addendum)
Continue glucose cover and monitoring with insulin sliding scale ?Resume basal insulin at a reduced dose of 10 units, considering her poor oral intake, nausea and vomiting.  ?Hold on oral hypoglycemic agents.  ? ?Continue with statin therapy.  ?

## 2021-06-16 NOTE — Assessment & Plan Note (Signed)
Pre-renal renal failure ? ?Plan to continue volume repletion with isotonic saline at 100 ml per hr ?Follow up renal function in am, avoid hypotension and nephrotoxic medications.  ?

## 2021-06-17 DIAGNOSIS — L98499 Non-pressure chronic ulcer of skin of other sites with unspecified severity: Secondary | ICD-10-CM | POA: Diagnosis not present

## 2021-06-17 DIAGNOSIS — N12 Tubulo-interstitial nephritis, not specified as acute or chronic: Secondary | ICD-10-CM | POA: Diagnosis not present

## 2021-06-17 DIAGNOSIS — N179 Acute kidney failure, unspecified: Secondary | ICD-10-CM | POA: Diagnosis not present

## 2021-06-17 DIAGNOSIS — E1169 Type 2 diabetes mellitus with other specified complication: Secondary | ICD-10-CM | POA: Diagnosis not present

## 2021-06-17 LAB — CBC
HCT: 32.1 % — ABNORMAL LOW (ref 36.0–46.0)
Hemoglobin: 10.2 g/dL — ABNORMAL LOW (ref 12.0–15.0)
MCH: 30.5 pg (ref 26.0–34.0)
MCHC: 31.8 g/dL (ref 30.0–36.0)
MCV: 96.1 fL (ref 80.0–100.0)
Platelets: 480 10*3/uL — ABNORMAL HIGH (ref 150–400)
RBC: 3.34 MIL/uL — ABNORMAL LOW (ref 3.87–5.11)
RDW: 17 % — ABNORMAL HIGH (ref 11.5–15.5)
WBC: 13.2 10*3/uL — ABNORMAL HIGH (ref 4.0–10.5)
nRBC: 0 % (ref 0.0–0.2)

## 2021-06-17 LAB — GLUCOSE, CAPILLARY
Glucose-Capillary: 153 mg/dL — ABNORMAL HIGH (ref 70–99)
Glucose-Capillary: 130 mg/dL — ABNORMAL HIGH (ref 70–99)
Glucose-Capillary: 174 mg/dL — ABNORMAL HIGH (ref 70–99)

## 2021-06-17 LAB — BASIC METABOLIC PANEL
Anion gap: 6 (ref 5–15)
BUN: 27 mg/dL — ABNORMAL HIGH (ref 6–20)
CO2: 21 mmol/L — ABNORMAL LOW (ref 22–32)
Calcium: 8.2 mg/dL — ABNORMAL LOW (ref 8.9–10.3)
Chloride: 115 mmol/L — ABNORMAL HIGH (ref 98–111)
Creatinine, Ser: 1.13 mg/dL — ABNORMAL HIGH (ref 0.44–1.00)
GFR, Estimated: >60 mL/min (ref >60)
Glucose, Bld: 154 mg/dL — ABNORMAL HIGH (ref 70–99)
Potassium: 3.8 mmol/L (ref 3.5–5.1)
Sodium: 142 mmol/L (ref 135–145)

## 2021-06-17 MED ORDER — ALPRAZOLAM 0.5 MG PO TABS
0.5000 mg | ORAL_TABLET | Freq: Two times a day (BID) | ORAL | Status: DC | PRN
  Administered 2021-06-17 – 2021-06-18 (×2): 0.5 mg via ORAL
  Filled 2021-06-17 (×2): qty 1

## 2021-06-17 MED ORDER — HYDROMORPHONE HCL 1 MG/ML IJ SOLN
1.0000 mg | INTRAMUSCULAR | Status: DC | PRN
  Administered 2021-06-17 (×6): 1 mg via INTRAVENOUS
  Filled 2021-06-17 (×6): qty 1

## 2021-06-17 MED ORDER — BACITRACIN-NEOMYCIN-POLYMYXIN 400-5-5000 EX OINT
TOPICAL_OINTMENT | Freq: Three times a day (TID) | CUTANEOUS | Status: DC
  Administered 2021-06-20: 1 via TOPICAL
  Filled 2021-06-17 (×2): qty 1

## 2021-06-17 NOTE — TOC Initial Note (Signed)
Transition of Care (TOC) - Initial/Assessment Note  ? ? ?Patient Details  ?Name: Jamie Conley ?MRN: [id number] ?Date of Birth: [birth year] ? ?Transition of Care (TOC) CM/SW Contact:    ?Iona Beard, LCSWA ?Phone Number: ?06/17/2021, 1:54 PM ? ?Clinical Narrative:                 ?Pt is high risk for readmission. CSW spoke with pt to complete assessment. Pt lives with a roommate and her son. Pt is mostly independent at baseline. Pt is able to provide her own transportation. Pt has had HH many years ago. Pt does not use any DME. TOC to follow for possible D/C needs.  ? ?Expected Discharge Plan: Home/Self Care ?Barriers to Discharge: Continued Medical Work up ? ? ?Patient Goals and CMS Choice ?Patient states their goals for this hospitalization and ongoing recovery are:: return home ?CMS Medicare.gov Compare Post Acute Care list provided to:: Patient ?Choice offered to / list presented to : Patient ? ?Expected Discharge Plan and Services ?Expected Discharge Plan: Home/Self Care ?  ?  ?  ?Living arrangements for the past 2 months: [place] ?                ?  ?  ?  ?  ?  ?  ?  ?  ?  ?  ? ?Prior Living Arrangements/Services ?Living arrangements for the past 2 months: [place] ?Lives with:: Adult Children, Roommate ?Patient language and need for interpreter reviewed:: Yes ?Do you feel safe going back to the place where you live?: Yes      ?Need for Family Participation in Patient Care: Yes (Comment) ?Care giver support system in place?: Yes (comment) ?  ?Criminal Activity/Legal Involvement Pertinent to Current Situation/Hospitalization: No - Comment as needed ? ?Activities of Daily Living ?Home Assistive Devices/Equipment: Eyeglasses, CBG Meter ?ADL Screening (condition at time of admission) ?Patient's cognitive ability adequate to safely complete daily activities?: Yes ?Is the patient deaf or have difficulty hearing?: No ?Does the patient have difficulty seeing, even when wearing  glasses/contacts?: No ?Does the patient have difficulty concentrating, remembering, or making decisions?: Yes ?Patient able to express need for assistance with ADLs?: Yes ?Does the patient have difficulty dressing or bathing?: No ?Independently performs ADLs?: Yes (appropriate for developmental age) ?Does the patient have difficulty walking or climbing stairs?: Yes (Stated she has times of being dizzy) ?Weakness of Legs: Both ?Weakness of Arms/Hands: Both ? ?Permission Sought/Granted ?  ?  ?   ?   ?   ?   ? ?Emotional Assessment ?Appearance:: Appears stated age ?Attitude/Demeanor/Rapport: Engaged ?Affect (typically observed): Accepting ?Orientation: : Oriented to Self, Oriented to Place, Oriented to  Time, Oriented to Situation ?Alcohol / Substance Use: Not Applicable ?Psych Involvement: No (comment) ? ?Admission diagnosis:  Pyelonephritis [N12] ?Patient Active Problem List  ? Diagnosis Date Noted  ? Pyelonephritis 06/16/2021  ? AKI (acute kidney injury) ([place]) 06/16/2021  ? Type 2 diabetes mellitus with hyperlipidemia ([place])   ? Fibromyalgia   ? Class 3 obesity (HCC)   ? Abdominal wall skin ulcer ([place])   ? Status migrainosus   ? Postconcussive syndrome   ? Hepatic steatosis 12/25/2020  ? Aortic atherosclerosis ([place]) 12/25/2020  ? Depression   ? Hypertension   ? Migraine   ? Anxiety   ? Leukocytosis   ? Hyperlipidemia   ? Type 2 diabetes mellitus with hyperglycemia (HCC)   ? Hypokalemia   ? Syncopal episodes   ? Headache  12/24/2020  ? ?PCP:  Gildardo Pounds, NP ?Pharmacy:   ?[facility] [employer] [facility], [address] ?[place] [address] ?Phone: [phone] Fax: [phone] ? ? ? ? ?Social Determinants of Health (SDOH) Interventions ?  ? ?Readmission Risk Interventions ? ?  06/17/2021  ?  1:53 PM  ?Readmission Risk Prevention Plan  ?Transportation Screening Complete  ?Medication Review Press photographer) Complete  ?[place] or Home Care Consult Complete  ?SW Recovery  Care/Counseling Consult Complete  ?Palliative Care Screening Not Applicable  ?[place] Not Applicable  ? ? ? ?

## 2021-06-17 NOTE — Progress Notes (Signed)
? ?[facility] ?PROGRESS NOTE ? ? ?Jamie Conley HGD:[id number] DOB: 1975-09-04 DOA: 06/16/2021 ? ?PCP: Gildardo Pounds, NP ? ?Brief History/Interval Summary: 46 y.o. female with medical history significant of renal stones, T2DM, depression and hypertension who presented to the ED with refractive left flank pain. Patient had cystoscopy with left retrograde pyelography, left ureteroscopic stone manipulation with basket extraction, and left 6 x 26 JJ stent placement on 06/09/21. She was prescribed opioid analgesics for pain control. At home she continued to have persistent left flank pain, and her analgesic regimen was changed from hydrocodone to oxycodone, but her pain continued to be uncontrolled. She then was placed on hydromorphone 4 mg po that unfortunately was not effective in controlling her pain. Her urologist Dr. Alyson Ingles recommended her to come to the ED for further management.  Patient was found to have abnormal UA.  Her symptoms were thought to be suggestive of pyelonephritis.  She was hospitalized for further management. ? ?Consultants: Urology ? ?Procedures: None yet ? ? ? ?Subjective/Interval History: ?Patient complains of 9 out of 10 pain in the left flank area.  Some nausea but no vomiting.  No shortness of breath or chest pain. ? ? ? ?Assessment/Plan: ? ?Acute pyelonephritis/sepsis present on admission ?Patient was noted to have tachycardia and leukocytosis at the time of admission. ?CT renal stone study showed left nephroureteral stent in place with decompression of the renal system.  No stone was noted.  Nonobstructing left nephrolithiasis was noted.  Left perinephric edema was appreciated.  No abscess was noted. ?Follow-up on urine cultures.  Continue with ceftriaxone for now. ?Pain management.  WBC slightly better this morning.  Heart rate has improved. ? ?Left renal stone ?Underwent cystoscopy, pyelogram, stone extraction and stent placement on 5/3 by Dr. Alyson Ingles.  Stable  findings are noted on CT scan. ? ?Acute kidney injury ?Likely prerenal azotemia.  Wreaking was 1.31 at presentation.  Improved to 1.13 today.  Continue with IV fluids.  Monitor urine output. ? ?Diabetes mellitus type 2 with hyperlipidemia ?Uses insulin at home.  Currently on Levemir and SSI.  Monitor CBGs.  HbA1c 11.4 as of November 2022.  We will recheck tomorrow. ?Continue statin. ? ?Abdominal wound skin wound ?Ulcerated skin lesion noted on abdominal wall.  Yellowish exudate noted.  No abscess reported on CT scan.  Use the antibiotic ointment 3 times a day. ? ?Normocytic anemia ?Mild drop in hemoglobin is likely dilutional.  No evidence of overt bleeding. ? ?History of fibromyalgia ?Continue home medications.  Noted to be on buspirone, Lexapro, gabapentin and trazodone. ? ?Class III obesity ?Estimated body mass index is 42.35 kg/m? as calculated from the following: ?  Height as of this encounter: '5\' 3"'$  (1.6 m). ?  Weight as of this encounter: 108.5 kg. ? ? ?DVT Prophylaxis: Lovenox ?Code Status: Full code ?Family Communication: Discussed with patient ?Disposition Plan: Hopefully return home when improved.  Can mobilize. ? ?Status is: Inpatient ?Remains inpatient appropriate because: Acute pyelonephritis requiring IV antibiotics ? ? ? ? ? ?Medications: Scheduled: ? atorvastatin  40 mg Oral Daily  ? busPIRone  15 mg Oral BID  ? enoxaparin (LOVENOX) injection  40 mg Subcutaneous QHS  ? escitalopram  20 mg Oral Daily  ? gabapentin  300 mg Oral QPM  ? gabapentin  600 mg Oral q morning  ? insulin aspart  0-9 Units Subcutaneous TID WC  ? insulin detemir  10 Units Subcutaneous Q2200  ? neomycin-bacitracin-polymyxin   Topical TID  ? neomycin-bacitracin-polymyxin  Topical BID  ? pantoprazole  40 mg Oral Daily  ? polyethylene glycol  17 g Oral Daily  ? tamsulosin  0.4 mg Oral QPC supper  ? traZODone  150 mg Oral QHS  ? ?Continuous: ? sodium chloride 100 mL/hr at 06/16/21 2344  ? cefTRIAXone (ROCEPHIN)  IV     ? ?[id number] **OR** acetaminophen, diphenhydrAMINE, HYDROmorphone (DILAUDID) injection, ondansetron **OR** ondansetron (ZOFRAN) IV, oxyCODONE ? ?Antibiotics: ?Anti-infectives (From admission, onward)  ? ? Start     Dose/Rate Route Frequency Ordered Stop  ? 06/17/21 2000  cefTRIAXone (ROCEPHIN) 1 g in sodium chloride 0.9 % 100 mL IVPB       ? 1 g ?200 mL/hr over 30 Minutes Intravenous Every 24 hours 06/16/21 2249    ? 06/16/21 2045  cefTRIAXone (ROCEPHIN) 1 g in sodium chloride 0.9 % 100 mL IVPB       ? 1 g ?200 mL/hr over 30 Minutes Intravenous  Once 06/16/21 2040 06/16/21 2128  ? ?  ? ? ?Objective: ? ?Vital Signs ? ?Vitals:  ? 06/17/21 0316 06/17/21 0318 06/17/21 0504 06/17/21 5809  ?BP: (!) 93/54 (!) 90/52 122/77 120/72  ?Pulse: 76 79 75 78  ?Resp: '16 16  16  '$ ?Temp: 97.9 ?F (36.6 ?C) 97.9 ?F (36.6 ?C)  98.3 ?F (36.8 ?C)  ?TempSrc: Oral Oral  Oral  ?SpO2: 97% 97% 97% 96%  ?Weight:      ?Height:      ? ? ?Intake/Output Summary (Last 24 hours) at 06/17/2021 0931 ?Last data filed at 06/17/2021 0500 ?Gross per 24 hour  ?Intake 1574.05 ml  ?Output --  ?Net 1574.05 ml  ? ?Filed Weights  ? 06/16/21 1558 06/16/21 2255  ?Weight: 108.1 kg 108.5 kg  ? ? ?General appearance: Awake alert.  In no distress ?Resp: Clear to auscultation bilaterally.  Normal effort ?Cardio: S1-S2 is normal regular.  No S3-S4.  No rubs murmurs or bruit ?GI: Abdomen is soft.  Nontender nondistended.  Bowel sounds are present normal.  No masses organomegaly ?Left CVA tenderness noted ?3 cm x 2 cm ulcerated lesion noted in the abdominal wall around the suprapubic area.  Yellowish exudate noted.  No bleeding appreciated ?Extremities: No edema.  Full range of motion of lower extremities. ?Neurologic: Alert and oriented x3.  No focal neurological deficits.  ? ? ?Lab Results: ? ?Data Reviewed: I have personally reviewed following labs and reports of the imaging studies ? ?CBC: ?Recent Labs  ?Lab 06/16/21 ?1632 06/17/21 ?0354  ?WBC 14.7* 13.2*  ?HGB  11.6* 10.2*  ?HCT 36.8 32.1*  ?MCV 96.1 96.1  ?PLT 536* 480*  ? ? ?Basic Metabolic Panel: ?Recent Labs  ?Lab 06/16/21 ?1632 06/17/21 ?0354  ?NA 142 142  ?K 4.2 3.8  ?CL 109 115*  ?CO2 23 21*  ?GLUCOSE 177* 154*  ?BUN 28* 27*  ?CREATININE 1.31* 1.13*  ?CALCIUM 9.2 8.2*  ? ? ?GFR: ?Estimated Creatinine Clearance: 73.5 mL/min (A) (by C-G formula based on SCr of 1.13 mg/dL (H)). ? ? ?CBG: ?Recent Labs  ?Lab 06/16/21 ?2249  ?GLUCAP 160*  ? ? ? ?Recent Results (from the past 240 hour(s))  ?Culture, blood (Routine X 2) w Reflex to ID Panel     Status: None (Preliminary result)  ? Collection Time: 06/16/21 11:45 PM  ? Specimen: Right Antecubital; Blood  ?Result Value Ref Range Status  ? Specimen Description RIGHT ANTECUBITAL  Final  ? Special Requests   Final  ?  BOTTLES DRAWN AEROBIC AND ANAEROBIC Blood Culture results may  not be optimal due to an excessive volume of blood received in culture bottles  ? Culture   Final  ?  NO GROWTH < 12 HOURS ?Performed at [facility], [address], [place], [place] [address] ?  ? Report Status PENDING  Incomplete  ?Culture, blood (Routine X 2) w Reflex to ID Panel     Status: None (Preliminary result)  ? Collection Time: 06/16/21 11:45 PM  ? Specimen: BLOOD LEFT FOREARM  ?Result Value Ref Range Status  ? Specimen Description BLOOD LEFT FOREARM  Final  ? Special Requests   Final  ?  BOTTLES DRAWN AEROBIC AND ANAEROBIC Blood Culture results may not be optimal due to an excessive volume of blood received in culture bottles  ? Culture   Final  ?  NO GROWTH < 12 HOURS ?Performed at [facility], [address] [place], [place] [address] ?  ? Report Status PENDING  Incomplete  ?  ? ? ?Radiology Studies: ?CT Renal Stone Study ? ?Result Date: 06/16/2021 ?CLINICAL DATA:  Flank pain, kidney stone suspected Left flank pain.  Stent placement. EXAM: CT ABDOMEN AND PELVIS WITHOUT CONTRAST TECHNIQUE: Multidetector CT imaging of the abdomen and pelvis was performed following the standard protocol  without IV contrast. RADIATION DOSE REDUCTION: This exam was performed according to the departmental dose-optimization program which includes automated exposure control, adjustment of the mA and/or kV according to patient size

## 2021-06-18 DIAGNOSIS — E785 Hyperlipidemia, unspecified: Secondary | ICD-10-CM | POA: Diagnosis not present

## 2021-06-18 DIAGNOSIS — R109 Unspecified abdominal pain: Secondary | ICD-10-CM

## 2021-06-18 DIAGNOSIS — Z960 Presence of urogenital implants: Secondary | ICD-10-CM | POA: Diagnosis not present

## 2021-06-18 DIAGNOSIS — E1169 Type 2 diabetes mellitus with other specified complication: Secondary | ICD-10-CM | POA: Diagnosis not present

## 2021-06-18 DIAGNOSIS — N12 Tubulo-interstitial nephritis, not specified as acute or chronic: Secondary | ICD-10-CM | POA: Diagnosis not present

## 2021-06-18 DIAGNOSIS — N179 Acute kidney failure, unspecified: Secondary | ICD-10-CM | POA: Diagnosis not present

## 2021-06-18 LAB — BASIC METABOLIC PANEL
Anion gap: 4 — ABNORMAL LOW (ref 5–15)
BUN: 20 mg/dL (ref 6–20)
CO2: 20 mmol/L — ABNORMAL LOW (ref 22–32)
Calcium: 7.6 mg/dL — ABNORMAL LOW (ref 8.9–10.3)
Chloride: 115 mmol/L — ABNORMAL HIGH (ref 98–111)
Creatinine, Ser: 0.78 mg/dL (ref 0.44–1.00)
GFR, Estimated: >60 mL/min (ref >60)
Glucose, Bld: 236 mg/dL — ABNORMAL HIGH (ref 70–99)
Potassium: 3.5 mmol/L (ref 3.5–5.1)
Sodium: 139 mmol/L (ref 135–145)

## 2021-06-18 LAB — CBC
HCT: 30.3 % — ABNORMAL LOW (ref 36.0–46.0)
Hemoglobin: 9.7 g/dL — ABNORMAL LOW (ref 12.0–15.0)
MCH: 30.9 pg (ref 26.0–34.0)
MCHC: 32 g/dL (ref 30.0–36.0)
MCV: 96.5 fL (ref 80.0–100.0)
Platelets: 459 10*3/uL — ABNORMAL HIGH (ref 150–400)
RBC: 3.14 MIL/uL — ABNORMAL LOW (ref 3.87–5.11)
RDW: 17.1 % — ABNORMAL HIGH (ref 11.5–15.5)
WBC: 14 10*3/uL — ABNORMAL HIGH (ref 4.0–10.5)
nRBC: 0.1 % (ref 0.0–0.2)

## 2021-06-18 LAB — GLUCOSE, CAPILLARY
Glucose-Capillary: 170 mg/dL — ABNORMAL HIGH (ref 70–99)
Glucose-Capillary: 174 mg/dL — ABNORMAL HIGH (ref 70–99)
Glucose-Capillary: 173 mg/dL — ABNORMAL HIGH (ref 70–99)
Glucose-Capillary: 139 mg/dL — ABNORMAL HIGH (ref 70–99)

## 2021-06-18 LAB — HEMOGLOBIN A1C
Hgb A1c MFr Bld: 7.1 % — ABNORMAL HIGH (ref 4.8–5.6)
Mean Plasma Glucose: 157.07 mg/dL

## 2021-06-18 MED ORDER — POTASSIUM CHLORIDE CRYS ER 20 MEQ PO TBCR
40.0000 meq | EXTENDED_RELEASE_TABLET | Freq: Once | ORAL | Status: AC
  Administered 2021-06-18: 40 meq via ORAL
  Filled 2021-06-18: qty 2

## 2021-06-18 MED ORDER — DIAZEPAM 5 MG PO TABS
5.0000 mg | ORAL_TABLET | Freq: Four times a day (QID) | ORAL | Status: DC | PRN
  Administered 2021-06-18 – 2021-06-21 (×5): 5 mg via ORAL
  Filled 2021-06-18 (×5): qty 1

## 2021-06-18 MED ORDER — OXYCODONE HCL ER 20 MG PO T12A
20.0000 mg | EXTENDED_RELEASE_TABLET | Freq: Two times a day (BID) | ORAL | Status: DC
  Administered 2021-06-18 – 2021-06-23 (×9): 20 mg via ORAL
  Filled 2021-06-18 (×11): qty 1

## 2021-06-18 MED ORDER — HYDROMORPHONE HCL 1 MG/ML IJ SOLN
2.0000 mg | INTRAMUSCULAR | Status: DC | PRN
  Administered 2021-06-18 – 2021-06-23 (×39): 2 mg via INTRAVENOUS
  Filled 2021-06-18 (×40): qty 2

## 2021-06-18 MED ORDER — OXYCODONE HCL 5 MG PO TABS
5.0000 mg | ORAL_TABLET | ORAL | Status: DC | PRN
  Administered 2021-06-18 – 2021-06-22 (×8): 10 mg via ORAL
  Filled 2021-06-18 (×8): qty 2

## 2021-06-18 NOTE — Progress Notes (Signed)
Provided patient ordered '20mg'$  oxycodone by mouth. Shortly there after patient called out for the '2mg'$  IV dilaudid. Went to patient's room to explain that I could not administer these two so close together. Patient was agitated at that time. Notified the physician team of distress, but will provide dilaudid at appropriate available time. ?

## 2021-06-18 NOTE — Progress Notes (Signed)
? ?[facility] ?PROGRESS NOTE ? ? ?Jamie Conley WCH:[id number] DOB: 01-15-1976 DOA: 06/16/2021 ? ?PCP: Jamie Pounds, NP ? ?Brief History/Interval Summary: 46 y.o. female with medical history significant of renal stones, T2DM, depression and hypertension who presented to the ED with refractive left flank pain. Patient had cystoscopy with left retrograde pyelography, left ureteroscopic stone manipulation with basket extraction, and left 6 x 26 JJ stent placement on 06/09/21. She was prescribed opioid analgesics for pain control. At home she continued to have persistent left flank pain, and her analgesic regimen was changed from hydrocodone to oxycodone, but her pain continued to be uncontrolled. She then was placed on hydromorphone 4 mg po that unfortunately was not effective in controlling her pain. Her urologist Dr. Alyson Ingles recommended her to come to the ED for further management.  Patient was found to have abnormal UA.  Her symptoms were thought to be suggestive of pyelonephritis.  She was hospitalized for further management. ? ?Consultants: Urology ? ?Procedures: None yet ? ? ? ?Subjective/Interval History: ?Overnight events noted.  Patient continues to have severe pain in the left flank area.  Denies any nausea vomiting.  Able to tolerate her diet.   ? ? ? ?Assessment/Plan: ? ?Acute pyelonephritis/sepsis present on admission ?Patient was noted to have tachycardia and leukocytosis at the time of admission. ?CT renal stone study showed left nephroureteral stent in place with decompression of the renal system.  No stone was noted.  Nonobstructing left nephrolithiasis was noted.  Left perinephric edema was appreciated.  No abscess was noted. ?Patient was started on ceftriaxone.  Follow-up on urine cultures. ?WBC noted to be higher today.  She remains afebrile. ?Discussed with Dr. Rip Harbour with urology who will evaluate patient today. ?Pain control has been very difficult to achieve.  Patient  requesting higher doses of narcotics. ?Bowel regimen. ? ?Left renal stone ?Underwent cystoscopy, pyelogram, stone extraction and stent placement on 5/3 by Dr. Alyson Ingles.  Stable findings are noted on CT scan. ? ?Acute kidney injury ?Likely prerenal azotemia.  Creatinine was 1.31 at presentation.  Now back to normal.  Since she has reasonably good oral intake we will decrease the rate of her IV fluids.   ? ?Diabetes mellitus type 2 with hyperlipidemia ?Uses insulin at home.  Currently on Levemir and SSI.  Monitor CBGs.  HbA1c 11.4 as of November 2022.  Noted to be 7.1 today. ?Continue statin. ? ?Abdominal wound skin wound ?Ulcerated skin lesion noted on abdominal wall.  Yellowish exudate noted.  No abscess reported on CT scan.  Use the antibiotic ointment 3 times a day. ? ?Normocytic anemia ?Drop in hemoglobin noted again.  Likely dilutional.  No evidence of overt bleeding. ? ?History of fibromyalgia ?Continue home medications.  Noted to be on buspirone, Lexapro, gabapentin and trazodone. ? ?Class III obesity ?Estimated body mass index is 42.35 kg/m? as calculated from the following: ?  Height as of this encounter: '5\' 3"'$  (1.6 m). ?  Weight as of this encounter: 108.5 kg. ? ? ?DVT Prophylaxis: Lovenox ?Code Status: Full code ?Family Communication: Discussed with patient ?Disposition Plan: Hopefully return home when improved.  Can mobilize. ? ?Status is: Inpatient ?Remains inpatient appropriate because: Acute pyelonephritis requiring IV antibiotics ? ? ? ? ? ?Medications: Scheduled: ? atorvastatin  40 mg Oral Daily  ? busPIRone  15 mg Oral BID  ? enoxaparin (LOVENOX) injection  40 mg Subcutaneous QHS  ? escitalopram  20 mg Oral Daily  ? gabapentin  300 mg Oral QPM  ?  gabapentin  600 mg Oral q morning  ? insulin aspart  0-9 Units Subcutaneous TID WC  ? insulin detemir  10 Units Subcutaneous Q2200  ? neomycin-bacitracin-polymyxin   Topical TID  ? pantoprazole  40 mg Oral Daily  ? polyethylene glycol  17 g Oral Daily  ?  tamsulosin  0.4 mg Oral QPC supper  ? traZODone  150 mg Oral QHS  ? ?Continuous: ? sodium chloride 100 mL/hr at 06/18/21 0636  ? cefTRIAXone (ROCEPHIN)  IV Stopped (06/17/21 2047)  ? ?HUD:[id number] **OR** acetaminophen, ALPRAZolam, diphenhydrAMINE, HYDROmorphone (DILAUDID) injection, ondansetron **OR** ondansetron (ZOFRAN) IV, oxyCODONE ? ?Antibiotics: ?Anti-infectives (From admission, onward)  ? ? Start     Dose/Rate Route Frequency Ordered Stop  ? 06/17/21 2000  cefTRIAXone (ROCEPHIN) 1 g in sodium chloride 0.9 % 100 mL IVPB       ? 1 g ?200 mL/hr over 30 Minutes Intravenous Every 24 hours 06/16/21 2249    ? 06/16/21 2045  cefTRIAXone (ROCEPHIN) 1 g in sodium chloride 0.9 % 100 mL IVPB       ? 1 g ?200 mL/hr over 30 Minutes Intravenous  Once 06/16/21 2040 06/16/21 2128  ? ?  ? ? ?Objective: ? ?Vital Signs ? ?Vitals:  ? 06/17/21 1318 06/17/21 2018 06/17/21 2313 06/18/21 0514  ?BP: 119/80 113/67 130/79 107/70  ?Pulse: 93 93 88 77  ?Resp: 18     ?Temp: 98.8 ?F (37.1 ?C)     ?TempSrc: Oral     ?SpO2: 97% 98%    ?Weight:      ?Height:      ? ? ?Intake/Output Summary (Last 24 hours) at 06/18/2021 1043 ?Last data filed at 06/18/2021 0308 ?Gross per 24 hour  ?Intake 2801.41 ml  ?Output --  ?Net 2801.41 ml  ? ? ?Filed Weights  ? 06/16/21 1558 06/16/21 2255  ?Weight: 108.1 kg 108.5 kg  ? ? ?General appearance: Awake alert.  In no distress ?Resp: Clear to auscultation bilaterally.  Normal effort ?Cardio: S1-S2 is normal regular.  No S3-S4.  No rubs murmurs or bruit ?GI: Abdomen is soft.  Tender in the left flank area.  No rebound rigidity or guarding.  Bowel sounds present.  No masses organomegaly. ?Extremities: No edema.  Full range of motion of lower extremities. ?Neurologic: Alert and oriented x3.  No focal neurological deficits.  ? ? ?Lab Results: ? ?Data Reviewed: I have personally reviewed following labs and reports of the imaging studies ? ?CBC: ?Recent Labs  ?Lab 06/16/21 ?1632 06/17/21 ?0354 06/18/21 ?0421  ?WBC  14.7* 13.2* 14.0*  ?HGB 11.6* 10.2* 9.7*  ?HCT 36.8 32.1* 30.3*  ?MCV 96.1 96.1 96.5  ?PLT 536* 480* 459*  ? ? ? ?Basic Metabolic Panel: ?Recent Labs  ?Lab 06/16/21 ?1632 06/17/21 ?0354 06/18/21 ?0421  ?NA 142 142 139  ?K 4.2 3.8 3.5  ?CL 109 115* 115*  ?CO2 23 21* 20*  ?GLUCOSE 177* 154* 236*  ?BUN 28* 27* 20  ?CREATININE 1.31* 1.13* 0.78  ?CALCIUM 9.2 8.2* 7.6*  ? ? ? ?GFR: ?Estimated Creatinine Clearance: 103.8 mL/min (by C-G formula based on SCr of 0.78 mg/dL). ? ? ?CBG: ?Recent Labs  ?Lab 06/16/21 ?2249 06/17/21 ?1150 06/17/21 ?1607 06/17/21 ?2127 06/18/21 ?0756  ?GLUCAP 160* 153* 130* 174* 170*  ? ? ? ? ?Recent Results (from the past 240 hour(s))  ?Urine Culture     Status: Abnormal (Preliminary result)  ? Collection Time: 06/16/21  5:18 PM  ? Specimen: Urine, Clean Catch  ?Result Value Ref  Range Status  ? Specimen Description   Final  ?  URINE, CLEAN CATCH ?Performed at [facility], [address], [place], [place] [address] ?  ? Special Requests   Final  ?  NONE ?Performed at [facility], [address] [place], [place] [address] ?  ? Culture (A)  Final  ?  20,000 COLONIES/mL ESCHERICHIA COLI ?SUSCEPTIBILITIES TO FOLLOW ?Performed at [facility], [place] [address] [place], [place] [address] ?  ? Report Status PENDING  Incomplete  ?Culture, blood (Routine X 2) w Reflex to ID Panel     Status: None (Preliminary result)  ? Collection Time: 06/16/21 11:45 PM  ? Specimen: Right Antecubital; Blood  ?Result Value Ref Range Status  ? Specimen Description RIGHT ANTECUBITAL  Final  ? Special Requests   Final  ?  BOTTLES DRAWN AEROBIC AND ANAEROBIC Blood Culture results may not be optimal due to an excessive volume of blood received in culture bottles  ? Culture   Final  ?  NO GROWTH 1 DAY ?Performed at [facility], [address], [place], [place] [address] ?  ? Report Status PENDING  Incomplete  ?Culture, blood (Routine X 2) w Reflex to ID Panel     Status: None (Preliminary result)  ? Collection Time: 06/16/21  11:45 PM  ? Specimen: BLOOD LEFT FOREARM  ?Result Value Ref Range Status  ? Specimen Description BLOOD LEFT FOREARM  Final  ? Special Requests   Final  ?  BOTTLES DRAWN AEROBIC AND ANAEROBIC Blood Culture

## 2021-06-18 NOTE — Progress Notes (Signed)
Patient is having a tremendous amount of pain. Patient is unable to void. She tries and only little dribbles or nothing at all comes out. Patient says she thinks her stent is not correctly placed. She said this pain is nothing like she has ever experienced before. Patient wanting her hydromorphone dose increased because the 1 mg isn't helping manage her pain. MD Arrien made aware. Received PRN order for hydromorphone 2 mg every 3 hours. Bladder scanned patient. 112 was noted. MD Arrien made aware of bladder scan results. Will continue to monitor.  ?

## 2021-06-18 NOTE — Progress Notes (Signed)
Patient with quite a bit of pain this morning. Provided pain med as ordered. Took meds well with sip of water. Dressed lower abdominal wound. Scant amount of drainage noted on old dressing.  ?

## 2021-06-18 NOTE — H&P (View-Only) (Signed)
Urology Consult  ?Referring physician: Dr. [place] Pink ?Reason for referral: left flank pain with indwelling stent ? ?Chief Complaint: left flank pain ? ?History of Present Illness: Jamie Conley is a 46yo with a history of nephrolithiasis who underwent left ureteral stent placement on 5/3 for intractable pain from a left UPJ calculus. Since stent placement she has had increased left flank pain requiring increased doses of narcotic pain medication. She continues to have severe left flank pain and presented to the ER on 5/10 with intractable left flank pain. She underwent CT in the ER which showed the left ureteral stent in good position with no hydronephrosis. Urine culture currently growing ecoli and patient is on rocephin. She has dilaudid 1-'2mg'$  every 3 hours ordered and is requesting a stronger pain medication. ? ?Past Medical History:  ?Diagnosis Date  ? Anxiety   ? Asthma   ? Cancer [facility])   ? Depression   ? Diabetes mellitus without complication ([place])   ? Enlarged liver   ? Fibromyalgia   ? High cholesterol   ? History of kidney stones   ? Hypertension   ? Migraine   ? ?Past Surgical History:  ?Procedure Laterality Date  ? ABDOMINAL HYSTERECTOMY    ? ANTERIOR AND POSTERIOR REPAIR    ? colon polyectomy    ? CYSTOSCOPY WITH RETROGRADE PYELOGRAM, URETEROSCOPY AND STENT PLACEMENT Left 06/09/2021  ? Procedure: CYSTOSCOPY WITH RETROGRADE PYELOGRAM, URETEROSCOPY AND STENT PLACEMENT;  Surgeon: Cleon Gustin, MD;  Location: [facility];  Service: Urology;  Laterality: Left;  ? CYSTOSCOPY/URETEROSCOPY/HOLMIUM LASER/STENT PLACEMENT Left 01/12/2021  ? Procedure: CYSTOSCOPY/URETEROSCOPY/HOLMIUM LASER/STENT PLACEMENT;  Surgeon: Vira Agar, MD;  Location: [facility];  Service: Urology;  Laterality: Left;  ? LEFT OOPHORECTOMY    ? LITHOTRIPSY    ? NASAL POLYP SURGERY    ? SPLENECTOMY    ? WRIST SURGERY Left   ? ? ?Medications: I have reviewed the patient's current medications. ?Allergies: No Known Allergies ? ?Family History   ?Problem Relation Age of Onset  ? Hypertension Mother   ? High Cholesterol Mother   ? Heart attack Father   ? ?Social History:  reports that she has never smoked. She has never used smokeless tobacco. She reports current drug use. She reports that she does not drink alcohol. ? ?Review of Systems  ?Genitourinary:  Positive for difficulty urinating, dysuria, flank pain and frequency.  ?All other systems reviewed and are negative. ? ?Physical Exam:  ?Vital signs in last 24 hours: ?Pulse Rate:  [77-93] 77 (05/12 0514) ?BP: (107-130)/(67-79) 107/70 (05/12 0514) ?SpO2:  [98 %] 98 % (05/11 2018) ?Physical Exam ?Vitals reviewed.  ?Constitutional:   ?   Appearance: Normal appearance.  ?HENT:  ?   Head: Normocephalic and atraumatic.  ?   Mouth/Throat:  ?   Mouth: Mucous membranes are dry.  ?Eyes:  ?   Extraocular Movements: Extraocular movements intact.  ?   Pupils: Pupils are equal, round, and reactive to light.  ?Cardiovascular:  ?   Rate and Rhythm: Normal rate and regular rhythm.  ?Pulmonary:  ?   Effort: Pulmonary effort is normal. No respiratory distress.  ?Abdominal:  ?   General: Abdomen is flat.  ?   Palpations: Abdomen is soft.  ?Musculoskeletal:     ?   General: No swelling. Normal range of motion.  ?   Cervical back: Normal range of motion and neck supple.  ?Skin: ?   General: Skin is warm and dry.  ?Neurological:  ?  General: No focal deficit present.  ?   Mental Status: She is alert and oriented to person, place, and time.  ?Psychiatric:     ?   Mood and Affect: Mood normal.     ?   Behavior: Behavior normal.     ?   Thought Content: Thought content normal.     ?   Judgment: Judgment normal.  ? ? ?Laboratory Data:  ?Results for orders placed or performed during the hospital encounter of 06/16/21 (from the past 72 hour(s))  ?CBC     Status: Abnormal  ? Collection Time: 06/16/21  4:32 PM  ?Result Value Ref Range  ? WBC 14.7 (H) 4.0 - 10.5 K/uL  ? RBC 3.83 (L) 3.87 - 5.11 MIL/uL  ? Hemoglobin 11.6 (L) 12.0 - 15.0  g/dL  ? HCT 36.8 36.0 - 46.0 %  ? MCV 96.1 80.0 - 100.0 fL  ? MCH 30.3 26.0 - 34.0 pg  ? MCHC 31.5 30.0 - 36.0 g/dL  ? RDW 17.2 (H) 11.5 - 15.5 %  ? Platelets 536 (H) 150 - 400 K/uL  ? nRBC 0.0 0.0 - 0.2 %  ?  Comment: Performed at [facility], [address], [place], [place] [address]  ?Basic metabolic panel     Status: Abnormal  ? Collection Time: 06/16/21  4:32 PM  ?Result Value Ref Range  ? Sodium 142 135 - 145 mmol/L  ? Potassium 4.2 3.5 - 5.1 mmol/L  ? Chloride 109 98 - 111 mmol/L  ? CO2 23 22 - 32 mmol/L  ? Glucose, Bld 177 (H) 70 - 99 mg/dL  ?  Comment: Glucose reference range applies only to samples taken after fasting for at least 8 hours.  ? BUN 28 (H) 6 - 20 mg/dL  ? Creatinine, Ser 1.31 (H) 0.44 - 1.00 mg/dL  ? Calcium 9.2 8.9 - 10.3 mg/dL  ? GFR, Estimated 51 (L) >60 mL/min  ?  Comment: (NOTE) ?Calculated using the CKD-EPI Creatinine Equation (2021) ?  ? Anion gap 10 5 - 15  ?  Comment: Performed at [facility], [address], [place], [place] [address]  ?Urinalysis, Routine w reflex microscopic     Status: Abnormal  ? Collection Time: 06/16/21  5:18 PM  ?Result Value Ref Range  ? Color, Urine AMBER (A) YELLOW  ?  Comment: BIOCHEMICALS MAY BE AFFECTED BY COLOR  ? APPearance CLEAR CLEAR  ? Specific Gravity, Urine 1.015 1.005 - 1.030  ? pH 6.0 5.0 - 8.0  ? Glucose, UA NEGATIVE NEGATIVE mg/dL  ? Hgb urine dipstick LARGE (A) NEGATIVE  ? Bilirubin Urine NEGATIVE NEGATIVE  ? Ketones, ur NEGATIVE NEGATIVE mg/dL  ? Protein, ur 100 (A) NEGATIVE mg/dL  ? Nitrite POSITIVE (A) NEGATIVE  ? Leukocytes,Ua TRACE (A) NEGATIVE  ? RBC / HPF >50 (H) 0 - 5 RBC/hpf  ? WBC, UA 11-20 0 - 5 WBC/hpf  ? Bacteria, UA RARE (A) NONE SEEN  ? Squamous Epithelial / LPF 0-5 0 - 5  ? Mucus PRESENT   ? Hyaline Casts, UA PRESENT   ?  Comment: Performed at [facility], [address], [place], [place] [address]  ?Urine Culture     Status: Abnormal (Preliminary result)  ? Collection Time: 06/16/21  5:18 PM  ? Specimen: Urine, Clean Catch   ?Result Value Ref Range  ? Specimen Description    ?  URINE, CLEAN CATCH ?Performed at [facility], [address] [place], [place] [address] ?  ?  Special Requests    ?  NONE ?Performed at [facility], [address], [place], [place] [address] ?  ? Culture (A)   ?  20,000 COLONIES/mL ESCHERICHIA COLI ?SUSCEPTIBILITIES TO FOLLOW ?Performed at [facility], [place] [address], [place], [place] [address] ?  ? Report Status PENDING   ?Glucose, capillary     Status: Abnormal  ? Collection Time: 06/16/21 10:49 PM  ?Result Value Ref Range  ? Glucose-Capillary 160 (H) 70 - 99 mg/dL  ?  Comment: Glucose reference range applies only to samples taken after fasting for at least 8 hours.  ?Culture, blood (Routine X 2) w Reflex to ID Panel     Status: None (Preliminary result)  ? Collection Time: 06/16/21 11:45 PM  ? Specimen: Right Antecubital; Blood  ?Result Value Ref Range  ? Specimen Description RIGHT ANTECUBITAL   ? Special Requests    ?  BOTTLES DRAWN AEROBIC AND ANAEROBIC Blood Culture results may not be optimal due to an excessive volume of blood received in culture bottles  ? Culture    ?  NO GROWTH 1 DAY ?Performed at [facility], [address] [place], [place] [address] ?  ? Report Status PENDING   ?Culture, blood (Routine X 2) w Reflex to ID Panel     Status: None (Preliminary result)  ? Collection Time: 06/16/21 11:45 PM  ? Specimen: BLOOD LEFT FOREARM  ?Result Value Ref Range  ? Specimen Description BLOOD LEFT FOREARM   ? Special Requests    ?  BOTTLES DRAWN AEROBIC AND ANAEROBIC Blood Culture results may not be optimal due to an excessive volume of blood received in culture bottles  ? Culture    ?  NO GROWTH 1 DAY ?Performed at [facility], [address], [place], [place] [address] ?  ? Report Status PENDING   ?Basic metabolic panel     Status: Abnormal  ? Collection Time: 06/17/21  3:54 AM  ?Result Value Ref Range  ? Sodium 142 135 - 145 mmol/L  ? Potassium 3.8 3.5 - 5.1 mmol/L  ? Chloride 115 (H) 98 - 111  mmol/L  ? CO2 21 (L) 22 - 32 mmol/L  ? Glucose, Bld 154 (H) 70 - 99 mg/dL  ?  Comment: Glucose reference range applies only to samples taken after fasting for at least 8 hours.  ? BUN 27 (H) 6 - 20 mg/dL

## 2021-06-18 NOTE — Consult Note (Signed)
Urology Consult  ?Referring physician: Dr. Maryland Pink ?Reason for referral: left flank pain with indwelling stent ? ?Chief Complaint: left flank pain ? ?History of Present Illness: Jamie Conley is a 46yo with a history of nephrolithiasis who underwent left ureteral stent placement on 5/3 for intractable pain from a left UPJ calculus. Since stent placement she has had increased left flank pain requiring increased doses of narcotic pain medication. She continues to have severe left flank pain and presented to the ER on 5/10 with intractable left flank pain. She underwent CT in the ER which showed the left ureteral stent in good position with no hydronephrosis. Urine culture currently growing ecoli and patient is on rocephin. She has dilaudid 1-'2mg'$  every 3 hours ordered and is requesting a stronger pain medication. ? ?Past Medical History:  ?Diagnosis Date  ? Anxiety   ? Asthma   ? Cancer [facility])   ? Depression   ? Diabetes mellitus without complication ([place])   ? Enlarged liver   ? Fibromyalgia   ? High cholesterol   ? History of kidney stones   ? Hypertension   ? Migraine   ? ?Past Surgical History:  ?Procedure Laterality Date  ? ABDOMINAL HYSTERECTOMY    ? ANTERIOR AND POSTERIOR REPAIR    ? colon polyectomy    ? CYSTOSCOPY WITH RETROGRADE PYELOGRAM, URETEROSCOPY AND STENT PLACEMENT Left 06/09/2021  ? Procedure: CYSTOSCOPY WITH RETROGRADE PYELOGRAM, URETEROSCOPY AND STENT PLACEMENT;  Surgeon: Cleon Gustin, MD;  Location: [facility];  Service: Urology;  Laterality: Left;  ? CYSTOSCOPY/URETEROSCOPY/HOLMIUM LASER/STENT PLACEMENT Left 01/12/2021  ? Procedure: CYSTOSCOPY/URETEROSCOPY/HOLMIUM LASER/STENT PLACEMENT;  Surgeon: Vira Agar, MD;  Location: [facility];  Service: Urology;  Laterality: Left;  ? LEFT OOPHORECTOMY    ? LITHOTRIPSY    ? NASAL POLYP SURGERY    ? SPLENECTOMY    ? WRIST SURGERY Left   ? ? ?Medications: I have reviewed the patient's current medications. ?Allergies: No Known Allergies ? ?Family History   ?Problem Relation Age of Onset  ? Hypertension Mother   ? High Cholesterol Mother   ? Heart attack Father   ? ?Social History:  reports that she has never smoked. She has never used smokeless tobacco. She reports current drug use. She reports that she does not drink alcohol. ? ?Review of Systems  ?Genitourinary:  Positive for difficulty urinating, dysuria, flank pain and frequency.  ?All other systems reviewed and are negative. ? ?Physical Exam:  ?Vital signs in last 24 hours: ?Pulse Rate:  [77-93] 77 (05/12 0514) ?BP: (107-130)/(67-79) 107/70 (05/12 0514) ?SpO2:  [98 %] 98 % (05/11 2018) ?Physical Exam ?Vitals reviewed.  ?Constitutional:   ?   Appearance: Normal appearance.  ?HENT:  ?   Head: Normocephalic and atraumatic.  ?   Mouth/Throat:  ?   Mouth: Mucous membranes are dry.  ?Eyes:  ?   Extraocular Movements: Extraocular movements intact.  ?   Pupils: Pupils are equal, round, and reactive to light.  ?Cardiovascular:  ?   Rate and Rhythm: Normal rate and regular rhythm.  ?Pulmonary:  ?   Effort: Pulmonary effort is normal. No respiratory distress.  ?Abdominal:  ?   General: Abdomen is flat.  ?   Palpations: Abdomen is soft.  ?Musculoskeletal:     ?   General: No swelling. Normal range of motion.  ?   Cervical back: Normal range of motion and neck supple.  ?Skin: ?   General: Skin is warm and dry.  ?Neurological:  ?  General: No focal deficit present.  ?   Mental Status: She is alert and oriented to person, place, and time.  ?Psychiatric:     ?   Mood and Affect: Mood normal.     ?   Behavior: Behavior normal.     ?   Thought Content: Thought content normal.     ?   Judgment: Judgment normal.  ? ? ?Laboratory Data:  ?Results for orders placed or performed during the hospital encounter of 06/16/21 (from the past 72 hour(s))  ?CBC     Status: Abnormal  ? Collection Time: 06/16/21  4:32 PM  ?Result Value Ref Range  ? WBC 14.7 (H) 4.0 - 10.5 K/uL  ? RBC 3.83 (L) 3.87 - 5.11 MIL/uL  ? Hemoglobin 11.6 (L) 12.0 - 15.0  g/dL  ? HCT 36.8 36.0 - 46.0 %  ? MCV 96.1 80.0 - 100.0 fL  ? MCH 30.3 26.0 - 34.0 pg  ? MCHC 31.5 30.0 - 36.0 g/dL  ? RDW 17.2 (H) 11.5 - 15.5 %  ? Platelets 536 (H) 150 - 400 K/uL  ? nRBC 0.0 0.0 - 0.2 %  ?  Comment: Performed at [facility], [address], [place], [place] [address]  ?Basic metabolic panel     Status: Abnormal  ? Collection Time: 06/16/21  4:32 PM  ?Result Value Ref Range  ? Sodium 142 135 - 145 mmol/L  ? Potassium 4.2 3.5 - 5.1 mmol/L  ? Chloride 109 98 - 111 mmol/L  ? CO2 23 22 - 32 mmol/L  ? Glucose, Bld 177 (H) 70 - 99 mg/dL  ?  Comment: Glucose reference range applies only to samples taken after fasting for at least 8 hours.  ? BUN 28 (H) 6 - 20 mg/dL  ? Creatinine, Ser 1.31 (H) 0.44 - 1.00 mg/dL  ? Calcium 9.2 8.9 - 10.3 mg/dL  ? GFR, Estimated 51 (L) >60 mL/min  ?  Comment: (NOTE) ?Calculated using the CKD-EPI Creatinine Equation (2021) ?  ? Anion gap 10 5 - 15  ?  Comment: Performed at [facility], [address] [place], [place] [address]  ?Urinalysis, Routine w reflex microscopic     Status: Abnormal  ? Collection Time: 06/16/21  5:18 PM  ?Result Value Ref Range  ? Color, Urine AMBER (A) YELLOW  ?  Comment: BIOCHEMICALS MAY BE AFFECTED BY COLOR  ? APPearance CLEAR CLEAR  ? Specific Gravity, Urine 1.015 1.005 - 1.030  ? pH 6.0 5.0 - 8.0  ? Glucose, UA NEGATIVE NEGATIVE mg/dL  ? Hgb urine dipstick LARGE (A) NEGATIVE  ? Bilirubin Urine NEGATIVE NEGATIVE  ? Ketones, ur NEGATIVE NEGATIVE mg/dL  ? Protein, ur 100 (A) NEGATIVE mg/dL  ? Nitrite POSITIVE (A) NEGATIVE  ? Leukocytes,Ua TRACE (A) NEGATIVE  ? RBC / HPF >50 (H) 0 - 5 RBC/hpf  ? WBC, UA 11-20 0 - 5 WBC/hpf  ? Bacteria, UA RARE (A) NONE SEEN  ? Squamous Epithelial / LPF 0-5 0 - 5  ? Mucus PRESENT   ? Hyaline Casts, UA PRESENT   ?  Comment: Performed at [facility], [address], [place], [place] [address]  ?Urine Culture     Status: Abnormal (Preliminary result)  ? Collection Time: 06/16/21  5:18 PM  ? Specimen: Urine, Clean Catch   ?Result Value Ref Range  ? Specimen Description    ?  URINE, CLEAN CATCH ?Performed at [facility], [address], [place], [place] [address] ?  ?  Special Requests    ?  NONE ?Performed at [facility], [address] [place], [place] [address] ?  ? Culture (A)   ?  20,000 COLONIES/mL ESCHERICHIA COLI ?SUSCEPTIBILITIES TO FOLLOW ?Performed at [facility], [place] [address], [place], [place] [address] ?  ? Report Status PENDING   ?Glucose, capillary     Status: Abnormal  ? Collection Time: 06/16/21 10:49 PM  ?Result Value Ref Range  ? Glucose-Capillary 160 (H) 70 - 99 mg/dL  ?  Comment: Glucose reference range applies only to samples taken after fasting for at least 8 hours.  ?Culture, blood (Routine X 2) w Reflex to ID Panel     Status: None (Preliminary result)  ? Collection Time: 06/16/21 11:45 PM  ? Specimen: Right Antecubital; Blood  ?Result Value Ref Range  ? Specimen Description RIGHT ANTECUBITAL   ? Special Requests    ?  BOTTLES DRAWN AEROBIC AND ANAEROBIC Blood Culture results may not be optimal due to an excessive volume of blood received in culture bottles  ? Culture    ?  NO GROWTH 1 DAY ?Performed at [facility], [address], [place], [place] [address] ?  ? Report Status PENDING   ?Culture, blood (Routine X 2) w Reflex to ID Panel     Status: None (Preliminary result)  ? Collection Time: 06/16/21 11:45 PM  ? Specimen: BLOOD LEFT FOREARM  ?Result Value Ref Range  ? Specimen Description BLOOD LEFT FOREARM   ? Special Requests    ?  BOTTLES DRAWN AEROBIC AND ANAEROBIC Blood Culture results may not be optimal due to an excessive volume of blood received in culture bottles  ? Culture    ?  NO GROWTH 1 DAY ?Performed at [facility], [address], [place], [place] [address] ?  ? Report Status PENDING   ?Basic metabolic panel     Status: Abnormal  ? Collection Time: 06/17/21  3:54 AM  ?Result Value Ref Range  ? Sodium 142 135 - 145 mmol/L  ? Potassium 3.8 3.5 - 5.1 mmol/L  ? Chloride 115 (H) 98 - 111  mmol/L  ? CO2 21 (L) 22 - 32 mmol/L  ? Glucose, Bld 154 (H) 70 - 99 mg/dL  ?  Comment: Glucose reference range applies only to samples taken after fasting for at least 8 hours.  ? BUN 27 (H) 6 - 20 mg/dL

## 2021-06-18 NOTE — Plan of Care (Signed)

## 2021-06-18 NOTE — Progress Notes (Addendum)
Patient reports that she feels her UTI is getting worse. Expressed that there was blood in her urine again. Notified Dr. Maryland Pink of patient concern. ? ?It should be of note on patient pain reassessments, patient found to be resting in bed, playing on phone, or eating. Patient also joking and laughing with staff. Patient stated that post dilaudid she would briefly go to sleep, for around an hour, and then pain would return upon waking.  ?

## 2021-06-18 NOTE — Progress Notes (Signed)
Patient provided pain medications per order, requesting valium for anxiety. Explained that would need to wait in between administration due to amount of meds given. Expressed understanding. Patient having dinner at that time, dietary provided extras.  ?

## 2021-06-19 DIAGNOSIS — N12 Tubulo-interstitial nephritis, not specified as acute or chronic: Secondary | ICD-10-CM | POA: Diagnosis not present

## 2021-06-19 DIAGNOSIS — E1169 Type 2 diabetes mellitus with other specified complication: Secondary | ICD-10-CM | POA: Diagnosis not present

## 2021-06-19 DIAGNOSIS — N179 Acute kidney failure, unspecified: Secondary | ICD-10-CM | POA: Diagnosis not present

## 2021-06-19 DIAGNOSIS — E785 Hyperlipidemia, unspecified: Secondary | ICD-10-CM | POA: Diagnosis not present

## 2021-06-19 LAB — PROCALCITONIN: Procalcitonin: <0.1 ng/mL

## 2021-06-19 LAB — URINALYSIS, ROUTINE W REFLEX MICROSCOPIC
Bacteria, UA: NONE SEEN
Bilirubin Urine: NEGATIVE
Glucose, UA: NEGATIVE mg/dL
Ketones, ur: NEGATIVE mg/dL
Leukocytes,Ua: NEGATIVE
Nitrite: NEGATIVE
Protein, ur: 100 mg/dL — AB
RBC / HPF: >50 RBC/hpf — ABNORMAL HIGH (ref 0–5)
Specific Gravity, Urine: 1.015 (ref 1.005–1.030)
pH: 7 (ref 5.0–8.0)

## 2021-06-19 LAB — CBC
HCT: 32.2 % — ABNORMAL LOW (ref 36.0–46.0)
Hemoglobin: 10.3 g/dL — ABNORMAL LOW (ref 12.0–15.0)
MCH: 31.2 pg (ref 26.0–34.0)
MCHC: 32 g/dL (ref 30.0–36.0)
MCV: 97.6 fL (ref 80.0–100.0)
Platelets: 503 10*3/uL — ABNORMAL HIGH (ref 150–400)
RBC: 3.3 MIL/uL — ABNORMAL LOW (ref 3.87–5.11)
RDW: 17.2 % — ABNORMAL HIGH (ref 11.5–15.5)
WBC: 17.1 10*3/uL — ABNORMAL HIGH (ref 4.0–10.5)
nRBC: 0.2 % (ref 0.0–0.2)

## 2021-06-19 LAB — URINE CULTURE: Culture: 20000 — AB

## 2021-06-19 LAB — BASIC METABOLIC PANEL
Anion gap: 4 — ABNORMAL LOW (ref 5–15)
BUN: 18 mg/dL (ref 6–20)
CO2: 20 mmol/L — ABNORMAL LOW (ref 22–32)
Calcium: 8 mg/dL — ABNORMAL LOW (ref 8.9–10.3)
Chloride: 115 mmol/L — ABNORMAL HIGH (ref 98–111)
Creatinine, Ser: 0.74 mg/dL (ref 0.44–1.00)
GFR, Estimated: >60 mL/min (ref >60)
Glucose, Bld: 258 mg/dL — ABNORMAL HIGH (ref 70–99)
Potassium: 3.7 mmol/L (ref 3.5–5.1)
Sodium: 139 mmol/L (ref 135–145)

## 2021-06-19 LAB — GLUCOSE, CAPILLARY
Glucose-Capillary: 192 mg/dL — ABNORMAL HIGH (ref 70–99)
Glucose-Capillary: 176 mg/dL — ABNORMAL HIGH (ref 70–99)
Glucose-Capillary: 172 mg/dL — ABNORMAL HIGH (ref 70–99)
Glucose-Capillary: 196 mg/dL — ABNORMAL HIGH (ref 70–99)

## 2021-06-19 LAB — MAGNESIUM: Magnesium: 1.6 mg/dL — ABNORMAL LOW (ref 1.7–2.4)

## 2021-06-19 MED ORDER — MAGNESIUM SULFATE 2 GM/50ML IV SOLN
2.0000 g | Freq: Once | INTRAVENOUS | Status: AC
  Administered 2021-06-19: 2 g via INTRAVENOUS
  Filled 2021-06-19: qty 50

## 2021-06-19 NOTE — Progress Notes (Signed)
? ?[facility] ?PROGRESS NOTE ? ? ?Jamie Conley ZDG:[id number] DOB: 05/19/1975 DOA: 06/16/2021 ? ?PCP: Gildardo Pounds, NP ? ?Brief History/Interval Summary: 46 y.o. female with medical history significant of renal stones, T2DM, depression and hypertension who presented to the ED with refractive left flank pain. Patient had cystoscopy with left retrograde pyelography, left ureteroscopic stone manipulation with basket extraction, and left 6 x 26 JJ stent placement on 06/09/21. She was prescribed opioid analgesics for pain control. At home she continued to have persistent left flank pain, and her analgesic regimen was changed from hydrocodone to oxycodone, but her pain continued to be uncontrolled. She then was placed on hydromorphone 4 mg po that unfortunately was not effective in controlling her pain. Her urologist Dr. Alyson Ingles recommended her to come to the ED for further management.  Patient was found to have abnormal UA.  Her symptoms were thought to be suggestive of pyelonephritis.  She was hospitalized for further management. ? ?Consultants: Urology ? ?Procedures: None yet ? ? ? ?Subjective/Interval History: ?Patient mentions that she continues to have 8 out of 10 pain in the left flank.  No nausea or vomiting.  Mentions that she saw some blood in the urine.   ? ? ?Assessment/Plan: ? ?Acute pyelonephritis/sepsis present on admission ?Patient was noted to have tachycardia and leukocytosis at the time of admission. ?CT renal stone study showed left nephroureteral stent in place with decompression of the renal system.  No stone was noted.  Nonobstructing left nephrolithiasis was noted.  Left perinephric edema was appreciated.  No abscess was noted. ?Patient was started on ceftriaxone.   ?Urine culture positive for 20,000 colonies of E. coli.  Noted to be pansensitive. ?BC noted to be higher today.  However procalcitonin is less than 0.1.  Review of old labs show that her WBC has always been on  the higher side. ?Based on urology note does appear that patient is scheduled for stone surgery on Tuesday.  Her pain remains poorly controlled.  In view of this we will leave her on current antibiotics without any changes. ?Patient has been asking for more more pain medications.   ?[facility] prescriber database was reviewed.  Looks like because she has had multiple prescriptions for narcotics and Carisoprodol filled in the last 5 months.  Most recently she was prescribed 30 tablets of hydromorphone 4 mg each on May 10.  Prior to that she was prescribed 30 tablets of Percocet 10-3 25 on May 3.  30 tablets of Percocet again prescribed on April 28 and April 19.  Hydrocodone/acetaminophen prescribed on April 18, February 8.  Hydromorphone prescribed in December and November. ? ?Left renal stone ?Underwent cystoscopy, pyelogram, stone extraction and stent placement on 5/3 by Dr. Alyson Ingles.  Stable findings are noted on CT scan. ?Patient mentioned hematuria yesterday.  UA was repeated which does not show any change from previous sample.  Pinkish urine was noted yesterday evening.  Mild degree of hematuria is not surprising in a patient was a ureteral stent. ?Seen by Dr. Alyson Ingles with urology yesterday.  Looks like plan is for stone surgery on Tuesday.  Since her pain is inadequately controlled we will leave her in the hospital for now. ? ?Acute kidney injury ?Likely prerenal azotemia.  Creatinine was 1.31 at presentation.  Now back to normal.  Continue to monitor urine output. ? ?Diabetes mellitus type 2 with hyperlipidemia ?Uses insulin at home.  Currently on Levemir and SSI.  Monitor CBGs.  HbA1c 11.4 as of November  2022.  Noted to be 7.1 during this admission.  CBGs are reasonably well controlled. ?Continue statin. ? ?Abdominal wound skin wound ?Ulcerated skin lesion noted on abdominal wall.  Yellowish exudate noted.  No abscess reported on CT scan.  Use the antibiotic ointment 3 times a day. ? ?Normocytic  anemia ?Drop in hemoglobin noted.  Stable for the most part.  Hematuria unlikely to be significantly contributing to her anemia at this time. ? ?History of fibromyalgia ?Continue home medications.  Noted to be on buspirone, Lexapro, gabapentin and trazodone. ? ?Class III obesity ?Estimated body mass index is 43.97 kg/m? as calculated from the following: ?  Height as of this encounter: '5\' 3"'$  (1.6 m). ?  Weight as of this encounter: 112.6 kg. ? ? ?DVT Prophylaxis: Lovenox ?Code Status: Full code ?Family Communication: Discussed with patient ?Disposition Plan: Hopefully return home when improved.  Has been ambulating in the room. ? ?Status is: Inpatient ?Remains inpatient appropriate because: Acute pyelonephritis requiring IV antibiotics ? ? ? ? ? ?Medications: Scheduled: ? atorvastatin  40 mg Oral Daily  ? busPIRone  15 mg Oral BID  ? enoxaparin (LOVENOX) injection  40 mg Subcutaneous QHS  ? escitalopram  20 mg Oral Daily  ? gabapentin  300 mg Oral QPM  ? gabapentin  600 mg Oral q morning  ? insulin aspart  0-9 Units Subcutaneous TID WC  ? insulin detemir  10 Units Subcutaneous Q2200  ? neomycin-bacitracin-polymyxin   Topical TID  ? oxyCODONE  20 mg Oral Q12H  ? pantoprazole  40 mg Oral Daily  ? polyethylene glycol  17 g Oral Daily  ? tamsulosin  0.4 mg Oral QPC supper  ? traZODone  150 mg Oral QHS  ? ?Continuous: ? sodium chloride 50 mL/hr at 06/18/21 2051  ? cefTRIAXone (ROCEPHIN)  IV 1 g (06/18/21 2015)  ? ?RCV:[id number] **OR** acetaminophen, ALPRAZolam, diazepam, diphenhydrAMINE, HYDROmorphone (DILAUDID) injection, ondansetron **OR** ondansetron (ZOFRAN) IV, oxyCODONE ? ?Antibiotics: ?Anti-infectives (From admission, onward)  ? ? Start     Dose/Rate Route Frequency Ordered Stop  ? 06/17/21 2000  cefTRIAXone (ROCEPHIN) 1 g in sodium chloride 0.9 % 100 mL IVPB       ? 1 g ?200 mL/hr over 30 Minutes Intravenous Every 24 hours 06/16/21 2249    ? 06/16/21 2045  cefTRIAXone (ROCEPHIN) 1 g in sodium chloride 0.9 %  100 mL IVPB       ? 1 g ?200 mL/hr over 30 Minutes Intravenous  Once 06/16/21 2040 06/16/21 2128  ? ?  ? ? ?Objective: ? ?Vital Signs ? ?Vitals:  ? 06/18/21 1500 06/18/21 2207 06/19/21 0500 06/19/21 0545  ?BP: 115/69 117/86 104/69   ?Pulse: 83 84 74   ?Resp: '18 20 20   '$ ?Temp: 98.5 ?F (36.9 ?C) 99.5 ?F (37.5 ?C) 98.2 ?F (36.8 ?C)   ?TempSrc: Oral Oral Oral   ?SpO2: 99% 96% 98%   ?Weight:    112.6 kg  ?Height:      ? ? ?Intake/Output Summary (Last 24 hours) at 06/19/2021 0956 ?Last data filed at 06/18/2021 1700 ?Gross per 24 hour  ?Intake 560 ml  ?Output --  ?Net 560 ml  ? ? ?Filed Weights  ? 06/16/21 1558 06/16/21 2255 06/19/21 0545  ?Weight: 108.1 kg 108.5 kg 112.6 kg  ? ? ?General appearance: Awake alert.  In no distress ?Resp: Clear to auscultation bilaterally.  Normal effort ?Cardio: S1-S2 is normal regular.  No S3-S4.  No rubs murmurs or bruit ?GI: Abdomen is soft.  Tender in the left flank.  No rebound rigidity or guarding. ?Extremities: No edema.  Full range of motion of lower extremities. ?Neurologic: Alert and oriented x3.  No focal neurological deficits.  ? ? ? ?Lab Results: ? ?Data Reviewed: I have personally reviewed following labs and reports of the imaging studies ? ?CBC: ?Recent Labs  ?Lab 06/16/21 ?1632 06/17/21 ?0354 06/18/21 ?0421 06/19/21 ?0530  ?WBC 14.7* 13.2* 14.0* 17.1*  ?HGB 11.6* 10.2* 9.7* 10.3*  ?HCT 36.8 32.1* 30.3* 32.2*  ?MCV 96.1 96.1 96.5 97.6  ?PLT 536* 480* 459* 503*  ? ? ? ?Basic Metabolic Panel: ?Recent Labs  ?Lab 06/16/21 ?1632 06/17/21 ?0354 06/18/21 ?0421 06/19/21 ?0530  ?NA 142 142 139 139  ?K 4.2 3.8 3.5 3.7  ?CL 109 115* 115* 115*  ?CO2 23 21* 20* 20*  ?GLUCOSE 177* 154* 236* 258*  ?BUN 28* 27* 20 18  ?CREATININE 1.31* 1.13* 0.78 0.74  ?CALCIUM 9.2 8.2* 7.6* 8.0*  ?MG  --   --   --  1.6*  ? ? ? ?GFR: ?Estimated Creatinine Clearance: 106.1 mL/min (by C-G formula based on SCr of 0.74 mg/dL). ? ? ?CBG: ?Recent Labs  ?Lab 06/18/21 ?0756 06/18/21 ?1147 06/18/21 ?1655 06/18/21 ?2137  06/19/21 ?0757  ?GLUCAP 170* 174* 173* 139* 192*  ? ? ? ? ?Recent Results (from the past 240 hour(s))  ?Urine Culture     Status: Abnormal  ? Collection Time: 06/16/21  5:18 PM  ? Specimen: Urine, Clean Catch  ?

## 2021-06-19 NOTE — Progress Notes (Signed)
Patient has a wound on the lower part of her abdomen. The nurse yesterday marked it with a marker to show where the redness stopped. It has now started spreading past the line marked. MD Maryland Pink notified.  ?

## 2021-06-19 NOTE — Progress Notes (Signed)
Pt has had PRN med and schedule meds and is requesting more pain meds. Her vitals have been stable as well as her alertness. Pt has not been asleep all night despite of PRN meds. Pt states dilaudid is the only pain med working and states it works for 1-2 hours then need more and her pain level has maintained 9/10 on my shift. Also pt states she has been dealing with her condition for so long she has built up a high tolerance for certain medications. I will continue to monitor patient and her pain levels.   ?

## 2021-06-20 DIAGNOSIS — N12 Tubulo-interstitial nephritis, not specified as acute or chronic: Secondary | ICD-10-CM | POA: Diagnosis not present

## 2021-06-20 DIAGNOSIS — N179 Acute kidney failure, unspecified: Secondary | ICD-10-CM | POA: Diagnosis not present

## 2021-06-20 DIAGNOSIS — E785 Hyperlipidemia, unspecified: Secondary | ICD-10-CM | POA: Diagnosis not present

## 2021-06-20 DIAGNOSIS — E1169 Type 2 diabetes mellitus with other specified complication: Secondary | ICD-10-CM | POA: Diagnosis not present

## 2021-06-20 LAB — GLUCOSE, CAPILLARY
Glucose-Capillary: 204 mg/dL — ABNORMAL HIGH (ref 70–99)
Glucose-Capillary: 168 mg/dL — ABNORMAL HIGH (ref 70–99)
Glucose-Capillary: 201 mg/dL — ABNORMAL HIGH (ref 70–99)
Glucose-Capillary: 214 mg/dL — ABNORMAL HIGH (ref 70–99)

## 2021-06-20 LAB — PROCALCITONIN: Procalcitonin: <0.1 ng/mL

## 2021-06-20 NOTE — Progress Notes (Signed)
? ?[facility] ?PROGRESS NOTE ? ? ?Jamie Conley QQP:[id number] DOB: [birth year] DOA: 06/16/2021 ? ?PCP: Gildardo Pounds, NP ? ?Brief History/Interval Summary: 46 y.o. female with medical history significant of renal stones, T2DM, depression and hypertension who presented to the ED with refractive left flank pain. Patient had cystoscopy with left retrograde pyelography, left ureteroscopic stone manipulation with basket extraction, and left 6 x 26 JJ stent placement on 06/09/21. She was prescribed opioid analgesics for pain control. At home she continued to have persistent left flank pain, and her analgesic regimen was changed from hydrocodone to oxycodone, but her pain continued to be uncontrolled. She then was placed on hydromorphone 4 mg po that unfortunately was not effective in controlling her pain. Her urologist Dr. Alyson Ingles recommended her to come to the ED for further management.  Patient was found to have abnormal UA.  Her symptoms were thought to be suggestive of pyelonephritis.  She was hospitalized for further management. ? ?Consultants: Urology ? ?Procedures: None yet ? ? ? ?Subjective/Interval History: ?Patient continues to have 8 out of 10 pain in the left flank.  Quite tearful this morning.  Denies any nausea vomiting.    ? ? ?Assessment/Plan: ? ?Acute pyelonephritis/sepsis present on admission ?Patient was noted to have tachycardia and leukocytosis at the time of admission. ?CT renal stone study showed left nephroureteral stent in place with decompression of the renal system.  No stone was noted.  Nonobstructing left nephrolithiasis was noted.  Left perinephric edema was appreciated.  No abscess was noted. ?Patient was started on ceftriaxone.   ?Urine culture positive for 20,000 colonies of E. coli.  Noted to be pansensitive. ?WBC was noted to be higher yesterday however procalcitonin was less than 0.1.  Review of old labs show that her WBC has always been on the higher side. ?Based  on urology note it appears that patient is scheduled for stone surgery on Tuesday.  Her pain remains poorly controlled.  In view of this we will leave her on current antibiotics without any changes. ?Patient has been asking for more more pain medications.  Heart rate is noted to be normal.  Blood pressure is normal.  We will leave her on current regimen for now. ?[facility] prescriber database was reviewed.  Looks like she has had multiple prescriptions for narcotics and Carisoprodol filled in the last 5 months.  Most recently she was prescribed 30 tablets of hydromorphone 4 mg each on May 10.  Prior to that she was prescribed 30 tablets of Percocet 10-3 25 on May 3.  30 tablets of Percocet again prescribed on April 28 and April 19.  Hydrocodone/acetaminophen prescribed on April 18, February 8.  Hydromorphone prescribed in December and November. ? ?Left renal stone ?Underwent cystoscopy, pyelogram, stone extraction and stent placement on 5/3 by Dr. Alyson Ingles.  Stable findings are noted on CT scan. ?Patient mentioned hematuria on 5/12.  UA was repeated which does not show any change from previous sample.  Pinkish urine was documented by nursing staff.  Mild degree of hematuria is not surprising in a patient with a ureteral stent. ?Seen by Dr. Alyson Ingles with urology.  Looks like plan is for stone surgery on Tuesday.  Since her pain is inadequately controlled we will leave her in the hospital for now. ? ?Acute kidney injury ?Likely prerenal azotemia.  Creatinine was 1.31 at presentation.  Now back to normal.  Continue to monitor urine output.  Recheck labs tomorrow. ? ?Diabetes mellitus type 2 with hyperlipidemia ?Uses  insulin at home.  Currently on Levemir and SSI.  Monitor CBGs.  HbA1c 11.4 as of November 2022.  Noted to be 7.1 during this admission.  CBGs are reasonably well controlled. ?Continue statin. ? ?Abdominal wound skin wound ?Ulcerated skin lesion noted on abdominal wall.  Yellowish exudate was noted.  No  abscess reported on CT scan.  Antibiotic ointment 3 times a day.  Wound reexamined today.  The infection part seems to be better but it is oozing some blood.  Will ask nursing staff to apply pressure dressing. ? ?Normocytic anemia ?Drop in hemoglobin noted.  Stable for the most part.  Hematuria unlikely to be significantly contributing to her anemia at this time.  Recheck labs tomorrow. ? ?History of fibromyalgia ?Continue home medications.  Noted to be on buspirone, Lexapro, gabapentin and trazodone. ? ?Class III obesity ?Estimated body mass index is 43.97 kg/m? as calculated from the following: ?  Height as of this encounter: '5\' 3"'$  (1.6 m). ?  Weight as of this encounter: 112.6 kg. ? ? ?DVT Prophylaxis: Lovenox ?Code Status: Full code ?Family Communication: Discussed with patient ?Disposition Plan: Hopefully return home when improved.  Has been ambulating in the room. ? ?Status is: Inpatient ?Remains inpatient appropriate because: Acute pyelonephritis requiring IV antibiotics ? ? ? ? ? ?Medications: Scheduled: ? atorvastatin  40 mg Oral Daily  ? busPIRone  15 mg Oral BID  ? enoxaparin (LOVENOX) injection  40 mg Subcutaneous QHS  ? escitalopram  20 mg Oral Daily  ? gabapentin  300 mg Oral QPM  ? gabapentin  600 mg Oral q morning  ? insulin aspart  0-9 Units Subcutaneous TID WC  ? insulin detemir  10 Units Subcutaneous Q2200  ? neomycin-bacitracin-polymyxin   Topical TID  ? oxyCODONE  20 mg Oral Q12H  ? pantoprazole  40 mg Oral Daily  ? polyethylene glycol  17 g Oral Daily  ? tamsulosin  0.4 mg Oral QPC supper  ? traZODone  150 mg Oral QHS  ? ?Continuous: ? sodium chloride 50 mL/hr (06/19/21 2227)  ? cefTRIAXone (ROCEPHIN)  IV 1 g (06/19/21 2003)  ? ?NID:[id number] **OR** acetaminophen, ALPRAZolam, diazepam, diphenhydrAMINE, HYDROmorphone (DILAUDID) injection, ondansetron **OR** ondansetron (ZOFRAN) IV, oxyCODONE ? ?Antibiotics: ?Anti-infectives (From admission, onward)  ? ? Start     Dose/Rate Route Frequency  Ordered Stop  ? 06/17/21 2000  cefTRIAXone (ROCEPHIN) 1 g in sodium chloride 0.9 % 100 mL IVPB       ? 1 g ?200 mL/hr over 30 Minutes Intravenous Every 24 hours 06/16/21 2249    ? 06/16/21 2045  cefTRIAXone (ROCEPHIN) 1 g in sodium chloride 0.9 % 100 mL IVPB       ? 1 g ?200 mL/hr over 30 Minutes Intravenous  Once 06/16/21 2040 06/16/21 2128  ? ?  ? ? ?Objective: ? ?Vital Signs ? ?Vitals:  ? 06/19/21 1427 06/19/21 1428 06/19/21 2137 06/20/21 0546  ?BP: 130/72 130/72 121/72 109/74  ?Pulse: 88 88 92 93  ?Resp: '16 16  16  '$ ?Temp: 98.1 ?F (36.7 ?C) 98.1 ?F (36.7 ?C) 98.8 ?F (37.1 ?C) 99.2 ?F (37.3 ?C)  ?TempSrc: Oral Oral Oral Oral  ?SpO2: 97% 97% 97% 97%  ?Weight:      ?Height:      ? ? ?Intake/Output Summary (Last 24 hours) at 06/20/2021 0949 ?Last data filed at 06/20/2021 0500 ?Gross per 24 hour  ?Intake 1420 ml  ?Output --  ?Net 1420 ml  ? ? ?Filed Weights  ? 06/16/21 1558  06/16/21 2255 06/19/21 0545  ?Weight: 108.1 kg 108.5 kg 112.6 kg  ? ? ?General appearance: Awake alert.  In no distress ?Resp: Clear to auscultation bilaterally.  Normal effort ?Cardio: S1-S2 is normal regular.  No S3-S4.  No rubs murmurs or bruit ?GI: Abdomen is soft.  Tender in the left flank area.  Bowel sounds present.  No masses organomegaly ?The wound on her anterior abdomen near the suprapubic area seems to be stable as far as infection is concerned.  Not much yellowish exudate noted today compared to 3 days ago.  Some oozing of blood is noted. ?Extremities: No edema.  Full range of motion of lower extremities. ?Neurologic: Alert and oriented x3.  No focal neurological deficits.  ? ? ? ? ?Lab Results: ? ?Data Reviewed: I have personally reviewed following labs and reports of the imaging studies ? ?CBC: ?Recent Labs  ?Lab 06/16/21 ?1632 06/17/21 ?0354 06/18/21 ?0421 06/19/21 ?0530  ?WBC 14.7* 13.2* 14.0* 17.1*  ?HGB 11.6* 10.2* 9.7* 10.3*  ?HCT 36.8 32.1* 30.3* 32.2*  ?MCV 96.1 96.1 96.5 97.6  ?PLT 536* 480* 459* 503*  ? ? ? ?Basic Metabolic  Panel: ?Recent Labs  ?Lab 06/16/21 ?1632 06/17/21 ?0354 06/18/21 ?0421 06/19/21 ?0530  ?NA 142 142 139 139  ?K 4.2 3.8 3.5 3.7  ?CL 109 115* 115* 115*  ?CO2 23 21* 20* 20*  ?GLUCOSE 177* 154* 236* 258*  ?

## 2021-06-20 NOTE — Plan of Care (Signed)
?  Problem: Coping: ?Goal: Level of anxiety will decrease ?Outcome: Progressing ?  ?Problem: Pain Managment: ?Goal: General experience of comfort will improve ?Outcome: Not Progressing ?  ?Patient education provided regarding secondary effects of pain medication. Such as constipation. Alternate interventions were discussed with patient. ?

## 2021-06-21 DIAGNOSIS — E785 Hyperlipidemia, unspecified: Secondary | ICD-10-CM | POA: Diagnosis not present

## 2021-06-21 DIAGNOSIS — N12 Tubulo-interstitial nephritis, not specified as acute or chronic: Secondary | ICD-10-CM | POA: Diagnosis not present

## 2021-06-21 DIAGNOSIS — N179 Acute kidney failure, unspecified: Secondary | ICD-10-CM | POA: Diagnosis not present

## 2021-06-21 DIAGNOSIS — E1169 Type 2 diabetes mellitus with other specified complication: Secondary | ICD-10-CM | POA: Diagnosis not present

## 2021-06-21 LAB — CBC
HCT: 33.1 % — ABNORMAL LOW (ref 36.0–46.0)
Hemoglobin: 10.3 g/dL — ABNORMAL LOW (ref 12.0–15.0)
MCH: 30.4 pg (ref 26.0–34.0)
MCHC: 31.1 g/dL (ref 30.0–36.0)
MCV: 97.6 fL (ref 80.0–100.0)
Platelets: 642 10*3/uL — ABNORMAL HIGH (ref 150–400)
RBC: 3.39 MIL/uL — ABNORMAL LOW (ref 3.87–5.11)
RDW: 17.4 % — ABNORMAL HIGH (ref 11.5–15.5)
WBC: 17.9 10*3/uL — ABNORMAL HIGH (ref 4.0–10.5)
nRBC: 0.4 % — ABNORMAL HIGH (ref 0.0–0.2)

## 2021-06-21 LAB — BASIC METABOLIC PANEL
Anion gap: 7 (ref 5–15)
BUN: 15 mg/dL (ref 6–20)
CO2: 23 mmol/L (ref 22–32)
Calcium: 8.8 mg/dL — ABNORMAL LOW (ref 8.9–10.3)
Chloride: 111 mmol/L (ref 98–111)
Creatinine, Ser: 0.69 mg/dL (ref 0.44–1.00)
GFR, Estimated: >60 mL/min (ref >60)
Glucose, Bld: 220 mg/dL — ABNORMAL HIGH (ref 70–99)
Potassium: 3.8 mmol/L (ref 3.5–5.1)
Sodium: 141 mmol/L (ref 135–145)

## 2021-06-21 LAB — GLUCOSE, CAPILLARY
Glucose-Capillary: 209 mg/dL — ABNORMAL HIGH (ref 70–99)
Glucose-Capillary: 201 mg/dL — ABNORMAL HIGH (ref 70–99)
Glucose-Capillary: 180 mg/dL — ABNORMAL HIGH (ref 70–99)
Glucose-Capillary: 216 mg/dL — ABNORMAL HIGH (ref 70–99)

## 2021-06-21 NOTE — Progress Notes (Signed)
? ?[facility] ?PROGRESS NOTE ? ? ?Zenab Gronewold XKG:[id number] DOB: [birth year] DOA: 06/16/2021 ? ?PCP: Gildardo Pounds, NP ? ?Brief History/Interval Summary: 46 y.o. female with medical history significant of renal stones, T2DM, depression and hypertension who presented to the ED with refractive left flank pain. Patient had cystoscopy with left retrograde pyelography, left ureteroscopic stone manipulation with basket extraction, and left 6 x 26 JJ stent placement on 06/09/21. She was prescribed opioid analgesics for pain control. At home she continued to have persistent left flank pain, and her analgesic regimen was changed from hydrocodone to oxycodone, but her pain continued to be uncontrolled. She then was placed on hydromorphone 4 mg po that unfortunately was not effective in controlling her pain. Her urologist Dr. Alyson Ingles recommended her to come to the ED for further management.  Patient was found to have abnormal UA.  Her symptoms were thought to be suggestive of pyelonephritis.  She was hospitalized for further management. ? ?Consultants: Urology ? ?Procedures: None yet ? ? ? ?Subjective/Interval History: ?Patient continues to have 7 out of 10 pain in the left flank.  Slightly better this morning compared to yesterday.  Wound over the abdomen is doing better after pressure dressing was applied.   ? ? ?Assessment/Plan: ? ?Acute pyelonephritis/sepsis present on admission ?Patient was noted to have tachycardia and leukocytosis at the time of admission. ?CT renal stone study showed left nephroureteral stent in place with decompression of the renal system.  No stone was noted.  Nonobstructing left nephrolithiasis was noted.  Left perinephric edema was appreciated.  No abscess was noted. ?Patient was started on ceftriaxone.   ?Urine culture positive for 20,000 colonies of E. coli.  Noted to be pansensitive. ?Since there is plan to do procedure tomorrow we will continue with ceftriaxone. ?WBC  remains persistently elevated though she is afebrile.  Her procalcitonin level is less than 0.1. ?Based on urology note patient is scheduled for stone surgery on Tuesday.   ?Patient has been asking for more more pain medications.  Heart rate is noted to be normal.  Blood pressure is normal.  We will leave her on current regimen for now. ?[facility] prescriber database was reviewed.  Looks like she has had multiple prescriptions for narcotics and Carisoprodol filled in the last 5 months.  Most recently she was prescribed 30 tablets of hydromorphone 4 mg each on May 10.  Prior to that she was prescribed 30 tablets of Percocet 10-3 25 on May 3.  30 tablets of Percocet again prescribed on April 28 and April 19.  Hydrocodone/acetaminophen prescribed on April 18, February 8.  Hydromorphone prescribed in December and November. ? ?Left renal stone ?Underwent cystoscopy, pyelogram, stone extraction and stent placement on 5/3 by Dr. Alyson Ingles.  Stable findings are noted on CT scan. ?Patient mentioned hematuria on 5/12.  UA was repeated which does not show any change from previous sample.  Pinkish urine was documented by nursing staff.  Mild degree of hematuria is not surprising in a patient with a ureteral stent. ?Seen by Dr. Alyson Ingles with urology.  Looks like plan is for stone surgery on Tuesday.  Since she continues to require frequent parenteral narcotics we decided to leave her in the hospital to the procedure is done.   ? ?Acute kidney injury ?Likely prerenal azotemia.  Creatinine was 1.31 at presentation.  Now back to normal.  Continue to monitor urine output.  Recheck labs tomorrow. ? ?Diabetes mellitus type 2 with hyperlipidemia ?Uses insulin at home.  Currently on Levemir and SSI.  Monitor CBGs.  HbA1c 11.4 as of November 2022.  Noted to be 7.1 during this admission.  CBGs noted to be elevated in the last 24 hours.  Since she will likely be n.p.o. past midnight we will keep her on current dose for now.  Continue  statin. ? ?Abdominal wound skin wound ?Ulcerated skin lesion noted on abdominal wall.  Yellowish exudate was noted.  No abscess reported on CT scan.  Antibiotic ointment 3 times a day.   ?Some oozing of blood was noted from the wound yesterday.  Pressure dressing was applied which seems to be helping.  We will reexamine the wound tomorrow.   ? ?Normocytic anemia ?Drop in hemoglobin noted.  Stable for the most part.   ? ?History of fibromyalgia ?Continue home medications.  Noted to be on buspirone, Lexapro, gabapentin and trazodone. ? ?Class III obesity ?Estimated body mass index is 43.97 kg/m? as calculated from the following: ?  Height as of this encounter: '5\' 3"'$  (1.6 m). ?  Weight as of this encounter: 112.6 kg. ? ? ?DVT Prophylaxis: Lovenox ?Code Status: Full code ?Family Communication: Discussed with patient ?Disposition Plan: Hopefully return home when improved.  Has been ambulating in the room. ? ?Status is: Inpatient ?Remains inpatient appropriate because: Acute pyelonephritis requiring IV antibiotics ? ? ? ? ? ?Medications: Scheduled: ? atorvastatin  40 mg Oral Daily  ? busPIRone  15 mg Oral BID  ? enoxaparin (LOVENOX) injection  40 mg Subcutaneous QHS  ? escitalopram  20 mg Oral Daily  ? gabapentin  300 mg Oral QPM  ? gabapentin  600 mg Oral q morning  ? insulin aspart  0-9 Units Subcutaneous TID WC  ? insulin detemir  10 Units Subcutaneous Q2200  ? neomycin-bacitracin-polymyxin   Topical TID  ? oxyCODONE  20 mg Oral Q12H  ? pantoprazole  40 mg Oral Daily  ? polyethylene glycol  17 g Oral Daily  ? tamsulosin  0.4 mg Oral QPC supper  ? traZODone  150 mg Oral QHS  ? ?Continuous: ? sodium chloride 50 mL/hr at 06/20/21 1444  ? cefTRIAXone (ROCEPHIN)  IV 1 g (06/20/21 1937)  ? ?MAU:[id number] **OR** acetaminophen, ALPRAZolam, diazepam, diphenhydrAMINE, HYDROmorphone (DILAUDID) injection, ondansetron **OR** ondansetron (ZOFRAN) IV, oxyCODONE ? ?Antibiotics: ?Anti-infectives (From admission, onward)  ? ? Start      Dose/Rate Route Frequency Ordered Stop  ? 06/17/21 2000  cefTRIAXone (ROCEPHIN) 1 g in sodium chloride 0.9 % 100 mL IVPB       ? 1 g ?200 mL/hr over 30 Minutes Intravenous Every 24 hours 06/16/21 2249    ? 06/16/21 2045  cefTRIAXone (ROCEPHIN) 1 g in sodium chloride 0.9 % 100 mL IVPB       ? 1 g ?200 mL/hr over 30 Minutes Intravenous  Once 06/16/21 2040 06/16/21 2128  ? ?  ? ? ?Objective: ? ?Vital Signs ? ?Vitals:  ? 06/20/21 0546 06/20/21 1552 06/20/21 2043 06/21/21 0353  ?BP: 109/74 110/66 (!) 111/52 121/77  ?Pulse: 93 82 86 74  ?Resp: '16 20 18 19  '$ ?Temp: 99.2 ?F (37.3 ?C) 98.1 ?F (36.7 ?C) 98.4 ?F (36.9 ?C) 97.7 ?F (36.5 ?C)  ?TempSrc: Oral Oral Oral Oral  ?SpO2: 97% 96% 96% 100%  ?Weight:      ?Height:      ? ? ?Intake/Output Summary (Last 24 hours) at 06/21/2021 1019 ?Last data filed at 06/20/2021 1700 ?Gross per 24 hour  ?Intake 360 ml  ?Output --  ?Net  360 ml  ? ? ?Filed Weights  ? 06/16/21 1558 06/16/21 2255 06/19/21 0545  ?Weight: 108.1 kg 108.5 kg 112.6 kg  ? ? ?General appearance: Awake alert.  In no distress ?Resp: Clear to auscultation bilaterally.  Normal effort ?Cardio: S1-S2 is normal regular.  No S3-S4.  No rubs murmurs or bruit ?GI: Abdomen is soft.  Tender in the left flank area.  Bowel sounds present.  No masses organomegaly. ?Extremities: No edema.  Full range of motion of lower extremities. ?Neurologic: Alert and oriented x3.  No focal neurological deficits.  ? ? ? ? ?Lab Results: ? ?Data Reviewed: I have personally reviewed following labs and reports of the imaging studies ? ?CBC: ?Recent Labs  ?Lab 06/16/21 ?1632 06/17/21 ?0354 06/18/21 ?0421 06/19/21 ?0530 06/21/21 ?0441  ?WBC 14.7* 13.2* 14.0* 17.1* 17.9*  ?HGB 11.6* 10.2* 9.7* 10.3* 10.3*  ?HCT 36.8 32.1* 30.3* 32.2* 33.1*  ?MCV 96.1 96.1 96.5 97.6 97.6  ?PLT 536* 480* 459* 503* 642*  ? ? ? ?Basic Metabolic Panel: ?Recent Labs  ?Lab 06/16/21 ?1632 06/17/21 ?0354 06/18/21 ?0421 06/19/21 ?0530 06/21/21 ?0441  ?NA 142 142 139 139 141  ?K 4.2  3.8 3.5 3.7 3.8  ?CL 109 115* 115* 115* 111  ?CO2 23 21* 20* 20* 23  ?GLUCOSE 177* 154* 236* 258* 220*  ?BUN 28* 27* '20 18 15  '$ ?CREATININE 1.31* 1.13* 0.78 0.74 0.69  ?CALCIUM 9.2 8.2* 7.6* 8.0* 8.8*  ?M

## 2021-06-21 NOTE — Anesthesia Preprocedure Evaluation (Addendum)
Anesthesia Evaluation  ?Patient identified by MRN, date of birth, ID band ?Patient awake ? ? ? ?Reviewed: ?Allergy & Precautions, NPO status , Patient's Chart, lab work & pertinent test results ? ?Airway ?Mallampati: II ? ?TM Distance: >3 FB ?Neck ROM: Full ? ? ? Dental ? ?(+) Missing, Poor Dentition, Edentulous Upper, Chipped ?  ?Pulmonary ?neg pulmonary ROS, asthma ,  ?  ?Pulmonary exam normal ?breath sounds clear to auscultation ? ? ? ? ? ? Cardiovascular ?hypertension, Pt. on medications ?Normal cardiovascular exam ?Rhythm:Regular Rate:Normal ? ? ?  ?Neuro/Psych ? Headaches, PSYCHIATRIC DISORDERS Anxiety Depression  Neuromuscular disease negative neurological ROS ? negative psych ROS  ? GI/Hepatic ?negative GI ROS, Neg liver ROS,   ?Endo/Other  ?diabetes, Well Controlled, Type 2, Oral Hypoglycemic Agents, Insulin DependentMorbid obesity ? Renal/GU ?Renal disease  ?negative genitourinary ?  ?Musculoskeletal ? ?(+) Fibromyalgia -, narcotic dependent ? Abdominal ?(+) + obese,  ?Abdomen: soft. ? ?  ?Peds ?negative pediatric ROS ?(+)  Hematology ?negative hematology ROS ?(+)   ?Anesthesia Other Findings ? ? Reproductive/Obstetrics ?negative OB ROS ? ?  ? ? ? ? ? ? ? ? ? ? ? ? ? ?  ?  ? ? ? ? ? ? ?Anesthesia Physical ?Anesthesia Plan ? ?ASA: 3 ? ?Anesthesia Plan: General  ? ?Post-op Pain Management: Dilaudid IV  ? ?Induction: Intravenous ? ?PONV Risk Score and Plan: 4 or greater and Ondansetron ? ?Airway Management Planned: Oral ETT ? ?Additional Equipment:  ? ?Intra-op Plan:  ? ?Post-operative Plan: Extubation in OR ? ?Informed Consent: I have reviewed the patients History and Physical, chart, labs and discussed the procedure including the risks, benefits and alternatives for the proposed anesthesia with the patient or authorized representative who has indicated his/her understanding and acceptance.  ? ? ? ?Dental advisory given ? ?Plan Discussed with: CRNA and Surgeon ? ?Anesthesia  Plan Comments: (Patient was sleeping and unable to finish pre op evaluation, please keep patient NPO after midnight except medications and anesthesia will evaluate tomorrow)  ? ? ? ? ?Anesthesia Quick Evaluation ? ?

## 2021-06-22 ENCOUNTER — Encounter (HOSPITAL_COMMUNITY)
Admission: EM | Disposition: A | Discharge status: Home / Self Care | Payer: Self-pay | Source: Home / Self Care | Attending: Internal Medicine

## 2021-06-22 ENCOUNTER — Inpatient Hospital Stay (HOSPITAL_COMMUNITY): Payer: 59 | Admitting: Anesthesiology

## 2021-06-22 ENCOUNTER — Inpatient Hospital Stay: Admit: 2021-06-22 | Payer: 59 | Admitting: Urology

## 2021-06-22 ENCOUNTER — Inpatient Hospital Stay (HOSPITAL_COMMUNITY): Payer: 59

## 2021-06-22 DIAGNOSIS — E119 Type 2 diabetes mellitus without complications: Secondary | ICD-10-CM

## 2021-06-22 DIAGNOSIS — I10 Essential (primary) hypertension: Secondary | ICD-10-CM

## 2021-06-22 DIAGNOSIS — N179 Acute kidney failure, unspecified: Secondary | ICD-10-CM | POA: Diagnosis not present

## 2021-06-22 DIAGNOSIS — N12 Tubulo-interstitial nephritis, not specified as acute or chronic: Secondary | ICD-10-CM | POA: Diagnosis not present

## 2021-06-22 DIAGNOSIS — E1169 Type 2 diabetes mellitus with other specified complication: Secondary | ICD-10-CM | POA: Diagnosis not present

## 2021-06-22 DIAGNOSIS — E785 Hyperlipidemia, unspecified: Secondary | ICD-10-CM | POA: Diagnosis not present

## 2021-06-22 DIAGNOSIS — N200 Calculus of kidney: Secondary | ICD-10-CM | POA: Diagnosis not present

## 2021-06-22 DIAGNOSIS — Z794 Long term (current) use of insulin: Secondary | ICD-10-CM

## 2021-06-22 DIAGNOSIS — N201 Calculus of ureter: Secondary | ICD-10-CM

## 2021-06-22 HISTORY — PX: CYSTOSCOPY WITH RETROGRADE PYELOGRAM, URETEROSCOPY AND STENT PLACEMENT: SHX5789

## 2021-06-22 LAB — BASIC METABOLIC PANEL
Anion gap: 6 (ref 5–15)
BUN: 15 mg/dL (ref 6–20)
CO2: 26 mmol/L (ref 22–32)
Calcium: 8.7 mg/dL — ABNORMAL LOW (ref 8.9–10.3)
Chloride: 110 mmol/L (ref 98–111)
Creatinine, Ser: 0.72 mg/dL (ref 0.44–1.00)
GFR, Estimated: >60 mL/min (ref >60)
Glucose, Bld: 221 mg/dL — ABNORMAL HIGH (ref 70–99)
Potassium: 3.5 mmol/L (ref 3.5–5.1)
Sodium: 142 mmol/L (ref 135–145)

## 2021-06-22 LAB — CBC
HCT: 32.1 % — ABNORMAL LOW (ref 36.0–46.0)
Hemoglobin: 10.3 g/dL — ABNORMAL LOW (ref 12.0–15.0)
MCH: 31 pg (ref 26.0–34.0)
MCHC: 32.1 g/dL (ref 30.0–36.0)
MCV: 96.7 fL (ref 80.0–100.0)
Platelets: 679 10*3/uL — ABNORMAL HIGH (ref 150–400)
RBC: 3.32 MIL/uL — ABNORMAL LOW (ref 3.87–5.11)
RDW: 17.4 % — ABNORMAL HIGH (ref 11.5–15.5)
WBC: 17.6 10*3/uL — ABNORMAL HIGH (ref 4.0–10.5)
nRBC: 0.6 % — ABNORMAL HIGH (ref 0.0–0.2)

## 2021-06-22 LAB — GLUCOSE, CAPILLARY
Glucose-Capillary: 223 mg/dL — ABNORMAL HIGH (ref 70–99)
Glucose-Capillary: 191 mg/dL — ABNORMAL HIGH (ref 70–99)
Glucose-Capillary: 200 mg/dL — ABNORMAL HIGH (ref 70–99)
Glucose-Capillary: 252 mg/dL — ABNORMAL HIGH (ref 70–99)

## 2021-06-22 LAB — CULTURE, BLOOD (ROUTINE X 2)
Culture: NO GROWTH
Culture: NO GROWTH

## 2021-06-22 LAB — SURGICAL PCR SCREEN
MRSA, PCR: NEGATIVE
Staphylococcus aureus: NEGATIVE

## 2021-06-22 SURGERY — CYSTOURETEROSCOPY, WITH RETROGRADE PYELOGRAM AND STENT INSERTION
Anesthesia: General | Site: Ureter | Laterality: Left

## 2021-06-22 MED ORDER — MEPERIDINE HCL 50 MG/ML IJ SOLN: 6.2500 mg | INTRAMUSCULAR | Status: DC | PRN

## 2021-06-22 MED ORDER — PROPOFOL 10 MG/ML IV BOLUS
INTRAVENOUS | Status: AC
  Filled 2021-06-22: qty 20

## 2021-06-22 MED ORDER — FENTANYL CITRATE (PF) 100 MCG/2ML IJ SOLN
INTRAMUSCULAR | Status: AC
  Filled 2021-06-22: qty 2

## 2021-06-22 MED ORDER — DEXAMETHASONE SODIUM PHOSPHATE 10 MG/ML IJ SOLN
INTRAMUSCULAR | Status: DC | PRN
  Administered 2021-06-22: 10 mg via INTRAVENOUS

## 2021-06-22 MED ORDER — CHLORHEXIDINE GLUCONATE 0.12 % MT SOLN
15.0000 mL | Freq: Once | OROMUCOSAL | Status: AC
  Administered 2021-06-22: 15 mL via OROMUCOSAL

## 2021-06-22 MED ORDER — LIDOCAINE HCL (CARDIAC) PF 100 MG/5ML IV SOSY
PREFILLED_SYRINGE | INTRAVENOUS | Status: DC | PRN
  Administered 2021-06-22: 100 mg via INTRAVENOUS

## 2021-06-22 MED ORDER — DIATRIZOATE MEGLUMINE 30 % UR SOLN
URETHRAL | Status: DC | PRN
  Administered 2021-06-22: 10 mL via URETHRAL

## 2021-06-22 MED ORDER — MIDAZOLAM HCL 5 MG/5ML IJ SOLN
INTRAMUSCULAR | Status: DC | PRN
  Administered 2021-06-22: 2 mg via INTRAVENOUS

## 2021-06-22 MED ORDER — LIDOCAINE HCL (PF) 2 % IJ SOLN
INTRAMUSCULAR | Status: AC
  Filled 2021-06-22: qty 5

## 2021-06-22 MED ORDER — CEFAZOLIN SODIUM-DEXTROSE 2-4 GM/100ML-% IV SOLN
INTRAVENOUS | Status: AC
Start: 2021-06-22 — End: 2021-06-22
  Filled 2021-06-22: qty 100

## 2021-06-22 MED ORDER — SUGAMMADEX SODIUM 200 MG/2ML IV SOLN
INTRAVENOUS | Status: DC | PRN
  Administered 2021-06-22: 200 mg via INTRAVENOUS

## 2021-06-22 MED ORDER — CEFAZOLIN SODIUM-DEXTROSE 2-4 GM/100ML-% IV SOLN
2.0000 g | INTRAVENOUS | Status: AC
  Administered 2021-06-22: 2 g via INTRAVENOUS

## 2021-06-22 MED ORDER — ONDANSETRON HCL 4 MG/2ML IJ SOLN
INTRAMUSCULAR | Status: AC
  Filled 2021-06-22: qty 2

## 2021-06-22 MED ORDER — KETOROLAC TROMETHAMINE 30 MG/ML IJ SOLN
INTRAMUSCULAR | Status: DC | PRN
  Administered 2021-06-22: 30 mg via INTRAVENOUS

## 2021-06-22 MED ORDER — LACTATED RINGERS IV SOLN: INTRAVENOUS | Status: DC

## 2021-06-22 MED ORDER — PROPOFOL 10 MG/ML IV BOLUS
INTRAVENOUS | Status: DC | PRN
  Administered 2021-06-22: 200 mg via INTRAVENOUS

## 2021-06-22 MED ORDER — ORAL CARE MOUTH RINSE: 15.0000 mL | Freq: Once | OROMUCOSAL | Status: AC

## 2021-06-22 MED ORDER — SODIUM CHLORIDE 0.9 % IR SOLN
Status: DC | PRN
  Administered 2021-06-22 (×2): 3000 mL

## 2021-06-22 MED ORDER — POTASSIUM CHLORIDE CRYS ER 20 MEQ PO TBCR
40.0000 meq | EXTENDED_RELEASE_TABLET | Freq: Once | ORAL | Status: DC
Start: 2021-06-22 — End: 2021-06-23

## 2021-06-22 MED ORDER — MORPHINE SULFATE (PF) 2 MG/ML IV SOLN
2.0000 mg | INTRAVENOUS | Status: DC | PRN
  Administered 2021-06-22 (×3): 2 mg via INTRAVENOUS
  Filled 2021-06-22 (×3): qty 1

## 2021-06-22 MED ORDER — MIDAZOLAM HCL 2 MG/2ML IJ SOLN
INTRAMUSCULAR | Status: AC
  Filled 2021-06-22: qty 2

## 2021-06-22 MED ORDER — WATER FOR IRRIGATION, STERILE IR SOLN
Status: DC | PRN
  Administered 2021-06-22: 500 mL

## 2021-06-22 MED ORDER — FENTANYL CITRATE (PF) 100 MCG/2ML IJ SOLN
INTRAMUSCULAR | Status: DC | PRN
  Administered 2021-06-22 (×2): 100 ug via INTRAVENOUS

## 2021-06-22 MED ORDER — DEXAMETHASONE SODIUM PHOSPHATE 10 MG/ML IJ SOLN
INTRAMUSCULAR | Status: AC
  Filled 2021-06-22: qty 1

## 2021-06-22 MED ORDER — KETOROLAC TROMETHAMINE 30 MG/ML IJ SOLN
INTRAMUSCULAR | Status: AC
  Filled 2021-06-22: qty 1

## 2021-06-22 MED ORDER — HYDROMORPHONE HCL 1 MG/ML IJ SOLN
0.2500 mg | INTRAMUSCULAR | Status: DC | PRN
  Administered 2021-06-22: 0.5 mg via INTRAVENOUS
  Filled 2021-06-22: qty 0.5

## 2021-06-22 MED ORDER — DIATRIZOATE MEGLUMINE 30 % UR SOLN
URETHRAL | Status: AC
  Filled 2021-06-22: qty 100

## 2021-06-22 MED ORDER — ONDANSETRON HCL 4 MG/2ML IJ SOLN
INTRAMUSCULAR | Status: DC | PRN
  Administered 2021-06-22: 4 mg via INTRAVENOUS

## 2021-06-22 SURGICAL SUPPLY — 27 items
BAG DRAIN URO TABLE W/ADPT NS (BAG) ×3 IMPLANT
BAG DRN 8 ADPR NS SKTRN CSTL (BAG) ×2
BAG HAMPER (MISCELLANEOUS) ×3 IMPLANT
BASKET ZERO TIP NITINOL 2.4FR (BASKET) ×2 IMPLANT
BSKT STON RTRVL ZERO TP 2.4FR (BASKET) ×2
CATH INTERMIT  6FR 70CM (CATHETERS) ×3 IMPLANT
CLOTH BEACON ORANGE TIMEOUT ST (SAFETY) ×3 IMPLANT
DECANTER SPIKE VIAL GLASS SM (MISCELLANEOUS) ×3 IMPLANT
EXTRACTOR STONE NITINOL NGAGE (UROLOGICAL SUPPLIES) ×4 IMPLANT
GLOVE BIO SURGEON STRL SZ8 (GLOVE) ×3 IMPLANT
GLOVE BIOGEL PI IND STRL 7.0 (GLOVE) ×4 IMPLANT
GLOVE BIOGEL PI INDICATOR 7.0 (GLOVE) ×2
GOWN STRL REUS W/TWL LRG LVL3 (GOWN DISPOSABLE) ×3 IMPLANT
GOWN STRL REUS W/TWL XL LVL3 (GOWN DISPOSABLE) ×3 IMPLANT
GUIDEWIRE STR DUAL SENSOR (WIRE) ×3 IMPLANT
GUIDEWIRE STR ZIPWIRE 035X150 (MISCELLANEOUS) ×3 IMPLANT
IV NS IRRIG 3000ML ARTHROMATIC (IV SOLUTION) ×6 IMPLANT
KIT TURNOVER CYSTO (KITS) ×3 IMPLANT
MANIFOLD NEPTUNE II (INSTRUMENTS) ×3 IMPLANT
PACK CYSTO (CUSTOM PROCEDURE TRAY) ×3 IMPLANT
PAD ARMBOARD 7.5X6 YLW CONV (MISCELLANEOUS) ×3 IMPLANT
SHEATH URETERAL 12FRX35CM (MISCELLANEOUS) ×2 IMPLANT
STENT URET 6FRX26 CONTOUR (STENTS) ×2 IMPLANT
SYR 10ML LL (SYRINGE) ×3 IMPLANT
SYR CONTROL 10ML LL (SYRINGE) ×3 IMPLANT
TOWEL OR 17X26 4PK STRL BLUE (TOWEL DISPOSABLE) ×3 IMPLANT
WATER STERILE IRR 500ML POUR (IV SOLUTION) ×3 IMPLANT

## 2021-06-22 NOTE — Anesthesia Procedure Notes (Signed)
Procedure Name: Intubation ?Date/Time: 06/22/2021 9:31 AM ?Performed by: Jonna Munro, CRNA ?Pre-anesthesia Checklist: Patient identified, Emergency Drugs available, Suction available, Patient being monitored and Timeout performed ?Patient Re-evaluated:Patient Re-evaluated prior to induction ?Oxygen Delivery Method: Circle system utilized ?Preoxygenation: Pre-oxygenation with 100% oxygen ?Induction Type: IV induction ?Ventilation: Mask ventilation without difficulty ?Laryngoscope Size: Mac and 3 ?Grade View: Grade I ?Tube type: Oral ?Tube size: 7.0 mm ?Number of attempts: 1 ?Airway Equipment and Method: Stylet ?Secured at: 22 cm ?Tube secured with: Tape ?Dental Injury: Teeth and Oropharynx as per pre-operative assessment  ? ? ? ? ?

## 2021-06-22 NOTE — Progress Notes (Signed)
? ?[facility] ?PROGRESS NOTE ? ? ?Jamie Conley TMA:[id number] DOB: 16-Jun-1975 DOA: 06/16/2021 ? ?PCP: Gildardo Pounds, NP ? ?Brief History/Interval Summary: 46 y.o. female with medical history significant of renal stones, T2DM, depression and hypertension who presented to the ED with refractive left flank pain. Patient had cystoscopy with left retrograde pyelography, left ureteroscopic stone manipulation with basket extraction, and left 6 x 26 JJ stent placement on 06/09/21. She was prescribed opioid analgesics for pain control. At home she continued to have persistent left flank pain, and her analgesic regimen was changed from hydrocodone to oxycodone, but her pain continued to be uncontrolled. She then was placed on hydromorphone 4 mg po that unfortunately was not effective in controlling her pain. Her urologist Dr. Alyson Ingles recommended her to come to the ED for further management.  Patient was found to have abnormal UA.  Her symptoms were thought to be suggestive of pyelonephritis.  She was hospitalized for further management. ? ?Consultants: Urology ? ?Procedures: Urological procedure is planned for today ? ? ? ?Subjective/Interval History: ?Pain is 4 out of 10 in intensity in the left flank.  Abdominal wound is getting better.  No other complaints offered.   ? ?Assessment/Plan: ? ?Acute pyelonephritis/sepsis present on admission ?Patient was noted to have tachycardia and leukocytosis at the time of admission. ?CT renal stone study showed left nephroureteral stent in place with decompression of the renal system.  No stone was noted.  Nonobstructing left nephrolithiasis was noted.  Left perinephric edema was appreciated.  No abscess was noted. ?Patient was started on ceftriaxone.   ?Urine culture positive for 20,000 colonies of E. coli.  Noted to be pansensitive. ?She was left on ceftriaxone as there was plan for procedure today.  WBC remains persistently elevated though procalcitonin is  normal.  Review of old labs show that her WBC has always been high previously as well.  She is afebrile. ?Do not anticipate that she will need antibiotics at discharge.  Will defer to neurology. ?Patient to undergo urological procedure today. ?Pain seems to be reasonably well controlled though patient is on significant doses of narcotics.  No changes to be made today. ?[facility] prescriber database was reviewed.  Looks like she has had multiple prescriptions for narcotics and Carisoprodol filled in the last 5 months.  Most recently she was prescribed 30 tablets of hydromorphone 4 mg each on May 10.  Prior to that she was prescribed 30 tablets of Percocet 10-3 25 on May 3.  30 tablets of Percocet again prescribed on April 28 and April 19.  Hydrocodone/acetaminophen prescribed on April 18, February 8.  Hydromorphone prescribed in December and November. ? ?Left renal stone ?Underwent cystoscopy, pyelogram, stone extraction and stent placement on 5/3 by Dr. Alyson Ingles.  Stable findings are noted on CT scan. ?Patient mentioned hematuria on 5/12.  UA was repeated which does not show any change from previous sample.  Pinkish urine was documented by nursing staff.  Mild degree of hematuria is not surprising in a patient with a ureteral stent. ?Seen by Dr. Alyson Ingles with urology.  Urological procedure today.   ? ?Acute kidney injury ?Likely prerenal azotemia.  Creatinine was 1.31 at presentation.  Now back to normal.  Continue to monitor urine output.   ? ?Diabetes mellitus type 2 with hyperlipidemia ?Uses insulin at home.  Currently on Levemir and SSI.  Monitor CBGs.  HbA1c 11.4 as of November 2022.  Noted to be 7.1 during this admission.   ? ?Abdominal wound skin  wound ?Ulcerated skin lesion noted on abdominal wall.  Yellowish exudate was noted.  No abscess reported on CT scan.  Antibiotic ointment 3 times a day.   ?Some oozing of blood was noted from the wound 2 days ago.  Pressure dressing was applied and bleeding is  subsided.  Wound appears to be improving as of examination this morning ? ?Normocytic anemia ?Drop in hemoglobin noted.  Possibly dilutional.  Stable for the most part.   ? ?History of fibromyalgia ?Continue home medications.  Noted to be on buspirone, Lexapro, gabapentin and trazodone. ? ?Class III obesity ?Estimated body mass index is 43.97 kg/m? as calculated from the following: ?  Height as of this encounter: '5\' 3"'$  (1.6 m). ?  Weight as of this encounter: 112.6 kg. ? ? ?DVT Prophylaxis: Lovenox ?Code Status: Full code ?Family Communication: Discussed with patient ?Disposition Plan: Hopefully return home when improved.  Has been ambulating in the room. ? ?Status is: Inpatient ?Remains inpatient appropriate because: Acute pyelonephritis requiring IV antibiotics ? ? ? ? ? ?Medications: Scheduled: ? [MAR Hold] atorvastatin  40 mg Oral Daily  ? [MAR Hold] busPIRone  15 mg Oral BID  ? [MAR Hold] enoxaparin (LOVENOX) injection  40 mg Subcutaneous QHS  ? [MAR Hold] escitalopram  20 mg Oral Daily  ? [MAR Hold] gabapentin  300 mg Oral QPM  ? [MAR Hold] gabapentin  600 mg Oral q morning  ? [MAR Hold] insulin aspart  0-9 Units Subcutaneous TID WC  ? [MAR Hold] insulin detemir  10 Units Subcutaneous Q2200  ? [MAR Hold] neomycin-bacitracin-polymyxin   Topical TID  ? [MAR Hold] oxyCODONE  20 mg Oral Q12H  ? [MAR Hold] pantoprazole  40 mg Oral Daily  ? [MAR Hold] polyethylene glycol  17 g Oral Daily  ? [MAR Hold] potassium chloride  40 mEq Oral Once  ? [MAR Hold] tamsulosin  0.4 mg Oral QPC supper  ? [MAR Hold] traZODone  150 mg Oral QHS  ? ?Continuous: ? sodium chloride 50 mL/hr at 06/22/21 0741  ? [MAR Hold] cefTRIAXone (ROCEPHIN)  IV 1 g (06/21/21 1945)  ? lactated ringers 50 mL/hr at 06/22/21 4854  ? ?PRN:[MAR Hold] acetaminophen **OR** [MAR Hold] acetaminophen, [MAR Hold] ALPRAZolam, diatrizoate meglumine, [MAR Hold] diazepam, [MAR Hold] diphenhydrAMINE, [MAR Hold]  HYDROmorphone (DILAUDID) injection, [MAR Hold]  morphine  injection, [MAR Hold] ondansetron **OR** [MAR Hold] ondansetron (ZOFRAN) IV, [MAR Hold] oxyCODONE, sodium chloride irrigation, water for irrigation, sterile ? ?Antibiotics: ?Anti-infectives (From admission, onward)  ? ? Start     Dose/Rate Route Frequency Ordered Stop  ? 06/22/21 0846  ceFAZolin (ANCEF) 2-4 GM/100ML-% IVPB       ?Note to Pharmacy: Frederik Pear: cabinet override  ?    06/22/21 0846 06/22/21 0945  ? 06/22/21 0600  ceFAZolin (ANCEF) IVPB 2g/100 mL premix       ? 2 g ?200 mL/hr over 30 Minutes Intravenous 30 min pre-op 06/22/21 0338 06/22/21 0953  ? 06/17/21 2000  [MAR Hold]  cefTRIAXone (ROCEPHIN) 1 g in sodium chloride 0.9 % 100 mL IVPB        (MAR Hold since Tue 06/22/2021 at 0828.Hold Reason: Transfer to a Procedural area)  ? 1 g ?200 mL/hr over 30 Minutes Intravenous Every 24 hours 06/16/21 2249    ? 06/16/21 2045  cefTRIAXone (ROCEPHIN) 1 g in sodium chloride 0.9 % 100 mL IVPB       ? 1 g ?200 mL/hr over 30 Minutes Intravenous  Once 06/16/21 2040 06/16/21  2128  ? ?  ? ? ?Objective: ? ?Vital Signs ? ?Vitals:  ? 06/21/21 1424 06/21/21 2120 06/22/21 5520 06/22/21 0834  ?BP: 122/77 136/84 127/78 115/68  ?Pulse: 85 98 86 62  ?Resp: '20 16 16 14  '$ ?Temp: 98.9 ?F (37.2 ?C) 98.8 ?F (37.1 ?C) 98.3 ?F (36.8 ?C) 98.5 ?F (36.9 ?C)  ?TempSrc: Oral Oral Oral Oral  ?SpO2: 99% 98% 96% 94%  ?Weight:    112.6 kg  ?Height:    '5\' 3"'$  (1.6 m)  ? ? ?Intake/Output Summary (Last 24 hours) at 06/22/2021 1032 ?Last data filed at 06/22/2021 1012 ?Gross per 24 hour  ?Intake 600 ml  ?Output 1 ml  ?Net 599 ml  ? ? ?Filed Weights  ? 06/16/21 2255 06/19/21 0545 06/22/21 0834  ?Weight: 108.5 kg 112.6 kg 112.6 kg  ? ? ?General appearance: Awake alert.  In no distress ?Resp: Clear to auscultation bilaterally.  Normal effort ?Cardio: S1-S2 is normal regular.  No S3-S4.  No rubs murmurs or bruit ?GI: Abdomen is soft.  Tender in the left flank area ?Wound on the abdomen appears to be improving.  Yellowish exudate improved.  No bleeding  noted this morning. ?Extremities: No edema.  Full range of motion of lower extremities. ?Neurologic: Alert and oriented x3.  No focal neurological deficits.  ? ? ? ? ?Lab Results: ? ?Data Reviewed: I have per

## 2021-06-22 NOTE — Anesthesia Postprocedure Evaluation (Signed)
Anesthesia Post Note ? ?Patient: Jamie Conley ? ?Procedure(s) Performed: CYSTOSCOPY WITH RETROGRADE PYELOGRAM, URETEROSCOPY AND STENT EXCHANGE (Left: Ureter) ? ?Patient location during evaluation: PACU ?Anesthesia Type: General ?Level of consciousness: awake and alert and oriented ?Pain management: pain level controlled ?Vital Signs Assessment: post-procedure vital signs reviewed and stable ?Respiratory status: spontaneous breathing, nonlabored ventilation and respiratory function stable ?Cardiovascular status: blood pressure returned to baseline and stable ?Postop Assessment: no apparent nausea or vomiting ?Anesthetic complications: no ? ? ?No notable events documented. ? ? ?Last Vitals:  ?Vitals:  ? 06/22/21 1021 06/22/21 1106  ?BP: 117/89 (!) 144/86  ?Pulse: 78 75  ?Resp: 11   ?Temp: 36.7 ?C   ?SpO2: (!) 88% 96%  ?  ?Last Pain:  ?Vitals:  ? 06/22/21 1125  ?TempSrc:   ?PainSc: 5   ? ? ?  ?  ?  ?  ?  ?  ? ?Keilani Terrance C Niko Penson ? ? ? ? ?

## 2021-06-22 NOTE — Transfer of Care (Signed)
Immediate Anesthesia Transfer of Care Note ? ?Patient: [name] ? ?Procedure(s) Performed: CYSTOSCOPY WITH RETROGRADE PYELOGRAM, URETEROSCOPY AND STENT EXCHANGE (Left: Ureter) ? ?Patient Location: PACU ? ?Anesthesia Type:General ? ?Level of Consciousness: awake, alert , oriented and patient cooperative ? ?Airway & Oxygen Therapy: Patient Spontanous Breathing and Patient connected to face mask oxygen ? ?Post-op Assessment: Report given to RN, Post -op Vital signs reviewed and stable and Patient moving all extremities X 4 ? ?Post vital signs: Reviewed and stable ? ?Last Vitals:  ?Vitals Value Taken Time  ?BP    ?Temp    ?Pulse    ?Resp    ?SpO2    ? ? ?Last Pain:  ?Vitals:  ? 06/22/21 0921  ?TempSrc:   ?PainSc: 7   ?   ? ?Patients Stated Pain Goal: 5 (06/22/21 7078) ? ?Complications: No notable events documented. ?

## 2021-06-22 NOTE — Interval H&P Note (Signed)
History and Physical Interval Note: ? ?06/22/2021 ?8:50 AM ? ?[name]  has presented today for surgery, with the diagnosis of left renal calculus.  The various methods of treatment have been discussed with the patient and family. After consideration of risks, benefits and other options for treatment, the patient has consented to  Procedure(s) with comments: ?Ward, URETEROSCOPY AND STENT EXCHANGE (Left) - pt is inpt, dr requests to be moved up ?HOLMIUM LASER APPLICATION (Left) as a surgical intervention.  The patient's history has been reviewed, patient examined, no change in status, stable for surgery.  I have reviewed the patient's chart and labs.  Questions were answered to the patient's satisfaction.   ? ? ?Jamie Conley ? ? ?

## 2021-06-22 NOTE — Op Note (Signed)
.  Preoperative diagnosis: Left ureteral stone ? ?Postoperative diagnosis: Same ? ?Procedure: 1 cystoscopy ?2. Left retrograde pyelography ?3.  Intraoperative fluoroscopy, under one hour, with interpretation ?4.  Left ureteroscopic stone manipulation with basket extraction ?5.  Left 6 x 26 JJ stent placement ? ?Attending: Rosie Fate ? ?Anesthesia: General ? ?Estimated blood loss: None ? ?Drains: Left 6 x 26 JJ ureteral stent with tether ? ?Specimens: stone for analysis ? ?Antibiotics: ancef ? ?Findings: left lower pole calculi. No hydronephrosis. No masses/lesions in the bladder. Ureteral orifices in normal anatomic location. ? ?Indications: Patient is a 46 year old female with a history of left renal stone and who has persistent left flank pain. She underwent left ureteral stent placement 2 weeks ago.  After discussing treatment options, she decided proceed with left ureteroscopic stone manipulation. ? ?Procedure in detail: The patient was brought to the operating room and a brief timeout was done to ensure correct patient, correct procedure, correct site.  General anesthesia was administered patient was placed in dorsal lithotomy position.  Her genitalia was then prepped and draped in usual sterile fashion.  A rigid 60 French cystoscope was passed in the urethra and the bladder.  Bladder was inspected free masses or lesions.  the ureteral orifices were in the normal orthotopic locations.  a 6 french ureteral catheter was then instilled into the left ureteral orifice.  a gentle retrograde was obtained and findings noted above.  Using a graspet the left ureteral stnet was brought to the urethral meatus. we then placed a zip wire through the ureteral stent and advanced up to the renal pelvis.  We then removed the stent. we then removed the cystoscope and cannulated the left ureteral orifice with a semirigid ureteroscope.  No stone was found in the ureter. Once we reached the UPJ a sensor wire was advanced in to  the renal pelvis. We then removed the ureteroscope and advanced am 12/14 x 35cm access sheath up to the renal pelvis. We then used the flexible ureteroscope to perform nephroscopy. We encountered the stones in the lower pole.  Using a NGage basket the stones were removed.     once all stone were removed we then removed the access sheath under direct vision and noted no injury to the ureter. We then placed a 6 x 26 double-j ureteral stent over the original zip wire.  We then removed the wire and good coil was noted in the the renal pelvis under fluoroscopy and the bladder under direct vision. the bladder was then drained and this concluded the procedure which was well tolerated by patient. ? ?Complications: None ? ?Condition: Stable, extubated, transferred to PACU ? ?Plan: Patient is to be discharged home as to follow-up in one week. She can remove her stent in 72 hours by pulling the tether ?

## 2021-06-23 ENCOUNTER — Other Ambulatory Visit: Payer: Self-pay

## 2021-06-23 ENCOUNTER — Encounter (HOSPITAL_COMMUNITY): Payer: Self-pay | Admitting: Urology

## 2021-06-23 DIAGNOSIS — N179 Acute kidney failure, unspecified: Secondary | ICD-10-CM | POA: Diagnosis not present

## 2021-06-23 DIAGNOSIS — N12 Tubulo-interstitial nephritis, not specified as acute or chronic: Secondary | ICD-10-CM | POA: Diagnosis not present

## 2021-06-23 DIAGNOSIS — L98499 Non-pressure chronic ulcer of skin of other sites with unspecified severity: Secondary | ICD-10-CM | POA: Diagnosis not present

## 2021-06-23 LAB — CBC
HCT: 32.3 % — ABNORMAL LOW (ref 36.0–46.0)
Hemoglobin: 10.1 g/dL — ABNORMAL LOW (ref 12.0–15.0)
MCH: 30.2 pg (ref 26.0–34.0)
MCHC: 31.3 g/dL (ref 30.0–36.0)
MCV: 96.7 fL (ref 80.0–100.0)
Platelets: 702 10*3/uL — ABNORMAL HIGH (ref 150–400)
RBC: 3.34 MIL/uL — ABNORMAL LOW (ref 3.87–5.11)
RDW: 17.6 % — ABNORMAL HIGH (ref 11.5–15.5)
WBC: 21.6 10*3/uL — ABNORMAL HIGH (ref 4.0–10.5)
nRBC: 0.5 % — ABNORMAL HIGH (ref 0.0–0.2)

## 2021-06-23 LAB — BASIC METABOLIC PANEL
Anion gap: 7 (ref 5–15)
BUN: 19 mg/dL (ref 6–20)
CO2: 25 mmol/L (ref 22–32)
Calcium: 8.7 mg/dL — ABNORMAL LOW (ref 8.9–10.3)
Chloride: 106 mmol/L (ref 98–111)
Creatinine, Ser: 0.81 mg/dL (ref 0.44–1.00)
GFR, Estimated: >60 mL/min (ref >60)
Glucose, Bld: 207 mg/dL — ABNORMAL HIGH (ref 70–99)
Potassium: 3.6 mmol/L (ref 3.5–5.1)
Sodium: 138 mmol/L (ref 135–145)

## 2021-06-23 LAB — GLUCOSE, CAPILLARY: Glucose-Capillary: 189 mg/dL — ABNORMAL HIGH (ref 70–99)

## 2021-06-23 MED ORDER — OXYCODONE-ACETAMINOPHEN 10-325 MG PO TABS
1.0000 | ORAL_TABLET | Freq: Four times a day (QID) | ORAL | 0 refills | Status: DC | PRN
Start: 2021-06-23 — End: 2021-06-28
  Filled 2021-06-23: qty 15, 4d supply, fill #0

## 2021-06-23 MED ORDER — CEFUROXIME AXETIL 500 MG PO TABS
500.0000 mg | ORAL_TABLET | Freq: Two times a day (BID) | ORAL | 0 refills | Status: DC
  Filled 2021-06-23: qty 6, 3d supply, fill #0

## 2021-06-23 NOTE — Discharge Summary (Addendum)
?Physician Discharge Summary ?  ?Patient: Jamie Conley MRN: [id number] DOB: [birth year]  ?Admit date:     06/16/2021  ?Discharge date: 06/23/21  ?Discharge Physician: Corrie Mckusick Donnel Venuto  ? ?PCP: Gildardo Pounds, NP  ? ?Recommendations at discharge:  ? ?Follow-up with Dr. Alyson Ingles urology in 1 week.  Would recommend CBC BMP  in the next visit. ?Follow-up with primary care provider in 2 to 3 weeks. ? ?Discharge Diagnoses: ?Principal Problem: ?  Pyelonephritis ?Active Problems: ?  AKI (acute kidney injury) ([place]) ?  Type 2 diabetes mellitus with hyperlipidemia ([place]) ?  Fibromyalgia ?  Class 3 obesity (HCC) ?  Abdominal wall skin ulcer ([place]) ? ?Resolved Problems: ?  * No resolved hospital problems. * ? ?Hospital Course: ?46 y.o. female with medical history significant of renal stones, diabetes mellitus type 2, depression, hypertension presented to the hospital with left flank pain.  Patient then underwent cystoscopy with left retrograde pyelography, left ureteroscopic stone manipulation with basket extraction, and left 6 x 26 JJ stent placement on 06/09/21. She was prescribed opioid analgesics for pain control. At home she continued to have persistent left flank pain, and her analgesic regimen was changed from hydrocodone to oxycodone, but her pain continued to be uncontrolled. She then was placed on hydromorphone 4 mg po that unfortunately was not effective in controlling her pain. Her urologist Dr. Alyson Ingles recommended her to come to the ED for further management.  Patient was found to have abnormal UA.  Her symptoms were thought to be suggestive of pyelonephritis.  She was hospitalized for further management. ? ?Assessment and Plan: ? ?Principal Problem: ?  Pyelonephritis ?Active Problems: ?  AKI (acute kidney injury) ([place]) ?  Type 2 diabetes mellitus with hyperlipidemia ([place]) ?  Fibromyalgia ?  Class 3 obesity (HCC) ?  Abdominal wall skin ulcer ([place]) ?  ?Sepsis secondary to acute pyelonephritis ?Patient  presented with tachycardia, leukocytosis consistent with sepsis secondary to pyelonephritis.  Received her normal saline IV Rocephin.  Pain control was continued with the hydromorphone and oxycodone.  Received supportive care.  Tamsulosin was resumed.  She did have leukocytosis at 21.6 K.  However afebrile.  We will continue 3 more days of antibiotic on discharge to complete the course.  Patient did have E. coli in urine culture from 06/16/2021 with insignificant colonies.  Will need to follow-up with PCP as outpatient. ? ?AKI (acute kidney injury) ([place]) ?Improved with IV fluids.  Creatinine prior to discharge was 0.8 ? ?Type 2 diabetes mellitus with hyperlipidemia ([place]) ?Resume home medication regimen including Trulicity and insulin regimen.  Patient was advised to follow-up with her primary care physician as outpatient. ? ?Fibromyalgia ?Continue buspirone, escitalopram, gabapentin and trazodone.  ? ?Abdominal wall skin ulcer ([place]) ?Burn type ulcerated skin lesion at the abdominal wall, present on admission ?Was on neomycin ointment during hospitalization. ? ?Class 3 obesity ([place]) ?BMI at 42.2.  Would benefit from weight loss as outpatient ? ?Consultants: Urology ? ?Procedures performed: Cystoscopy with left ureteric stone manipulation and basket extraction with placement of double-J stent on 06/22/2021 by Dr. Alyson Ingles, urology ? ?Disposition: Home ? ?Diet recommendation:  ?Discharge Diet Orders (From admission, onward)  ? ?  Start     Ordered  ? 06/23/21 0000  Diet general       ? 06/23/21 0934  ? ?  ?  ? ?  ? ?Regular diet ?DISCHARGE MEDICATION: ?Allergies as of 06/23/2021   ?No Known Allergies ?  ? ?  ?Medication List  ?  ? ?  STOP taking these medications   ? ?carisoprodol 350 MG tablet ?Commonly known as: SOMA ?  ?etodolac 300 MG capsule ?Commonly known as: LODINE ?  ?mirabegron ER 25 MG Tb24 tablet ?Commonly known as: MYRBETRIQ ?  ?tiZANidine 4 MG tablet ?Commonly known as: Zanaflex ?  ? ?  ? ?TAKE these  medications   ? ?atorvastatin 40 MG tablet ?Commonly known as: LIPITOR ?Take 1 tablet (40 mg total) by mouth daily. ?  ?blood glucose meter kit and supplies Kit ?Dispense based on patient and insurance preference. Use up to four times daily as directed. ?  ?busPIRone 15 MG tablet ?Commonly known as: BUSPAR ?Take 1 tablet (15 mg total) by mouth 2 (two) times daily. ?  ?cefUROXime 500 MG tablet ?Commonly known as: CEFTIN ?Take 1 tablet (500 mg total) by mouth 2 (two) times daily with a meal for 3 days. ?  ?diphenhydrAMINE 25 MG tablet ?Commonly known as: BENADRYL ?Take 25 mg by mouth every 6 (six) hours as needed for allergies. ?  ?escitalopram 20 MG tablet ?Commonly known as: LEXAPRO ?Take 1 tablet (20 mg total) by mouth daily. ?  ?gabapentin 300 MG capsule ?Commonly known as: NEURONTIN ?Take 1 capsule (300 mg total) by mouth 3 (three) times daily. ?What changed:  ?when to take this ?additional instructions ?  ?HYDROmorphone 4 MG tablet ?Commonly known as: Dilaudid ?Take 1 tablet (4 mg total) by mouth every 4 (four) hours as needed for severe pain. ?  ?insulin aspart protamine- aspart (70-30) 100 UNIT/ML injection ?Commonly known as: NOVOLOG MIX 70/30 ?Inject 20 Units into the skin 2 (two) times daily with a meal. ?  ?Insulin Pen Needle 31G X 5 MM Misc ?100 each by Does not apply route 2 (two) times daily. ?  ?Levemir FlexPen 100 UNIT/ML FlexPen ?Generic drug: insulin detemir ?inject 20 units into the skin at bedtime ?  ?lisinopril 20 MG tablet ?Commonly known as: ZESTRIL ?Take 1 tablet (20 mg total) by mouth daily. ?  ?metFORMIN 1000 MG tablet ?Commonly known as: GLUCOPHAGE ?Take 1 tablet (1,000 mg total) by mouth 2 (two) times daily with a meal. ?  ?NovoLOG FlexPen 100 UNIT/ML FlexPen ?Generic drug: insulin aspart ?For blood sugars 0-150 give 0 units of insulin, 151-200 give 2 units of insulin, 201-250 give 4 units, 251-300 give 6 units, 301-350 give 8 units, 351-400 give 10 units,> 400 give 12 units and call  M.D. ?Discussed hypoglycemia protocol. ?  ?ondansetron 4 MG tablet ?Commonly known as: Zofran ?Take 1 tablet (4 mg total) by mouth daily as needed for nausea or vomiting. ?  ?oxyCODONE-acetaminophen 10-325 MG tablet ?Commonly known as: Percocet ?Take 1 tablet by mouth every 6 (six) hours as needed for pain. ?What changed: when to take this ?  ?tamsulosin 0.4 MG Caps capsule ?Commonly known as: Flomax ?Take 1 capsule (0.4 mg total) by mouth daily after supper. ?  ?traZODone 150 MG tablet ?Commonly known as: DESYREL ?Take 1 tablet (150 mg total) by mouth at bedtime as needed for sleep. ?What changed: when to take this ?  ?Trulicity 6.80 SU/1.1SR Sopn ?Generic drug: Dulaglutide ?Inject 0.75 mg into the skin once a week. ?  ? ?  ? ? Follow-up Information   ? ? Gildardo Pounds, NP Follow up in 1 week(s).   ?Specialty: Nurse Practitioner ?Contact information: ?[address] ?[place] [address] ?[phone] ? ? ?  ?  ? ? McKenzie, Candee Furbish, MD Follow up in 1 week(s).   ?Specialty: Urology ?Why:  for stone followup ?Contact information: ?[address]  ?[address] ?[place] [address] ?[phone] ? ? ?  ?  ? ?  ?  ? ?  ? ?Subjective.  Patient denies any nausea vomiting fever but has mild abdominal pain. ? ?Discharge Exam: ?Filed Weights  ? 06/16/21 2255 06/19/21 0545 06/22/21 0834  ?Weight: 108.5 kg 112.6 kg 112.6 kg  ? ? ?  06/23/2021  ?  5:02 AM 06/22/2021  ?  9:19 PM 06/22/2021  ?  2:26 PM  ?Vitals with BMI  ?Systolic 175 102 585  ?Diastolic 81 79 84  ?Pulse 84 93 91  ? Body mass index is 43.97 kg/m?.  ? ?General: Morbidly obese built, not in obvious distress ?HENT:   No scleral pallor or icterus noted. Oral mucosa is moist.  ?Chest:  Clear breath sounds.  Diminished breath sounds bilaterally. No crackles or wheezes.  ?CVS: S1 &S2 heard. No murmur.  Regular rate and rhythm. ?Abdomen: Soft, nonspecific tenderness over the left upper quadrant nondistended.  Bowel sounds are heard.   ?Extremities: No cyanosis,  clubbing or edema.  Peripheral pulses are palpable. ?Psych: Alert, awake and oriented, normal mood ?CNS:  No cranial nerve deficits.  Power equal in all extremities.   ?Skin: Warm and dry.  No rashes noted. ? ? ?Condition

## 2021-06-23 NOTE — Progress Notes (Signed)
Discharge instructions reviewed and scripts sent to pharmacy. To follow up with primary care and urology.  Walked to front entrance to wait for ride.  ?

## 2021-06-24 ENCOUNTER — Other Ambulatory Visit: Payer: Self-pay

## 2021-06-24 ENCOUNTER — Inpatient Hospital Stay (HOSPITAL_COMMUNITY)
Admission: EM | Admit: 2021-06-24 | Discharge: 2021-06-28 | DRG: 392 | Disposition: A | Discharge status: Home / Self Care | Payer: 59 | Attending: Internal Medicine | Admitting: Internal Medicine

## 2021-06-24 ENCOUNTER — Telehealth: Payer: Self-pay

## 2021-06-24 ENCOUNTER — Emergency Department (HOSPITAL_COMMUNITY): Payer: 59

## 2021-06-24 ENCOUNTER — Encounter (HOSPITAL_COMMUNITY): Payer: Self-pay | Admitting: Oncology

## 2021-06-24 DIAGNOSIS — F32A Depression, unspecified: Secondary | ICD-10-CM | POA: Diagnosis not present

## 2021-06-24 DIAGNOSIS — R1110 Vomiting, unspecified: Secondary | ICD-10-CM

## 2021-06-24 DIAGNOSIS — Z87442 Personal history of urinary calculi: Secondary | ICD-10-CM

## 2021-06-24 DIAGNOSIS — D75839 Thrombocytosis, unspecified: Secondary | ICD-10-CM | POA: Diagnosis present

## 2021-06-24 DIAGNOSIS — Z9081 Acquired absence of spleen: Secondary | ICD-10-CM

## 2021-06-24 DIAGNOSIS — R911 Solitary pulmonary nodule: Secondary | ICD-10-CM | POA: Diagnosis present

## 2021-06-24 DIAGNOSIS — Z6841 Body Mass Index (BMI) 40.0 and over, adult: Secondary | ICD-10-CM

## 2021-06-24 DIAGNOSIS — Z794 Long term (current) use of insulin: Secondary | ICD-10-CM

## 2021-06-24 DIAGNOSIS — R109 Unspecified abdominal pain: Secondary | ICD-10-CM | POA: Diagnosis not present

## 2021-06-24 DIAGNOSIS — R1031 Right lower quadrant pain: Secondary | ICD-10-CM | POA: Diagnosis present

## 2021-06-24 DIAGNOSIS — Z83438 Family history of other disorder of lipoprotein metabolism and other lipidemia: Secondary | ICD-10-CM

## 2021-06-24 DIAGNOSIS — I10 Essential (primary) hypertension: Secondary | ICD-10-CM | POA: Diagnosis present

## 2021-06-24 DIAGNOSIS — Z90710 Acquired absence of both cervix and uterus: Secondary | ICD-10-CM

## 2021-06-24 DIAGNOSIS — R112 Nausea with vomiting, unspecified: Secondary | ICD-10-CM | POA: Diagnosis not present

## 2021-06-24 DIAGNOSIS — E785 Hyperlipidemia, unspecified: Secondary | ICD-10-CM

## 2021-06-24 DIAGNOSIS — M797 Fibromyalgia: Secondary | ICD-10-CM | POA: Diagnosis present

## 2021-06-24 DIAGNOSIS — D72829 Elevated white blood cell count, unspecified: Secondary | ICD-10-CM

## 2021-06-24 DIAGNOSIS — Z79899 Other long term (current) drug therapy: Secondary | ICD-10-CM

## 2021-06-24 DIAGNOSIS — R197 Diarrhea, unspecified: Secondary | ICD-10-CM | POA: Diagnosis present

## 2021-06-24 DIAGNOSIS — Z7985 Long-term (current) use of injectable non-insulin antidiabetic drugs: Secondary | ICD-10-CM

## 2021-06-24 DIAGNOSIS — E1169 Type 2 diabetes mellitus with other specified complication: Secondary | ICD-10-CM | POA: Diagnosis not present

## 2021-06-24 DIAGNOSIS — E7800 Pure hypercholesterolemia, unspecified: Secondary | ICD-10-CM | POA: Diagnosis present

## 2021-06-24 DIAGNOSIS — E876 Hypokalemia: Secondary | ICD-10-CM | POA: Diagnosis present

## 2021-06-24 DIAGNOSIS — F419 Anxiety disorder, unspecified: Secondary | ICD-10-CM | POA: Diagnosis present

## 2021-06-24 DIAGNOSIS — Z8249 Family history of ischemic heart disease and other diseases of the circulatory system: Secondary | ICD-10-CM

## 2021-06-24 DIAGNOSIS — Z79890 Hormone replacement therapy (postmenopausal): Secondary | ICD-10-CM

## 2021-06-24 LAB — URINALYSIS, ROUTINE W REFLEX MICROSCOPIC
Bilirubin Urine: NEGATIVE
Glucose, UA: 150 mg/dL — AB
Ketones, ur: 20 mg/dL — AB
Leukocytes,Ua: NEGATIVE
Nitrite: NEGATIVE
Protein, ur: NEGATIVE mg/dL
Specific Gravity, Urine: 1.008 (ref 1.005–1.030)
pH: 9 — ABNORMAL HIGH (ref 5.0–8.0)

## 2021-06-24 LAB — COMPREHENSIVE METABOLIC PANEL
ALT: 27 U/L (ref 0–44)
AST: 23 U/L (ref 15–41)
Albumin: 3.6 g/dL (ref 3.5–5.0)
Alkaline Phosphatase: 74 U/L (ref 38–126)
Anion gap: 12 (ref 5–15)
BUN: 11 mg/dL (ref 6–20)
CO2: 22 mmol/L (ref 22–32)
Calcium: 9 mg/dL (ref 8.9–10.3)
Chloride: 105 mmol/L (ref 98–111)
Creatinine, Ser: 0.81 mg/dL (ref 0.44–1.00)
GFR, Estimated: >60 mL/min (ref >60)
Glucose, Bld: 272 mg/dL — ABNORMAL HIGH (ref 70–99)
Potassium: 3.2 mmol/L — ABNORMAL LOW (ref 3.5–5.1)
Sodium: 139 mmol/L (ref 135–145)
Total Bilirubin: 0.8 mg/dL (ref 0.3–1.2)
Total Protein: 7.4 g/dL (ref 6.5–8.1)

## 2021-06-24 LAB — CBC
HCT: 33.7 % — ABNORMAL LOW (ref 36.0–46.0)
Hemoglobin: 11.3 g/dL — ABNORMAL LOW (ref 12.0–15.0)
MCH: 32 pg (ref 26.0–34.0)
MCHC: 33.5 g/dL (ref 30.0–36.0)
MCV: 95.5 fL (ref 80.0–100.0)
Platelets: 572 10*3/uL — ABNORMAL HIGH (ref 150–400)
RBC: 3.53 MIL/uL — ABNORMAL LOW (ref 3.87–5.11)
RDW: 17.6 % — ABNORMAL HIGH (ref 11.5–15.5)
WBC: 22.2 10*3/uL — ABNORMAL HIGH (ref 4.0–10.5)
nRBC: 0.5 % — ABNORMAL HIGH (ref 0.0–0.2)

## 2021-06-24 LAB — MAGNESIUM: Magnesium: 1.3 mg/dL — ABNORMAL LOW (ref 1.7–2.4)

## 2021-06-24 LAB — I-STAT BETA HCG BLOOD, ED (MC, WL, AP ONLY): I-stat hCG, quantitative: <5 m[IU]/mL (ref <5)

## 2021-06-24 LAB — LIPASE, BLOOD: Lipase: 30 U/L (ref 11–51)

## 2021-06-24 LAB — GLUCOSE, CAPILLARY: Glucose-Capillary: 170 mg/dL — ABNORMAL HIGH (ref 70–99)

## 2021-06-24 MED ORDER — INSULIN DETEMIR 100 UNIT/ML ~~LOC~~ SOLN
10.0000 [IU] | Freq: Every day | SUBCUTANEOUS | Status: DC
  Administered 2021-06-24 – 2021-06-27 (×3): 10 [IU] via SUBCUTANEOUS
  Filled 2021-06-24 (×5): qty 0.1

## 2021-06-24 MED ORDER — BUSPIRONE HCL 5 MG PO TABS
15.0000 mg | ORAL_TABLET | Freq: Two times a day (BID) | ORAL | Status: DC
  Administered 2021-06-24 – 2021-06-28 (×8): 15 mg via ORAL
  Filled 2021-06-24 (×8): qty 1

## 2021-06-24 MED ORDER — POTASSIUM CHLORIDE CRYS ER 20 MEQ PO TBCR
40.0000 meq | EXTENDED_RELEASE_TABLET | Freq: Once | ORAL | Status: AC
  Administered 2021-06-24: 40 meq via ORAL
  Filled 2021-06-24: qty 2

## 2021-06-24 MED ORDER — HYDROMORPHONE HCL 1 MG/ML IJ SOLN
1.0000 mg | Freq: Once | INTRAMUSCULAR | Status: AC
  Administered 2021-06-24: 1 mg via INTRAVENOUS
  Filled 2021-06-24: qty 1

## 2021-06-24 MED ORDER — MORPHINE SULFATE (PF) 2 MG/ML IV SOLN
2.0000 mg | INTRAVENOUS | Status: DC | PRN
  Administered 2021-06-25 – 2021-06-26 (×5): 2 mg via INTRAVENOUS
  Filled 2021-06-24 (×5): qty 1

## 2021-06-24 MED ORDER — FENTANYL CITRATE PF 50 MCG/ML IJ SOSY
25.0000 ug | PREFILLED_SYRINGE | Freq: Once | INTRAMUSCULAR | Status: AC
  Administered 2021-06-24: 25 ug via INTRAVENOUS
  Filled 2021-06-24 (×2): qty 1

## 2021-06-24 MED ORDER — KETOROLAC TROMETHAMINE 30 MG/ML IJ SOLN
15.0000 mg | Freq: Once | INTRAMUSCULAR | Status: AC
  Administered 2021-06-24: 15 mg via INTRAVENOUS
  Filled 2021-06-24: qty 1

## 2021-06-24 MED ORDER — MAGNESIUM SULFATE 2 GM/50ML IV SOLN
2.0000 g | Freq: Once | INTRAVENOUS | Status: AC
  Administered 2021-06-24: 2 g via INTRAVENOUS
  Filled 2021-06-24 (×2): qty 50

## 2021-06-24 MED ORDER — HYDROMORPHONE HCL 1 MG/ML IJ SOLN
1.0000 mg | INTRAMUSCULAR | Status: DC | PRN
  Administered 2021-06-25 – 2021-06-26 (×12): 1 mg via INTRAVENOUS
  Filled 2021-06-24 (×12): qty 1

## 2021-06-24 MED ORDER — SODIUM CHLORIDE 0.9 % IV BOLUS
1000.0000 mL | Freq: Once | INTRAVENOUS | Status: AC
  Administered 2021-06-24: 1000 mL via INTRAVENOUS

## 2021-06-24 MED ORDER — ONDANSETRON HCL 4 MG/2ML IJ SOLN
4.0000 mg | Freq: Once | INTRAMUSCULAR | Status: AC
  Administered 2021-06-24: 4 mg via INTRAVENOUS
  Filled 2021-06-24: qty 2

## 2021-06-24 MED ORDER — SODIUM CHLORIDE 0.9 % IV SOLN: INTRAVENOUS | Status: DC

## 2021-06-24 MED ORDER — ENOXAPARIN SODIUM 40 MG/0.4ML IJ SOSY
40.0000 mg | PREFILLED_SYRINGE | INTRAMUSCULAR | Status: DC
  Administered 2021-06-24 – 2021-06-27 (×4): 40 mg via SUBCUTANEOUS
  Filled 2021-06-24 (×5): qty 0.4

## 2021-06-24 MED ORDER — IOHEXOL 300 MG/ML  SOLN
100.0000 mL | Freq: Once | INTRAMUSCULAR | Status: AC | PRN
  Administered 2021-06-24: 100 mL via INTRAVENOUS

## 2021-06-24 MED ORDER — INSULIN ASPART 100 UNIT/ML IJ SOLN
0.0000 [IU] | Freq: Three times a day (TID) | INTRAMUSCULAR | Status: DC
  Administered 2021-06-25 – 2021-06-28 (×10): 3 [IU] via SUBCUTANEOUS

## 2021-06-24 MED ORDER — TRAZODONE HCL 50 MG PO TABS
150.0000 mg | ORAL_TABLET | Freq: Every day | ORAL | Status: DC
  Administered 2021-06-24 – 2021-06-27 (×4): 150 mg via ORAL
  Filled 2021-06-24 (×4): qty 1

## 2021-06-24 MED ORDER — SODIUM CHLORIDE 0.9 % IV BOLUS
500.0000 mL | Freq: Once | INTRAVENOUS | Status: AC
  Administered 2021-06-24: 500 mL via INTRAVENOUS

## 2021-06-24 MED ORDER — FENTANYL CITRATE PF 50 MCG/ML IJ SOSY
50.0000 ug | PREFILLED_SYRINGE | Freq: Once | INTRAMUSCULAR | Status: AC
  Administered 2021-06-24: 50 ug via INTRAVENOUS
  Filled 2021-06-24: qty 1

## 2021-06-24 MED ORDER — SODIUM CHLORIDE (PF) 0.9 % IJ SOLN
INTRAMUSCULAR | Status: AC
  Administered 2021-06-24: 10 mL
  Filled 2021-06-24: qty 50

## 2021-06-24 NOTE — ED Provider Notes (Signed)
[facility] DEPT Provider Note   CSN: [id number] Arrival date & time: 06/24/21  1244     History  Chief Complaint  Patient presents with   Abdominal Pain    [name] is a 46 y.o. female.  HPI She presents for evaluation of persistent left flank pain, since ureteral stent placed 3 days ago.  She complains of persistent urinary frequency but no hematuria or dysuria since then.  She denies fever but has felt warm.  She has had multiple episodes of both vomiting and diarrhea since then.    Home Medications Prior to Admission medications   Medication Sig Start Date End Date Taking? Authorizing Provider  atorvastatin (LIPITOR) 40 MG tablet Take 1 tablet (40 mg total) by mouth daily. 01/01/21   Amin, Jeanella Flattery, MD  blood glucose meter kit and supplies KIT Dispense based on patient and insurance preference. Use up to four times daily as directed. 01/01/21   Amin, Jeanella Flattery, MD  busPIRone (BUSPAR) 15 MG tablet Take 1 tablet (15 mg total) by mouth 2 (two) times daily. 02/19/21   Gildardo Pounds, NP  cefUROXime (CEFTIN) 500 MG tablet Take 1 tablet (500 mg total) by mouth 2 (two) times daily with a meal for 3 days. 06/23/21 06/26/21  Pokhrel, Corrie Mckusick, MD  diphenhydrAMINE (BENADRYL) 25 MG tablet Take 25 mg by mouth every 6 (six) hours as needed for allergies.    [provider]  Dulaglutide (TRULICITY) 2.29 NL/8.9QJ SOPN Inject 0.75 mg into the skin once a week. 06/16/21   Charlott Rakes, MD  escitalopram (LEXAPRO) 20 MG tablet Take 1 tablet (20 mg total) by mouth daily. 02/19/21 07/03/21  Gildardo Pounds, NP  gabapentin (NEURONTIN) 300 MG capsule Take 1 capsule (300 mg total) by mouth 3 (three) times daily. Patient taking differently: Take 300 mg by mouth See admin instructions. Take 600 mg in the morning 300 mg in the evening 02/19/21 06/16/21  Gildardo Pounds, NP  HYDROmorphone (DILAUDID) 4 MG tablet Take 1 tablet (4 mg total) by mouth  every 4 (four) hours as needed for severe pain. 06/15/21   McKenzie, Candee Furbish, MD  insulin aspart (NOVOLOG FLEXPEN) 100 UNIT/ML FlexPen For blood sugars 0-150 give 0 units of insulin, 151-200 give 2 units of insulin, 201-250 give 4 units, 251-300 give 6 units, 301-350 give 8 units, 351-400 give 10 units,> 400 give 12 units and call M.D. Discussed hypoglycemia protocol. 02/19/21   Gildardo Pounds, NP  insulin aspart protamine- aspart (NOVOLOG MIX 70/30) (70-30) 100 UNIT/ML injection Inject 20 Units into the skin 2 (two) times daily with a meal.    [provider]  insulin detemir (LEVEMIR) 100 UNIT/ML FlexPen inject 20 units into the skin at bedtime 02/19/21     Insulin Pen Needle 31G X 5 MM MISC 100 each by Does not apply route 2 (two) times daily. 02/19/21   Gildardo Pounds, NP  lisinopril (ZESTRIL) 20 MG tablet Take 1 tablet (20 mg total) by mouth daily. 02/19/21   Gildardo Pounds, NP  metFORMIN (GLUCOPHAGE) 1000 MG tablet Take 1 tablet (1,000 mg total) by mouth 2 (two) times daily with a meal. 02/19/21   Gildardo Pounds, NP  ondansetron (ZOFRAN) 4 MG tablet Take 1 tablet (4 mg total) by mouth daily as needed for nausea or vomiting. 06/09/21 06/09/22  Cleon Gustin, MD  oxyCODONE-acetaminophen (PERCOCET) 10-325 MG tablet Take 1 tablet by mouth every 6 (six) hours as needed for  pain. 06/23/21   Pokhrel, Corrie Mckusick, MD  tamsulosin (FLOMAX) 0.4 MG CAPS capsule Take 1 capsule (0.4 mg total) by mouth daily after supper. 06/09/21   McKenzie, Candee Furbish, MD  traZODone (DESYREL) 150 MG tablet Take 1 tablet (150 mg total) by mouth at bedtime as needed for sleep. Patient taking differently: Take 150 mg by mouth at bedtime. 02/19/21 07/03/21  Gildardo Pounds, NP      Allergies    Patient has no known allergies.    Review of Systems   Review of Systems  Physical Exam Updated Vital Signs BP (!) 156/81   Pulse 68   Temp 98 F (36.7 C) (Oral)   Resp 18   SpO2 94%  Physical Exam Vitals and nursing  note reviewed.  Constitutional:      General: She is not in acute distress.    Appearance: She is well-developed. She is obese. She is not ill-appearing or toxic-appearing.  HENT:     Head: Normocephalic and atraumatic.     Right Ear: External ear normal.     Left Ear: External ear normal.  Eyes:     Conjunctiva/sclera: Conjunctivae normal.     Pupils: Pupils are equal, round, and reactive to light.  Neck:     Trachea: Phonation normal.  Cardiovascular:     Rate and Rhythm: Normal rate and regular rhythm.     Heart sounds: Normal heart sounds.  Pulmonary:     Effort: Pulmonary effort is normal. No respiratory distress.     Breath sounds: Normal breath sounds. No stridor.  Abdominal:     Palpations: Abdomen is soft.     Tenderness: There is abdominal tenderness (Diffuse, moderate).  Musculoskeletal:        General: Normal range of motion.     Cervical back: Normal range of motion and neck supple.  Skin:    General: Skin is warm and dry.  Neurological:     Mental Status: She is alert and oriented to person, place, and time.     Cranial Nerves: No cranial nerve deficit.     Sensory: No sensory deficit.     Motor: No abnormal muscle tone.     Coordination: Coordination normal.  Psychiatric:        Mood and Affect: Mood normal.        Behavior: Behavior normal.        Thought Content: Thought content normal.        Judgment: Judgment normal.    ED Results / Procedures / Treatments   Labs (all labs ordered are listed, but only abnormal results are displayed) Labs Reviewed  COMPREHENSIVE METABOLIC PANEL - Abnormal; Notable for the following components:      Result Value   Potassium 3.2 (*)    Glucose, Bld 272 (*)    All other components within normal limits  CBC - Abnormal; Notable for the following components:   WBC 22.2 (*)    RBC 3.53 (*)    Hemoglobin 11.3 (*)    HCT 33.7 (*)    RDW 17.6 (*)    Platelets 572 (*)    nRBC 0.5 (*)    All other components within  normal limits  URINALYSIS, ROUTINE W REFLEX MICROSCOPIC - Abnormal; Notable for the following components:   Color, Urine STRAW (*)    pH 9.0 (*)    Glucose, UA 150 (*)    Hgb urine dipstick SMALL (*)    Ketones, ur 20 (*)  Bacteria, UA RARE (*)    All other components within normal limits  MAGNESIUM - Abnormal; Notable for the following components:   Magnesium 1.3 (*)    All other components within normal limits  LIPASE, BLOOD  I-STAT BETA HCG BLOOD, ED ([facility], [facility], [facility] ONLY)    EKG None  Radiology CT ABDOMEN PELVIS W CONTRAST  Result Date: 06/24/2021 CLINICAL DATA:  Diffuse acute abdominal pain EXAM: CT ABDOMEN AND PELVIS WITH CONTRAST TECHNIQUE: Multidetector CT imaging of the abdomen and pelvis was performed using the standard protocol following bolus administration of intravenous contrast. RADIATION DOSE REDUCTION: This exam was performed according to the departmental dose-optimization program which includes automated exposure control, adjustment of the mA and/or kV according to patient size and/or use of iterative reconstruction technique. CONTRAST:  146m OMNIPAQUE IOHEXOL 300 MG/ML  SOLN COMPARISON:  Prior CT scan of the abdomen and pelvis 06/16/2021 FINDINGS: Lower chest: 4 mm nonspecific pulmonary nodule in the posterior right lower lobe (image 2 series 4) approximately 7 mm subpleural nodule in the periphery of the right lower lobe also seen on the same image slice. Smaller subpleural nodule/lymph nodes scattered along the pleural surface of the left lower lobe, none measuring larger than 7 mm. The lung bases are otherwise clear. The visualized cardiac structures are within normal limits. Unremarkable distal thoracic esophagus. Hepatobiliary: Low attenuation of the hepatic parenchyma with focal sparing around the gallbladder fossa. No discrete hepatic lesion. No intra or extrahepatic biliary ductal dilatation. Diffuse mild gallbladder wall edema without evidence of distension or  cholelithiasis. No pericholecystic inflammatory changes. Pancreas: Unremarkable. No pancreatic ductal dilatation or surrounding inflammatory changes. Spleen: Prior splenectomy. Adrenals/Urinary Tract: Normal adrenal glands. The right kidney is normal in appearance. Approximately 6 mm stone in the lower pole of the left collecting system. Interval removal of ureteral stent. No hydronephrosis. Mild left ureterectasis. The bladder is decompressed. Stomach/Bowel: Normal appearance of the stomach and duodenum. No evidence of obstruction or focal bowel wall thickening. Normal appendix in the right lower quadrant. The terminal ileum is unremarkable. Vascular/Lymphatic: No evidence of aneurysm. Scattered atherosclerotic plaque. No suspicious lymphadenopathy. Reproductive: Status post hysterectomy. No adnexal masses. Other: Omental fat containing midline ventral abdominal wall hernia, unchanged. New high attenuation nodular opacity in the superficial subcutaneous fat of the right lower quadrant abdominal wall likely reflecting a small contusion given appearance over the 8 days. Musculoskeletal: No acute or significant osseous findings. IMPRESSION: 1. No acute abnormality within the abdomen or pelvis. 2. Interval removal of left-sided double-J ureteral stent. Persistent mild left ureterectasis with inflammatory stranding in the Peri ureteral fat. Findings suggest residual inflammation, or less likely an infectious process (considered very unlikely). 3. Multiple small bilateral pulmonary nodules and subpleural nodules/lymph nodes. Non-contrast chest CT at 3-6 months is recommended. If the nodules are stable at time of repeat CT, then future CT at 18-24 months (from today's scan) is considered optional for low-risk patients, but is recommended for high-risk patients. This recommendation follows the consensus statement: Guidelines for Management of Incidental Pulmonary Nodules Detected on CT Images: From the Fleischner Society  2017; Radiology 2017; 284:228-243. 4.  Aortic Atherosclerosis (ICD10-170.0) 5. Stable moderately large omental fat containing midline ventral hernia. Electronically Signed   By: HJacqulynn CadetM.D.   On: 06/24/2021 13:55    Procedures Procedures    Medications Ordered in ED Medications  magnesium sulfate IVPB 2 g 50 mL (has no administration in time range)  sodium chloride 0.9 % bolus 500 mL (has no administration  in time range)  0.9 %  sodium chloride infusion (has no administration in time range)  sodium chloride (PF) 0.9 % injection (10 mLs  Given 06/24/21 1545)  iohexol (OMNIPAQUE) 300 MG/ML solution 100 mL (100 mLs Intravenous Contrast Given 06/24/21 1331)  sodium chloride 0.9 % bolus 1,000 mL (0 mLs Intravenous Stopped 06/24/21 1732)  fentaNYL (SUBLIMAZE) injection 50 mcg (50 mcg Intravenous Given 06/24/21 1544)  ondansetron (ZOFRAN) injection 4 mg (4 mg Intravenous Given 06/24/21 1544)  potassium chloride SA (KLOR-CON M) CR tablet 40 mEq (40 mEq Oral Given 06/24/21 1548)  ketorolac (TORADOL) 30 MG/ML injection 15 mg (15 mg Intravenous Given 06/24/21 1715)  HYDROmorphone (DILAUDID) injection 1 mg (1 mg Intravenous Given 06/24/21 1853)    ED Course/ Medical Decision Making/ A&P                           Medical Decision Making Patient with history of diabetes, renal stones, AKI who had a recent stent placed for ureteral stones, presenting with left flank pain.  Ureteral stent has fallen out.  She has symptoms concerning for pyelonephritis, persistent ureteral blockage, metabolic disorders and hemodynamic disorders.  She also may have simple gastroenteritis causing her symptoms.  She requires evaluation with laboratory testing, urinalysis, CT imaging.  Problems Addressed: Hypokalemia: acute illness or injury that poses a threat to life or bodily functions Left flank pain: acute illness or injury    Details: Unrelenting despite recent treatment Pulmonary nodule: chronic illness or  injury    Details: Incidental CT finding Vomiting and diarrhea: acute illness or injury    Details: Onset after treatment with antibiotic for pyelonephritis  Amount and/or Complexity of Data Reviewed Independent Historian:     Details: She is a cogent historian External Data Reviewed: labs, radiology and notes.    Details: Operative note and treatment, 3 days ago reviewed.  At that time ureteral stent placed, left side.  This is her previous stenting, complicated by intervening pyelonephritis.  The stent placed 3 days ago has fallen out.  PDMP narcotic databank reviewed by me.  MME is 61. Labs: ordered.    Details: CBC, metabolic panel, urinalysis, pregnancy test, lipase-normal except glucose high, potassium, liver,, hemoglobin low; urinalysis abnormal with presence of bacteria ketones, and glucose. Radiology: ordered and independent interpretation performed.    Details: CT abdomen pelvis renal protocol pulmonary nodules, absence of ureteral stent.  No hydronephrosis or apparent hydroureter.  No evidence for obstructing kidney stone. Discussion of management or test interpretation with external provider(s): Case discussed with urology, Dr. Michaelle Birks.  We reviewed the CT images, CBC and urinalysis results.  He reviewed the CT images.  He recommends symptomatic care, and states she does not require urologic intervention.  Hospitalist consulted to admit for intractable pain with vomiting and diarrhea.  Risk Prescription drug management. Decision regarding hospitalization. Risk Details: Patient presenting with flank pain, following recent treatment with ureteral stenting and treatment for pyelonephritis with antibiotics.  Findings today indicating inflammatory changes of the ureter, secondary to stenting, with less likely cause directly from infection.  She uses high-dose narcotic analgesia.  She has both vomiting and diarrhea raising possibility of gastroenteritis versus antibiotic  induced diarrhea.  At this time symptomatic care is indicated without need for ongoing going or other interventions by urology.  Patient has intractable pain, with persistent vomiting in the emergency department.  She has narcotic pain medicine at home but has not been able to tolerate  that due to vomiting and diarrhea.  She was recently placed on the narcotic pain medicine by urology for her ongoing problems including ureteral stone and pyelonephritis.  Renal stone has been treated with removal and stenting.  She has residual ureteral area inflammation that may be causing her pain.  She therefore requires hospitalization for medication management, with IV fluids.  She is at risk for decompensation if she leaves.  She was treated for hypokalemia and hypomagnesemia in the ED.  Critical Care Total time providing critical care: 50 minutes          Final Clinical Impression(s) / ED Diagnoses Final diagnoses:  Left flank pain  Pulmonary nodule  Vomiting and diarrhea  Hypokalemia    Rx / DC Orders ED Discharge Orders     None         Daleen Bo, MD 06/24/21 1912

## 2021-06-24 NOTE — H&P (Signed)
History and Physical    Patient: Jamie Conley NOM:[id number] DOB: 1975-05-06 DOA: 06/24/2021 DOS: the patient was seen and examined on 06/25/2021 PCP: Gildardo Pounds, NP  Patient coming from: Home  Chief Complaint:  Chief Complaint  Patient presents with   Abdominal Pain   HPI: Jamie Conley is a 46 y.o. female with medical history significant of splenectomy, hypertension, insulin-dependent type 2 diabetes and recurrent nephrolithiasis requiring intervention who presents with left flank pain.  She was recently hospitalized from 5/10 to 06/23/2021 for left flank pain and found to have sepsis secondary to acute pyelonephritis with UA positive for E. coli with insignificant colonies. She underwent left ureteral stent placement on 5/3 prior to admission and subsequently had left ureteroscopic stone manipulation with basket extraction and left 6 x22 JJ ureteral stent on 5/16 and advised to remove on 5/19 but stent fell out after discharge on 5/17.  Patient has had persistent left flank pain radiating to the lower abdomen after discharge.  She has p.o. 4 mg Dilaudid and 10 mg Percocets at home but was unable to tolerate due to nausea and vomiting.  Has noted up to 15 episodes of diarrhea since last night.  Also notes increasing urine frequency but no dysuria or urgency.  Has felt chills but no objective fever.  In the ED, she was afebrile and mildly hypertensive up to SBP of 170 on room air.  Has persistent leukocytosis of 22 K and thrombocytosis of 572.  UA was negative for leukocyte nitrite with rare bacteria.  Has mild hypokalemia with potassium of 3.2, glucose of 272, creatinine normal at 0.81.  Has hypomagnesemia of 1.3.  CT abdomen and pelvis showed 6 mm stone in the lower pole of the left collecting renal system and persistent mild left ureterectasis with inflammatory stranding likely suggestive of residual inflammation rather than infectious process.  ED physician  discussed with urology Dr. Felipa Eth who recommended pain management and no further intervention at this time.  Hospitalist on-call for admission.  Review of Systems: As mentioned in the history of present illness. All other systems reviewed and are negative. Past Medical History:  Diagnosis Date   Anxiety    Asthma    Cancer ([place])    Depression    Diabetes mellitus without complication ([place])    Enlarged liver    Fibromyalgia    High cholesterol    History of kidney stones    Hypertension    Migraine    Past Surgical History:  Procedure Laterality Date   ABDOMINAL HYSTERECTOMY     ANTERIOR AND POSTERIOR REPAIR     colon polyectomy     CYSTOSCOPY WITH RETROGRADE PYELOGRAM, URETEROSCOPY AND STENT PLACEMENT Left 06/09/2021   Procedure: CYSTOSCOPY WITH RETROGRADE PYELOGRAM, URETEROSCOPY AND STENT PLACEMENT;  Surgeon: Cleon Gustin, MD;  Location: [facility];  Service: Urology;  Laterality: Left;   CYSTOSCOPY WITH RETROGRADE PYELOGRAM, URETEROSCOPY AND STENT PLACEMENT Left 06/22/2021   Procedure: CYSTOSCOPY WITH RETROGRADE PYELOGRAM, URETEROSCOPY AND STENT EXCHANGE;  Surgeon: Cleon Gustin, MD;  Location: [facility];  Service: Urology;  Laterality: Left;   CYSTOSCOPY/URETEROSCOPY/HOLMIUM LASER/STENT PLACEMENT Left 01/12/2021   Procedure: CYSTOSCOPY/URETEROSCOPY/HOLMIUM LASER/STENT PLACEMENT;  Surgeon: Vira Agar, MD;  Location: [facility];  Service: Urology;  Laterality: Left;   LEFT OOPHORECTOMY     LITHOTRIPSY     NASAL POLYP SURGERY     SPLENECTOMY     WRIST SURGERY Left    Social History:  reports that she has never smoked.  She has never used smokeless tobacco. She reports current drug use. She reports that she does not drink alcohol.  No Known Allergies  Family History  Problem Relation Age of Onset   Hypertension Mother    High Cholesterol Mother    Heart attack Father     Prior to Admission medications   Medication Sig Start Date End Date Taking? Authorizing Provider   atorvastatin (LIPITOR) 40 MG tablet Take 1 tablet (40 mg total) by mouth daily. 01/01/21  Yes Amin, Ankit Chirag, MD  busPIRone (BUSPAR) 15 MG tablet Take 1 tablet (15 mg total) by mouth 2 (two) times daily. 02/19/21  Yes Gildardo Pounds, NP  diphenhydrAMINE (BENADRYL) 25 MG tablet Take 25 mg by mouth every 6 (six) hours as needed for allergies.   Yes [provider]  Dulaglutide (TRULICITY) 8.29 HB/7.1IR SOPN Inject 0.75 mg into the skin once a week. Patient taking differently: Inject 0.75 mg into the skin once a week. Friday 06/16/21  Yes Charlott Rakes, MD  escitalopram (LEXAPRO) 20 MG tablet Take 1 tablet (20 mg total) by mouth daily. 02/19/21 07/03/21 Yes Gildardo Pounds, NP  gabapentin (NEURONTIN) 300 MG capsule Take 1 capsule (300 mg total) by mouth 3 (three) times daily. 02/19/21 06/24/21 Yes Gildardo Pounds, NP  HYDROmorphone (DILAUDID) 4 MG tablet Take 1 tablet (4 mg total) by mouth every 4 (four) hours as needed for severe pain. 06/15/21  Yes McKenzie, Candee Furbish, MD  insulin aspart protamine- aspart (NOVOLOG MIX 70/30) (70-30) 100 UNIT/ML injection Inject 20 Units into the skin 2 (two) times daily with a meal.   Yes [provider]  insulin detemir (LEVEMIR) 100 UNIT/ML FlexPen inject 20 units into the skin at bedtime Patient taking differently: Inject 20 Units into the skin at bedtime. 02/19/21  Yes   lisinopril (ZESTRIL) 20 MG tablet Take 1 tablet (20 mg total) by mouth daily. 02/19/21  Yes Gildardo Pounds, NP  metFORMIN (GLUCOPHAGE) 1000 MG tablet Take 1 tablet (1,000 mg total) by mouth 2 (two) times daily with a meal. 02/19/21  Yes Gildardo Pounds, NP  ondansetron (ZOFRAN) 4 MG tablet Take 1 tablet (4 mg total) by mouth daily as needed for nausea or vomiting. 06/09/21 06/09/22 Yes McKenzie, Candee Furbish, MD  oxyCODONE-acetaminophen (PERCOCET) 10-325 MG tablet Take 1 tablet by mouth every 6 (six) hours as needed for pain. 06/23/21  Yes Pokhrel, Laxman, MD  sodium chloride (OCEAN)  0.65 % nasal spray Place 1 spray into the nose daily.   Yes [provider]  tamsulosin (FLOMAX) 0.4 MG CAPS capsule Take 1 capsule (0.4 mg total) by mouth daily after supper. 06/09/21  Yes McKenzie, Candee Furbish, MD  traZODone (DESYREL) 150 MG tablet Take 1 tablet (150 mg total) by mouth at bedtime as needed for sleep. Patient taking differently: Take 150 mg by mouth at bedtime. 02/19/21 07/03/21 Yes Gildardo Pounds, NP  blood glucose meter kit and supplies KIT Dispense based on patient and insurance preference. Use up to four times daily as directed. 01/01/21   Amin, Jeanella Flattery, MD  cefUROXime (CEFTIN) 500 MG tablet Take 1 tablet (500 mg total) by mouth 2 (two) times daily with a meal for 3 days. Patient not taking: Reported on 06/24/2021 06/23/21 06/26/21  Pokhrel, Corrie Mckusick, MD  insulin aspart (NOVOLOG FLEXPEN) 100 UNIT/ML FlexPen For blood sugars 0-150 give 0 units of insulin, 151-200 give 2 units of insulin, 201-250 give 4 units, 251-300 give 6 units, 301-350 give 8 units,  351-400 give 10 units,> 400 give 12 units and call M.D. Discussed hypoglycemia protocol. Patient not taking: Reported on 06/24/2021 02/19/21   Gildardo Pounds, NP  Insulin Pen Needle 31G X 5 MM MISC 100 each by Does not apply route 2 (two) times daily. 02/19/21   Gildardo Pounds, NP    Physical Exam: Vitals:   06/24/21 1800 06/24/21 1830 06/24/21 1900 06/24/21 2225  BP: 138/69 (!) 156/81 (!) 141/84 (!) 143/85  Pulse: 68 68 83 73  Resp: '18 18 18 18  ' Temp:    98.8 F (37.1 C)  TempSrc:    Oral  SpO2: 97% 94% 96% 98%  Weight:    107.9 kg  Height:    '5\' 3"'  (1.6 m)   Constitutional: NAD, calm, comfortable, nontoxic appearing obese female sitting upright at the edge of her bed Eyes: lids and conjunctivae normal ENMT: Mucous membranes are moist.  Neck: normal, supple Respiratory: clear to auscultation bilaterally, no wheezing, no crackles. Normal respiratory effort. No accessory muscle use.  Cardiovascular: Regular rate  and rhythm, no murmurs / rubs / gallops.  Trace nonpitting edema of the distal bilateral lower extremity. Abdomen: Moderate tenderness to left lower quadrant and left CVA tenderness without any guarding, rebound tenderness or rigidity.  Bowel sounds positive.  Musculoskeletal: no clubbing / cyanosis. No joint deformity upper and lower extremities. Good ROM, no contractures. Normal muscle tone.  Skin: no rashes, lesions, ulcers.  Neurologic: CN 2-12 grossly intact. Strength 5/5 in all 4.  Psychiatric: Normal judgment and insight. Alert and oriented x 3. Normal mood. Data Reviewed:  See HPI  Assessment and Plan: * Flank pain Recently hospitalized from 5/10 to 06/23/2021 for left flank pain and found to have sepsis secondary to acute pyelonephritis with UA positive for E. coli with insignificant colonies. She underwent left ureteral stent placement on 5/3 prior to admission and subsequently had left ureteroscopic stone manipulation with basket extraction and left 6 x22 JJ ureteral stent on 5/16 and advised to remove on 5/19 but stent fell out after discharge on 5/17. -CT A/P more suggestive of inflammation rather than infectious process. She does still have a 31m stone in the lower pole of the left collecting renal system which could be contributing to her pain -continue PRN IV opioid pain management -strain all urine  -Urology was consulted by EDP and does not recommend any further intervention   Type 2 diabetes mellitus with hyperlipidemia (HCC) Home regimen on NovoLog 70/30 20 units twice daily, Levemir nightly at 18 units -Will start with 10 units of Levermir qHS with resistant SSI while she is not able to tolerate much p.o.  Class 3 obesity (HCC) BMI of 42  Pulmonary nodule Incidentally finding of RLL nodule. Recommend non-contrast CT in 3-6 months.  Thrombocytosis Likely reactive. Continue to monitor.  Hypomagnesemia Replete with IV Mg  Hypokalemia repleted with oral  K  Leukocytosis Unclear and possibly reactive. Leukocytosis continues to Trend upwards but she is afebrile, UA is negative and CT abd/pelvis more suggestive of inflammation rather than infectious process. -She is having significant diarrhea and was recently on antibiotics for pyelonephritis. Check C.diff although low suspicion without fever and diffuse abdominal pain.  Depression Continue BuSpar, Lexapro, trazodone      Advance Care Planning:   Code Status: Full Code   Consults: Urology Dr. SFelipa EthFamily Communication: none at bedside  Severity of Illness: The appropriate patient status for this patient is OBSERVATION. Observation status is judged to be reasonable and  necessary in order to provide the required intensity of service to ensure the patient's safety. The patient's presenting symptoms, physical exam findings, and initial radiographic and laboratory data in the context of their medical condition is felt to place them at decreased risk for further clinical deterioration. Furthermore, it is anticipated that the patient will be medically stable for discharge from the hospital within 2 midnights of admission.   Author: Orene Desanctis, DO 06/25/2021 1:44 AM  For on call review [email or web address].

## 2021-06-24 NOTE — ED Provider Triage Note (Signed)
Emergency Medicine Provider Triage Evaluation Note  Noell Lorensen , a 46 y.o. female  was evaluated in triage.  Pt complains of diffuse abdominal pain.  Patient just had a stent placed earlier this week and was just discharged from [facility] yesterday.  She is been unable to keep anything down p.o. wise.  Pain has been unbearable which went to her arrival today.  Review of Systems  Positive: Subjective fever, chills, frequency Negative:   Physical Exam  BP (!) 164/88 (BP Location: Left Arm)   Pulse 75   Temp 98 F (36.7 C) (Oral)   Resp 16   SpO2 99%  Gen:   Awake, no distress   Resp:  Normal effort  MSK:   Moves extremities without difficulty  Other:  Diffuse abdominal tenderness  Medical Decision Making  Medically screening exam initiated at 1:04 PM.  Appropriate orders placed.  [name] was informed that the remainder of the evaluation will be completed by another provider, this initial triage assessment does not replace that evaluation, and the importance of remaining in the ED until their evaluation is complete.     Myna Bright [place], [place] 06/24/21 1306

## 2021-06-24 NOTE — ED Triage Notes (Addendum)
Pt bib [facility] from home d/t N/V/D, abdominal pain and b/l flank pain. Pt had a uretal stent placed on Tuesday at [facility].  Pt given 100 mcg of fentanyl and 4 mg zofran en route.

## 2021-06-24 NOTE — Telephone Encounter (Signed)
Transition Care Management Unsuccessful Follow-up Telephone Call  Date of discharge and from where:  06/23/2021, [facility]  Attempts:  1st Attempt  Reason for unsuccessful TCM follow-up call:  Left voice message on # [phone], call back requested.  This was the second call to that phone number.  The first time I called and said hello, the person who answered hung up.   Need to discuss scheduling a hospital follow up appointment with PCP

## 2021-06-25 ENCOUNTER — Telehealth: Payer: Self-pay

## 2021-06-25 DIAGNOSIS — R911 Solitary pulmonary nodule: Secondary | ICD-10-CM | POA: Diagnosis present

## 2021-06-25 DIAGNOSIS — E876 Hypokalemia: Secondary | ICD-10-CM | POA: Diagnosis present

## 2021-06-25 DIAGNOSIS — Z79890 Hormone replacement therapy (postmenopausal): Secondary | ICD-10-CM | POA: Diagnosis not present

## 2021-06-25 DIAGNOSIS — Z87442 Personal history of urinary calculi: Secondary | ICD-10-CM | POA: Diagnosis not present

## 2021-06-25 DIAGNOSIS — Z79899 Other long term (current) drug therapy: Secondary | ICD-10-CM | POA: Diagnosis not present

## 2021-06-25 DIAGNOSIS — Z794 Long term (current) use of insulin: Secondary | ICD-10-CM | POA: Diagnosis not present

## 2021-06-25 DIAGNOSIS — D75839 Thrombocytosis, unspecified: Secondary | ICD-10-CM

## 2021-06-25 DIAGNOSIS — Z8249 Family history of ischemic heart disease and other diseases of the circulatory system: Secondary | ICD-10-CM | POA: Diagnosis not present

## 2021-06-25 DIAGNOSIS — E7800 Pure hypercholesterolemia, unspecified: Secondary | ICD-10-CM | POA: Diagnosis present

## 2021-06-25 DIAGNOSIS — R112 Nausea with vomiting, unspecified: Secondary | ICD-10-CM | POA: Diagnosis present

## 2021-06-25 DIAGNOSIS — R109 Unspecified abdominal pain: Secondary | ICD-10-CM | POA: Diagnosis not present

## 2021-06-25 DIAGNOSIS — F32A Depression, unspecified: Secondary | ICD-10-CM | POA: Diagnosis present

## 2021-06-25 DIAGNOSIS — Z90710 Acquired absence of both cervix and uterus: Secondary | ICD-10-CM | POA: Diagnosis not present

## 2021-06-25 DIAGNOSIS — I10 Essential (primary) hypertension: Secondary | ICD-10-CM | POA: Diagnosis present

## 2021-06-25 DIAGNOSIS — R1031 Right lower quadrant pain: Secondary | ICD-10-CM | POA: Diagnosis present

## 2021-06-25 DIAGNOSIS — E1169 Type 2 diabetes mellitus with other specified complication: Secondary | ICD-10-CM | POA: Diagnosis present

## 2021-06-25 DIAGNOSIS — Z9081 Acquired absence of spleen: Secondary | ICD-10-CM | POA: Diagnosis not present

## 2021-06-25 DIAGNOSIS — Z6841 Body Mass Index (BMI) 40.0 and over, adult: Secondary | ICD-10-CM | POA: Diagnosis not present

## 2021-06-25 DIAGNOSIS — Z83438 Family history of other disorder of lipoprotein metabolism and other lipidemia: Secondary | ICD-10-CM | POA: Diagnosis not present

## 2021-06-25 DIAGNOSIS — F419 Anxiety disorder, unspecified: Secondary | ICD-10-CM | POA: Diagnosis present

## 2021-06-25 DIAGNOSIS — R197 Diarrhea, unspecified: Secondary | ICD-10-CM | POA: Diagnosis present

## 2021-06-25 DIAGNOSIS — Z7985 Long-term (current) use of injectable non-insulin antidiabetic drugs: Secondary | ICD-10-CM | POA: Diagnosis not present

## 2021-06-25 DIAGNOSIS — M797 Fibromyalgia: Secondary | ICD-10-CM | POA: Diagnosis present

## 2021-06-25 LAB — CBC
HCT: 29.2 % — ABNORMAL LOW (ref 36.0–46.0)
Hemoglobin: 9.3 g/dL — ABNORMAL LOW (ref 12.0–15.0)
MCH: 31.6 pg (ref 26.0–34.0)
MCHC: 31.8 g/dL (ref 30.0–36.0)
MCV: 99.3 fL (ref 80.0–100.0)
Platelets: 579 10*3/uL — ABNORMAL HIGH (ref 150–400)
RBC: 2.94 MIL/uL — ABNORMAL LOW (ref 3.87–5.11)
RDW: 18 % — ABNORMAL HIGH (ref 11.5–15.5)
WBC: 22.6 10*3/uL — ABNORMAL HIGH (ref 4.0–10.5)
nRBC: 0.4 % — ABNORMAL HIGH (ref 0.0–0.2)

## 2021-06-25 LAB — GLUCOSE, CAPILLARY
Glucose-Capillary: 124 mg/dL — ABNORMAL HIGH (ref 70–99)
Glucose-Capillary: 130 mg/dL — ABNORMAL HIGH (ref 70–99)
Glucose-Capillary: 121 mg/dL — ABNORMAL HIGH (ref 70–99)
Glucose-Capillary: 102 mg/dL — ABNORMAL HIGH (ref 70–99)

## 2021-06-25 LAB — BASIC METABOLIC PANEL
Anion gap: 6 (ref 5–15)
BUN: 14 mg/dL (ref 6–20)
CO2: 25 mmol/L (ref 22–32)
Calcium: 8.3 mg/dL — ABNORMAL LOW (ref 8.9–10.3)
Chloride: 109 mmol/L (ref 98–111)
Creatinine, Ser: 0.81 mg/dL (ref 0.44–1.00)
GFR, Estimated: >60 mL/min (ref >60)
Glucose, Bld: 165 mg/dL — ABNORMAL HIGH (ref 70–99)
Potassium: 3.1 mmol/L — ABNORMAL LOW (ref 3.5–5.1)
Sodium: 140 mmol/L (ref 135–145)

## 2021-06-25 LAB — MAGNESIUM: Magnesium: 1.9 mg/dL (ref 1.7–2.4)

## 2021-06-25 MED ORDER — TAMSULOSIN HCL 0.4 MG PO CAPS
0.4000 mg | ORAL_CAPSULE | Freq: Every day | ORAL | Status: DC
  Administered 2021-06-25 – 2021-06-27 (×3): 0.4 mg via ORAL
  Filled 2021-06-25 (×3): qty 1

## 2021-06-25 MED ORDER — POTASSIUM CHLORIDE IN NACL 40-0.9 MEQ/L-% IV SOLN
INTRAVENOUS | Status: DC
Start: 2021-06-25 — End: 2021-06-26
  Filled 2021-06-25 (×4): qty 1000

## 2021-06-25 MED ORDER — POTASSIUM CHLORIDE CRYS ER 20 MEQ PO TBCR
40.0000 meq | EXTENDED_RELEASE_TABLET | ORAL | Status: AC
  Administered 2021-06-25 (×2): 40 meq via ORAL
  Filled 2021-06-25 (×2): qty 2

## 2021-06-25 MED ORDER — ATORVASTATIN CALCIUM 40 MG PO TABS
40.0000 mg | ORAL_TABLET | Freq: Every day | ORAL | Status: DC
  Administered 2021-06-25 – 2021-06-28 (×4): 40 mg via ORAL
  Filled 2021-06-25 (×4): qty 1

## 2021-06-25 MED ORDER — ESCITALOPRAM OXALATE 20 MG PO TABS
20.0000 mg | ORAL_TABLET | Freq: Every day | ORAL | Status: DC
  Administered 2021-06-25 – 2021-06-28 (×4): 20 mg via ORAL
  Filled 2021-06-25 (×4): qty 1

## 2021-06-25 MED ORDER — MAGNESIUM SULFATE 2 GM/50ML IV SOLN
2.0000 g | Freq: Once | INTRAVENOUS | Status: AC
  Administered 2021-06-25: 2 g via INTRAVENOUS
  Filled 2021-06-25: qty 50

## 2021-06-25 MED ORDER — LISINOPRIL 20 MG PO TABS
20.0000 mg | ORAL_TABLET | Freq: Every day | ORAL | Status: DC
  Administered 2021-06-25 – 2021-06-28 (×4): 20 mg via ORAL
  Filled 2021-06-25 (×4): qty 1

## 2021-06-25 MED ORDER — POTASSIUM CHLORIDE 10 MEQ/100ML IV SOLN
10.0000 meq | INTRAVENOUS | Status: DC
  Administered 2021-06-25: 10 meq via INTRAVENOUS
  Filled 2021-06-25: qty 100

## 2021-06-25 NOTE — Assessment & Plan Note (Signed)
Replete with IV Mg 

## 2021-06-25 NOTE — Assessment & Plan Note (Signed)
repleted with oral K

## 2021-06-25 NOTE — Assessment & Plan Note (Signed)
Continue BuSpar, Lexapro, trazodone

## 2021-06-25 NOTE — Assessment & Plan Note (Signed)
BMI of 42 

## 2021-06-25 NOTE — Assessment & Plan Note (Signed)
Unclear and possibly reactive. Leukocytosis continues to Trend upwards but she is afebrile, UA is negative and CT abd/pelvis more suggestive of inflammation rather than infectious process. -She is having significant diarrhea and was recently on antibiotics for pyelonephritis. Check C.diff although low suspicion without fever and diffuse abdominal pain.

## 2021-06-25 NOTE — Assessment & Plan Note (Addendum)
Recently hospitalized from 5/10 to 06/23/2021 for left flank pain and found to have sepsis secondary to acute pyelonephritis with UA positive for E. coli with insignificant colonies. She underwent left ureteral stent placement on 5/3 prior to admission and subsequently had left ureteroscopic stone manipulation with basket extraction and left 6 x22 JJ ureteral stent on 5/16 and advised to remove on 5/19 but stent fell out after discharge on 5/17. -CT A/P more suggestive of inflammation rather than infectious process. She does still have a 51m stone in the lower pole of the left collecting renal system which could be contributing to her pain -continue PRN IV opioid pain management -strain all urine  -Urology was consulted by EDP and does not recommend any further intervention

## 2021-06-25 NOTE — Telephone Encounter (Signed)
Transition Care Management Unsuccessful Follow-up Telephone Call   Date of discharge and from where:  06/23/2021, [facility]   Attempts:  2nd tempt   Reason for unsuccessful TCM follow-up call:  Pt answered stated is currently admitted at [facility] since yesterday . Felling a little better today , trying to eat something.     Advised pt will call Monday and f/u ,need to discuss scheduling a hospital follow up appointment with PCP

## 2021-06-25 NOTE — Progress Notes (Signed)
PROGRESS NOTE    Jamie Conley  OXB:[id number] DOB: December 14, 1975 DOA: 06/24/2021 PCP: Gildardo Pounds, NP   Brief Narrative: 46 year old female with history of splenectomy, type 2 diabetes and recurrent kidney stones requiring intervention admitted with intractable left flank pain nausea vomiting and diarrhea.  Patient was discharged from the hospital on 06/23/2021 after a 1 week stay for acute pyelonephritis and had left ureteral stent placed which fell off in the hospital after she was discharged.  ED physician discussed with urologist on-call and was recommended to continue pain management and no intervention was planned at this time.  CT abdomen and pelvis shows a 6 mm stone in the left pole of the left collecting system persistent mild left ureter ureterectasis with inflammatory stranding likely suggestive of residual inflammation rather than infectious process.  She was also found to be hypokalemic with a K of 3.2 mag of 1.3 creatinine was 0.81 had leukocytosis of 22,000.  She had 15 episodes of diarrhea prior to admission.  She was unable to keep anything down at home including her pain medications which she had Dilaudid and Percocet.  Assessment & Plan:   Principal Problem:   Flank pain Active Problems:   Type 2 diabetes mellitus with hyperlipidemia (HCC)   Class 3 obesity (HCC)   Depression   Leukocytosis   Hypokalemia   Hypomagnesemia   Thrombocytosis   Pulmonary nodule   Intractable nausea and vomiting   #1 intractable nausea vomiting and left flank pain patient admitted with above symptoms with recent placement of ureteral stent on 06/22/2021.  CT abdomen on this admission shows no obstruction to the ureteral collecting system, no hydronephrosis.  Urology recommends pain management symptomatic management and IV fluids.  #2  Severe hypokalemia replete and recheck labs  #3 hypomagnesemia replete mag  #4 type 2 diabetes and hyperlipidemia on Levemir 10 units nightly  with resistant SSI CBG (last 3)  Recent Labs    06/24/21 2252 06/25/21 0734 06/25/21 1142  GLUCAP 170* 124* 130*    #5 morbid obesity with a BMI of 42 complicates overall clinical picture and outcome  #6 right lower lobe lung nodule follow-up CT as an outpatient in 3 to 6 months   #7 depression on Lexapro trazodone and BuSpar  Estimated body mass index is 42.14 kg/m as calculated from the following:   Height as of this encounter: '5\' 3"'$  (1.6 m).   Weight as of this encounter: 107.9 kg.  DVT prophylaxis: Lovenox Code Status: Full code Family Communication: None at bedside Disposition Plan:  Status is: Inpatient    Consultants: None Procedures: None Antimicrobials: None Subjective: She is resting in bed complaining of nausea vomiting and severe pain has not been able to keep anything down is on clear liquid diet  Objective: Vitals:   06/24/21 2225 06/25/21 0157 06/25/21 0611 06/25/21 1027  BP: (!) 143/85 (!) 144/88 128/67 128/84  Pulse: 73 68 (!) 59 70  Resp: '18 18 20 18  '$ Temp: 98.8 F (37.1 C) 98 F (36.7 C) 98.4 F (36.9 C) 98.5 F (36.9 C)  TempSrc: Oral   Oral  SpO2: 98% 99% 95% 96%  Weight: 107.9 kg     Height: '5\' 3"'$  (1.6 m)       Intake/Output Summary (Last 24 hours) at 06/25/2021 1319 Last data filed at 06/25/2021 1026 Gross per 24 hour  Intake 360 ml  Output 100 ml  Net 260 ml   Filed Weights   06/24/21 2225  Weight: 107.9 kg  Examination:  General exam: Appears in distress Respiratory system: Clear to auscultation. Respiratory effort normal. Cardiovascular system: S1 & S2 heard, RRR. No JVD, murmurs, rubs, gallops or clicks. No pedal edema. Gastrointestinal system: Abdomen is nondistended, soft and nontender. No organomegaly or masses felt. Normal bowel sounds heard.  Left CVA tenderness in Central nervous system: Alert and oriented. No focal neurological deficits. Extremities: Symmetric 5 x 5 power. Skin: No rashes, lesions or  ulcers Psychiatry: Judgement and insight appear normal. Mood & affect appropriate.     Data Reviewed: I have personally reviewed following labs and imaging studies  CBC: Recent Labs  Lab 06/21/21 0441 06/22/21 0510 06/23/21 0529 06/24/21 1305 06/25/21 0519  WBC 17.9* 17.6* 21.6* 22.2* 22.6*  HGB 10.3* 10.3* 10.1* 11.3* 9.3*  HCT 33.1* 32.1* 32.3* 33.7* 29.2*  MCV 97.6 96.7 96.7 95.5 99.3  PLT 642* 679* 702* 572* 350*   Basic Metabolic Panel: Recent Labs  Lab 06/19/21 0530 06/21/21 0441 06/22/21 0510 06/23/21 0529 06/24/21 1305 06/25/21 0519  NA 139 141 142 138 139 140  K 3.7 3.8 3.5 3.6 3.2* 3.1*  CL 115* 111 110 106 105 109  CO2 20* '23 26 25 22 25  '$ GLUCOSE 258* 220* 221* 207* 272* 165*  BUN '18 15 15 19 11 14  '$ CREATININE 0.74 0.69 0.72 0.81 0.81 0.81  CALCIUM 8.0* 8.8* 8.7* 8.7* 9.0 8.3*  MG 1.6*  --   --   --  1.3* 1.9   GFR: Estimated Creatinine Clearance: 102.2 mL/min (by C-G formula based on SCr of 0.81 mg/dL). Liver Function Tests: Recent Labs  Lab 06/24/21 1305  AST 23  ALT 27  ALKPHOS 74  BILITOT 0.8  PROT 7.4  ALBUMIN 3.6   Recent Labs  Lab 06/24/21 1305  LIPASE 30   No results for input(s): AMMONIA in the last 168 hours. Coagulation Profile: No results for input(s): INR, PROTIME in the last 168 hours. Cardiac Enzymes: No results for input(s): CKTOTAL, CKMB, CKMBINDEX, TROPONINI in the last 168 hours. BNP (last 3 results) No results for input(s): PROBNP in the last 8760 hours. HbA1C: No results for input(s): HGBA1C in the last 72 hours. CBG: Recent Labs  Lab 06/22/21 2120 06/23/21 0710 06/24/21 2252 06/25/21 0734 06/25/21 1142  GLUCAP 252* 189* 170* 124* 130*   Lipid Profile: No results for input(s): CHOL, HDL, LDLCALC, TRIG, CHOLHDL, LDLDIRECT in the last 72 hours. Thyroid Function Tests: No results for input(s): TSH, T4TOTAL, FREET4, T3FREE, THYROIDAB in the last 72 hours. Anemia Panel: No results for input(s): VITAMINB12,  FOLATE, FERRITIN, TIBC, IRON, RETICCTPCT in the last 72 hours. Sepsis Labs: Recent Labs  Lab 06/19/21 0530 06/20/21 0611  PROCALCITON <0.10 <0.10    Recent Results (from the past 240 hour(s))  Urine Culture     Status: Abnormal   Collection Time: 06/16/21  5:18 PM   Specimen: Urine, Clean Catch  Result Value Ref Range Status   Specimen Description   Final    URINE, CLEAN CATCH Performed at [facility], [address] [place], [place] [address]    Special Requests   Final    NONE Performed at [facility], [address], [place], [place] [address]    Culture 20,000 COLONIES/mL ESCHERICHIA COLI (A)  Final   Report Status 06/19/2021 FINAL  Final   Organism ID, Bacteria ESCHERICHIA COLI (A)  Final      Susceptibility   Escherichia coli - MIC*    AMPICILLIN 4 SENSITIVE Sensitive  CEFAZOLIN <=4 SENSITIVE Sensitive     CEFEPIME <=0.12 SENSITIVE Sensitive     CEFTRIAXONE <=0.25 SENSITIVE Sensitive     CIPROFLOXACIN <=0.25 SENSITIVE Sensitive     GENTAMICIN <=1 SENSITIVE Sensitive     IMIPENEM <=0.25 SENSITIVE Sensitive     NITROFURANTOIN <=16 SENSITIVE Sensitive     TRIMETH/SULFA <=20 SENSITIVE Sensitive     AMPICILLIN/SULBACTAM <=2 SENSITIVE Sensitive     PIP/TAZO <=4 SENSITIVE Sensitive     * 20,000 COLONIES/mL ESCHERICHIA COLI  Culture, blood (Routine X 2) w Reflex to ID Panel     Status: None   Collection Time: 06/16/21 11:45 PM   Specimen: Right Antecubital; Blood  Result Value Ref Range Status   Specimen Description RIGHT ANTECUBITAL  Final   Special Requests   Final    BOTTLES DRAWN AEROBIC AND ANAEROBIC Blood Culture results may not be optimal due to an excessive volume of blood received in culture bottles   Culture   Final    NO GROWTH 5 DAYS Performed at [facility], [address], [place], [place] [address]    Report Status 06/22/2021 FINAL  Final  Culture, blood (Routine X 2) w Reflex to ID Panel     Status: None   Collection Time: 06/16/21 11:45 PM    Specimen: BLOOD LEFT FOREARM  Result Value Ref Range Status   Specimen Description BLOOD LEFT FOREARM  Final   Special Requests   Final    BOTTLES DRAWN AEROBIC AND ANAEROBIC Blood Culture results may not be optimal due to an excessive volume of blood received in culture bottles   Culture   Final    NO GROWTH 5 DAYS Performed at [facility], [address] [place], [place] [address]    Report Status 06/22/2021 FINAL  Final  Surgical pcr screen     Status: None   Collection Time: 06/22/21 12:06 AM   Specimen: Nasal Mucosa; Nasal Swab  Result Value Ref Range Status   MRSA, PCR NEGATIVE NEGATIVE Final   Staphylococcus aureus NEGATIVE NEGATIVE Final    Comment: (NOTE) The Xpert SA Assay (FDA approved for NASAL specimens in patients 47 years of age and older), is one component of a comprehensive surveillance program. It is not intended to diagnose infection nor to guide or monitor treatment. Performed at [facility], [address] [place],  [address]          Radiology Studies: CT ABDOMEN PELVIS W CONTRAST  Result Date: 06/24/2021 CLINICAL DATA:  Diffuse acute abdominal pain EXAM: CT ABDOMEN AND PELVIS WITH CONTRAST TECHNIQUE: Multidetector CT imaging of the abdomen and pelvis was performed using the standard protocol following bolus administration of intravenous contrast. RADIATION DOSE REDUCTION: This exam was performed according to the departmental dose-optimization program which includes automated exposure control, adjustment of the mA and/or kV according to patient size and/or use of iterative reconstruction technique. CONTRAST:  136m OMNIPAQUE IOHEXOL 300 MG/ML  SOLN COMPARISON:  Prior CT scan of the abdomen and pelvis 06/16/2021 FINDINGS: Lower chest: 4 mm nonspecific pulmonary nodule in the posterior right lower lobe (image 2 series 4) approximately 7 mm subpleural nodule in the periphery of the right lower lobe also seen on the same image slice. Smaller subpleural  nodule/lymph nodes scattered along the pleural surface of the left lower lobe, none measuring larger than 7 mm. The lung bases are otherwise clear. The visualized cardiac structures are within normal limits. Unremarkable distal thoracic esophagus. Hepatobiliary: Low attenuation of the hepatic parenchyma with focal  sparing around the gallbladder fossa. No discrete hepatic lesion. No intra or extrahepatic biliary ductal dilatation. Diffuse mild gallbladder wall edema without evidence of distension or cholelithiasis. No pericholecystic inflammatory changes. Pancreas: Unremarkable. No pancreatic ductal dilatation or surrounding inflammatory changes. Spleen: Prior splenectomy. Adrenals/Urinary Tract: Normal adrenal glands. The right kidney is normal in appearance. Approximately 6 mm stone in the lower pole of the left collecting system. Interval removal of ureteral stent. No hydronephrosis. Mild left ureterectasis. The bladder is decompressed. Stomach/Bowel: Normal appearance of the stomach and duodenum. No evidence of obstruction or focal bowel wall thickening. Normal appendix in the right lower quadrant. The terminal ileum is unremarkable. Vascular/Lymphatic: No evidence of aneurysm. Scattered atherosclerotic plaque. No suspicious lymphadenopathy. Reproductive: Status post hysterectomy. No adnexal masses. Other: Omental fat containing midline ventral abdominal wall hernia, unchanged. New high attenuation nodular opacity in the superficial subcutaneous fat of the right lower quadrant abdominal wall likely reflecting a small contusion given appearance over the 8 days. Musculoskeletal: No acute or significant osseous findings. IMPRESSION: 1. No acute abnormality within the abdomen or pelvis. 2. Interval removal of left-sided double-J ureteral stent. Persistent mild left ureterectasis with inflammatory stranding in the Peri ureteral fat. Findings suggest residual inflammation, or less likely an infectious process  (considered very unlikely). 3. Multiple small bilateral pulmonary nodules and subpleural nodules/lymph nodes. Non-contrast chest CT at 3-6 months is recommended. If the nodules are stable at time of repeat CT, then future CT at 18-24 months (from today's scan) is considered optional for low-risk patients, but is recommended for high-risk patients. This recommendation follows the consensus statement: Guidelines for Management of Incidental Pulmonary Nodules Detected on CT Images: From the Fleischner Society 2017; Radiology 2017; 284:228-243. 4.  Aortic Atherosclerosis (ICD10-170.0) 5. Stable moderately large omental fat containing midline ventral hernia. Electronically Signed   By: Jacqulynn Cadet M.D.   On: 06/24/2021 13:55        Scheduled Meds:  atorvastatin  40 mg Oral Daily   busPIRone  15 mg Oral BID   enoxaparin (LOVENOX) injection  40 mg Subcutaneous Q24H   escitalopram  20 mg Oral Daily   insulin aspart  0-20 Units Subcutaneous TID WC   insulin detemir  10 Units Subcutaneous QHS   lisinopril  20 mg Oral Daily   tamsulosin  0.4 mg Oral QPC supper   traZODone  150 mg Oral QHS   Continuous Infusions:  0.9 % NaCl with KCl 40 mEq / L 100 mL/hr at 06/25/21 1001     LOS: 0 days    Time spent: 76 min  Georgette Shell, MD 06/25/2021, 1:19 PM

## 2021-06-25 NOTE — Assessment & Plan Note (Signed)
Likely reactive. Continue to monitor. 

## 2021-06-25 NOTE — Assessment & Plan Note (Signed)
Incidentally finding of RLL nodule. Recommend non-contrast CT in 3-6 months.

## 2021-06-25 NOTE — Assessment & Plan Note (Signed)
Home regimen on NovoLog 70/30 20 units twice daily, Levemir nightly at 18 units -Will start with 10 units of Levermir qHS with resistant SSI while she is not able to tolerate much p.o.

## 2021-06-26 DIAGNOSIS — R109 Unspecified abdominal pain: Secondary | ICD-10-CM | POA: Diagnosis not present

## 2021-06-26 LAB — GASTROINTESTINAL PANEL BY PCR, STOOL (REPLACES STOOL CULTURE)

## 2021-06-26 LAB — COMPREHENSIVE METABOLIC PANEL
ALT: 19 U/L (ref 0–44)
AST: 14 U/L — ABNORMAL LOW (ref 15–41)
Albumin: 2.9 g/dL — ABNORMAL LOW (ref 3.5–5.0)
Alkaline Phosphatase: 66 U/L (ref 38–126)
Anion gap: 6 (ref 5–15)
BUN: 12 mg/dL (ref 6–20)
CO2: 26 mmol/L (ref 22–32)
Calcium: 8.3 mg/dL — ABNORMAL LOW (ref 8.9–10.3)
Chloride: 111 mmol/L (ref 98–111)
Creatinine, Ser: 0.77 mg/dL (ref 0.44–1.00)
GFR, Estimated: >60 mL/min (ref >60)
Glucose, Bld: 156 mg/dL — ABNORMAL HIGH (ref 70–99)
Potassium: 4.4 mmol/L (ref 3.5–5.1)
Sodium: 143 mmol/L (ref 135–145)
Total Bilirubin: 0.6 mg/dL (ref 0.3–1.2)
Total Protein: 5.9 g/dL — ABNORMAL LOW (ref 6.5–8.1)

## 2021-06-26 LAB — CBC
HCT: 31.1 % — ABNORMAL LOW (ref 36.0–46.0)
Hemoglobin: 10.1 g/dL — ABNORMAL LOW (ref 12.0–15.0)
MCH: 32.3 pg (ref 26.0–34.0)
MCHC: 32.5 g/dL (ref 30.0–36.0)
MCV: 99.4 fL (ref 80.0–100.0)
Platelets: 650 10*3/uL — ABNORMAL HIGH (ref 150–400)
RBC: 3.13 MIL/uL — ABNORMAL LOW (ref 3.87–5.11)
RDW: 17.7 % — ABNORMAL HIGH (ref 11.5–15.5)
WBC: 20.1 10*3/uL — ABNORMAL HIGH (ref 4.0–10.5)
nRBC: 0.7 % — ABNORMAL HIGH (ref 0.0–0.2)

## 2021-06-26 LAB — C DIFFICILE QUICK SCREEN W PCR REFLEX
C Diff antigen: NEGATIVE
C Diff interpretation: NOT DETECTED
C Diff toxin: NEGATIVE

## 2021-06-26 LAB — GLUCOSE, CAPILLARY
Glucose-Capillary: 149 mg/dL — ABNORMAL HIGH (ref 70–99)
Glucose-Capillary: 143 mg/dL — ABNORMAL HIGH (ref 70–99)
Glucose-Capillary: 131 mg/dL — ABNORMAL HIGH (ref 70–99)
Glucose-Capillary: 123 mg/dL — ABNORMAL HIGH (ref 70–99)

## 2021-06-26 MED ORDER — HYDROMORPHONE HCL 1 MG/ML IJ SOLN
1.5000 mg | INTRAMUSCULAR | Status: DC | PRN
  Administered 2021-06-26: 1.5 mg via INTRAVENOUS
  Filled 2021-06-26: qty 1.5

## 2021-06-26 MED ORDER — MORPHINE SULFATE (PF) 2 MG/ML IV SOLN
2.0000 mg | INTRAVENOUS | Status: DC | PRN
  Administered 2021-06-26 – 2021-06-27 (×2): 2 mg via INTRAVENOUS
  Filled 2021-06-26 (×2): qty 1

## 2021-06-26 MED ORDER — ENSURE MAX PROTEIN PO LIQD
11.0000 [oz_av] | Freq: Every day | ORAL | Status: DC
  Filled 2021-06-26 (×3): qty 330

## 2021-06-26 MED ORDER — OXYCODONE HCL ER 15 MG PO T12A
15.0000 mg | EXTENDED_RELEASE_TABLET | Freq: Two times a day (BID) | ORAL | Status: DC
  Administered 2021-06-26 – 2021-06-28 (×4): 15 mg via ORAL
  Filled 2021-06-26 (×4): qty 1

## 2021-06-26 MED ORDER — HYDROMORPHONE HCL 1 MG/ML IJ SOLN: 2.0000 mg | INTRAMUSCULAR | Status: DC | PRN

## 2021-06-26 MED ORDER — HYDROMORPHONE HCL 1 MG/ML IJ SOLN
2.0000 mg | INTRAMUSCULAR | Status: DC | PRN
  Administered 2021-06-26 – 2021-06-27 (×5): 2 mg via INTRAVENOUS
  Filled 2021-06-26 (×6): qty 2

## 2021-06-26 MED ORDER — SODIUM CHLORIDE 0.9 % IV SOLN: INTRAVENOUS | Status: DC

## 2021-06-26 NOTE — Progress Notes (Signed)
PROGRESS NOTE    Jamie Conley  XFG:[id number] DOB: 1975-12-04 DOA: 06/24/2021 PCP: Gildardo Pounds, NP   Brief Narrative: 46 year old female with history of splenectomy, type 2 diabetes and recurrent kidney stones requiring intervention admitted with intractable left flank pain nausea vomiting and diarrhea.  Patient was discharged from the hospital on 06/23/2021 after a 1 week stay for acute pyelonephritis and had left ureteral stent placed which fell off in the hospital after she was discharged.  ED physician discussed with urologist on-call and was recommended to continue pain management and no intervention was planned at this time.  CT abdomen and pelvis shows a 6 mm stone in the left pole of the left collecting system persistent mild left ureter ureterectasis with inflammatory stranding likely suggestive of residual inflammation rather than infectious process.  She was also found to be hypokalemic with a K of 3.2 mag of 1.3 creatinine was 0.81 had leukocytosis of 22,000.  She had 15 episodes of diarrhea prior to admission.  She was unable to keep anything down at home including her pain medications which she had Dilaudid and Percocet.  Assessment & Plan:   Principal Problem:   Flank pain Active Problems:   Type 2 diabetes mellitus with hyperlipidemia (HCC)   Class 3 obesity (HCC)   Depression   Leukocytosis   Hypokalemia   Hypomagnesemia   Thrombocytosis   Pulmonary nodule   Intractable nausea and vomiting   #1 intractable nausea vomiting and left flank pain patient admitted with above symptoms with recent placement of ureteral stent on 06/22/2021.   CT abdomen on this admission shows no obstruction to the ureteral collecting system, no hydronephrosis.  Urology recommends pain management symptomatic management and IV fluids. Patient has been on Dilaudid and morphine at night and yesterday still unable to take anything p.o. or keep it down She has been on a clear liquid diet  but she is unable to drink anything she even threw up water Denies diarrhea complains of being lightheaded when she stands up Continue IV fluids  #2  Severe hypokalemia repleted   #3 hypomagnesemia repleted mag  #4 type 2 diabetes and hyperlipidemia on Levemir 10 units nightly with resistant SSI CBG (last 3)  Recent Labs    06/25/21 1731 06/25/21 2137 06/26/21 0741  GLUCAP 121* 102* 149*     #5 morbid obesity with a BMI of 42 complicates overall clinical picture and outcome  #6 right lower lobe lung nodule follow-up CT as an outpatient in 3 to 6 months   #7 depression on Lexapro trazodone and BuSpar  Estimated body mass index is 42.14 kg/m as calculated from the following:   Height as of this encounter: '5\' 3"'$  (1.6 m).   Weight as of this encounter: 107.9 kg.  DVT prophylaxis: Lovenox Code Status: Full code Family Communication: None at bedside Disposition Plan:  Status is: Inpatient    Consultants: None Procedures: None Antimicrobials: None Subjective:   Continues to complain of intractable nausea vomiting multiple times overnight has been on morphine and Dilaudid alternating every 90 minutes due to severe pain encourage her to minimize the use of narcotics if possible to decrease the side effects but she reports her pain is constant and 10 out of 10.  Objective: Vitals:   06/25/21 1328 06/25/21 1333 06/25/21 1944 06/26/21 0445  BP: (!) 132/115 134/88 (!) 143/95 139/74  Pulse: 72  76 64  Resp: '18  18 18  '$ Temp: 98.6 F (37 C)  99.3 F (37.4  C) 98.9 F (37.2 C)  TempSrc: Oral  Oral Oral  SpO2: 98%  96% 97%  Weight:      Height:        Intake/Output Summary (Last 24 hours) at 06/26/2021 1147 Last data filed at 06/25/2021 1500 Gross per 24 hour  Intake 237 ml  Output 450 ml  Net -213 ml    Filed Weights   06/24/21 2225  Weight: 107.9 kg    Examination:  General exam: Appears in distress Respiratory system: Clear to auscultation. Respiratory effort  normal. Cardiovascular system: S1 & S2 heard, RRR. No JVD, murmurs, rubs, gallops or clicks. No pedal edema. Gastrointestinal system: Abdomen is nondistended, soft and nontender. No organomegaly or masses felt. Normal bowel sounds heard.  Left CVA tenderness Central nervous system: Alert and oriented. No focal neurological deficits. Extremities: Symmetric 5 x 5 power. Skin: No rashes, lesions or ulcers Psychiatry: Judgement and insight appear normal. Mood & affect appropriate.     Data Reviewed: I have personally reviewed following labs and imaging studies  CBC: Recent Labs  Lab 06/22/21 0510 06/23/21 0529 06/24/21 1305 06/25/21 0519 06/26/21 0552  WBC 17.6* 21.6* 22.2* 22.6* 20.1*  HGB 10.3* 10.1* 11.3* 9.3* 10.1*  HCT 32.1* 32.3* 33.7* 29.2* 31.1*  MCV 96.7 96.7 95.5 99.3 99.4  PLT 679* 702* 572* 579* 650*    Basic Metabolic Panel: Recent Labs  Lab 06/22/21 0510 06/23/21 0529 06/24/21 1305 06/25/21 0519 06/26/21 0552  NA 142 138 139 140 143  K 3.5 3.6 3.2* 3.1* 4.4  CL 110 106 105 109 111  CO2 '26 25 22 25 26  '$ GLUCOSE 221* 207* 272* 165* 156*  BUN '15 19 11 14 12  '$ CREATININE 0.72 0.81 0.81 0.81 0.77  CALCIUM 8.7* 8.7* 9.0 8.3* 8.3*  MG  --   --  1.3* 1.9  --     GFR: Estimated Creatinine Clearance: 103.5 mL/min (by C-G formula based on SCr of 0.77 mg/dL). Liver Function Tests: Recent Labs  Lab 06/24/21 1305 06/26/21 0552  AST 23 14*  ALT 27 19  ALKPHOS 74 66  BILITOT 0.8 0.6  PROT 7.4 5.9*  ALBUMIN 3.6 2.9*    Recent Labs  Lab 06/24/21 1305  LIPASE 30    No results for input(s): AMMONIA in the last 168 hours. Coagulation Profile: No results for input(s): INR, PROTIME in the last 168 hours. Cardiac Enzymes: No results for input(s): CKTOTAL, CKMB, CKMBINDEX, TROPONINI in the last 168 hours. BNP (last 3 results) No results for input(s): PROBNP in the last 8760 hours. HbA1C: No results for input(s): HGBA1C in the last 72 hours. CBG: Recent Labs   Lab 06/25/21 0734 06/25/21 1142 06/25/21 1731 06/25/21 2137 06/26/21 0741  GLUCAP 124* 130* 121* 102* 149*    Lipid Profile: No results for input(s): CHOL, HDL, LDLCALC, TRIG, CHOLHDL, LDLDIRECT in the last 72 hours. Thyroid Function Tests: No results for input(s): TSH, T4TOTAL, FREET4, T3FREE, THYROIDAB in the last 72 hours. Anemia Panel: No results for input(s): VITAMINB12, FOLATE, FERRITIN, TIBC, IRON, RETICCTPCT in the last 72 hours. Sepsis Labs: Recent Labs  Lab 06/20/21 4970  PROCALCITON <0.10     Recent Results (from the past 240 hour(s))  Urine Culture     Status: Abnormal   Collection Time: 06/16/21  5:18 PM   Specimen: Urine, Clean Catch  Result Value Ref Range Status   Specimen Description   Final    URINE, CLEAN CATCH Performed at [facility], [address], [place],  [place] [address]    Special Requests   Final    NONE Performed at [facility], [address] [place], [place] [address]    Culture 20,000 COLONIES/mL ESCHERICHIA COLI (A)  Final   Report Status 06/19/2021 FINAL  Final   Organism ID, Bacteria ESCHERICHIA COLI (A)  Final      Susceptibility   Escherichia coli - MIC*    AMPICILLIN 4 SENSITIVE Sensitive     CEFAZOLIN <=4 SENSITIVE Sensitive     CEFEPIME <=0.12 SENSITIVE Sensitive     CEFTRIAXONE <=0.25 SENSITIVE Sensitive     CIPROFLOXACIN <=0.25 SENSITIVE Sensitive     GENTAMICIN <=1 SENSITIVE Sensitive     IMIPENEM <=0.25 SENSITIVE Sensitive     NITROFURANTOIN <=16 SENSITIVE Sensitive     TRIMETH/SULFA <=20 SENSITIVE Sensitive     AMPICILLIN/SULBACTAM <=2 SENSITIVE Sensitive     PIP/TAZO <=4 SENSITIVE Sensitive     * 20,000 COLONIES/mL ESCHERICHIA COLI  Culture, blood (Routine X 2) w Reflex to ID Panel     Status: None   Collection Time: 06/16/21 11:45 PM   Specimen: Right Antecubital; Blood  Result Value Ref Range Status   Specimen Description RIGHT ANTECUBITAL  Final   Special Requests   Final    BOTTLES DRAWN AEROBIC AND  ANAEROBIC Blood Culture results may not be optimal due to an excessive volume of blood received in culture bottles   Culture   Final    NO GROWTH 5 DAYS Performed at [facility], [address], [place], [place] [address]    Report Status 06/22/2021 FINAL  Final  Culture, blood (Routine X 2) w Reflex to ID Panel     Status: None   Collection Time: 06/16/21 11:45 PM   Specimen: BLOOD LEFT FOREARM  Result Value Ref Range Status   Specimen Description BLOOD LEFT FOREARM  Final   Special Requests   Final    BOTTLES DRAWN AEROBIC AND ANAEROBIC Blood Culture results may not be optimal due to an excessive volume of blood received in culture bottles   Culture   Final    NO GROWTH 5 DAYS Performed at [facility], [address], [place], [place] [address]    Report Status 06/22/2021 FINAL  Final  Surgical pcr screen     Status: None   Collection Time: 06/22/21 12:06 AM   Specimen: Nasal Mucosa; Nasal Swab  Result Value Ref Range Status   MRSA, PCR NEGATIVE NEGATIVE Final   Staphylococcus aureus NEGATIVE NEGATIVE Final    Comment: (NOTE) The Xpert SA Assay (FDA approved for NASAL specimens in patients 26 years of age and older), is one component of a comprehensive surveillance program. It is not intended to diagnose infection nor to guide or monitor treatment. Performed at [facility], [address], [place], [place] [address]   C Difficile Quick Screen w PCR reflex     Status: None   Collection Time: 06/26/21  4:31 AM   Specimen: Stool  Result Value Ref Range Status   C Diff antigen NEGATIVE NEGATIVE Final   C Diff toxin NEGATIVE NEGATIVE Final   C Diff interpretation No C. difficile detected.  Final    Comment: Performed at [facility] [place] [address] [place], [place] [address]          Radiology Studies: CT ABDOMEN PELVIS W CONTRAST  Result Date: 06/24/2021 CLINICAL DATA:  Diffuse acute abdominal pain EXAM: CT ABDOMEN AND PELVIS WITH CONTRAST TECHNIQUE:  Multidetector CT imaging of the  abdomen and pelvis was performed using the standard protocol following bolus administration of intravenous contrast. RADIATION DOSE REDUCTION: This exam was performed according to the departmental dose-optimization program which includes automated exposure control, adjustment of the mA and/or kV according to patient size and/or use of iterative reconstruction technique. CONTRAST:  146m OMNIPAQUE IOHEXOL 300 MG/ML  SOLN COMPARISON:  Prior CT scan of the abdomen and pelvis 06/16/2021 FINDINGS: Lower chest: 4 mm nonspecific pulmonary nodule in the posterior right lower lobe (image 2 series 4) approximately 7 mm subpleural nodule in the periphery of the right lower lobe also seen on the same image slice. Smaller subpleural nodule/lymph nodes scattered along the pleural surface of the left lower lobe, none measuring larger than 7 mm. The lung bases are otherwise clear. The visualized cardiac structures are within normal limits. Unremarkable distal thoracic esophagus. Hepatobiliary: Low attenuation of the hepatic parenchyma with focal sparing around the gallbladder fossa. No discrete hepatic lesion. No intra or extrahepatic biliary ductal dilatation. Diffuse mild gallbladder wall edema without evidence of distension or cholelithiasis. No pericholecystic inflammatory changes. Pancreas: Unremarkable. No pancreatic ductal dilatation or surrounding inflammatory changes. Spleen: Prior splenectomy. Adrenals/Urinary Tract: Normal adrenal glands. The right kidney is normal in appearance. Approximately 6 mm stone in the lower pole of the left collecting system. Interval removal of ureteral stent. No hydronephrosis. Mild left ureterectasis. The bladder is decompressed. Stomach/Bowel: Normal appearance of the stomach and duodenum. No evidence of obstruction or focal bowel wall thickening. Normal appendix in the right lower quadrant. The terminal ileum is unremarkable. Vascular/Lymphatic: No evidence  of aneurysm. Scattered atherosclerotic plaque. No suspicious lymphadenopathy. Reproductive: Status post hysterectomy. No adnexal masses. Other: Omental fat containing midline ventral abdominal wall hernia, unchanged. New high attenuation nodular opacity in the superficial subcutaneous fat of the right lower quadrant abdominal wall likely reflecting a small contusion given appearance over the 8 days. Musculoskeletal: No acute or significant osseous findings. IMPRESSION: 1. No acute abnormality within the abdomen or pelvis. 2. Interval removal of left-sided double-J ureteral stent. Persistent mild left ureterectasis with inflammatory stranding in the Peri ureteral fat. Findings suggest residual inflammation, or less likely an infectious process (considered very unlikely). 3. Multiple small bilateral pulmonary nodules and subpleural nodules/lymph nodes. Non-contrast chest CT at 3-6 months is recommended. If the nodules are stable at time of repeat CT, then future CT at 18-24 months (from today's scan) is considered optional for low-risk patients, but is recommended for high-risk patients. This recommendation follows the consensus statement: Guidelines for Management of Incidental Pulmonary Nodules Detected on CT Images: From the Fleischner Society 2017; Radiology 2017; 284:228-243. 4.  Aortic Atherosclerosis (ICD10-170.0) 5. Stable moderately large omental fat containing midline ventral hernia. Electronically Signed   By: HJacqulynn CadetM.D.   On: 06/24/2021 13:55        Scheduled Meds:  atorvastatin  40 mg Oral Daily   busPIRone  15 mg Oral BID   enoxaparin (LOVENOX) injection  40 mg Subcutaneous Q24H   escitalopram  20 mg Oral Daily   insulin aspart  0-20 Units Subcutaneous TID WC   insulin detemir  10 Units Subcutaneous QHS   lisinopril  20 mg Oral Daily   tamsulosin  0.4 mg Oral QPC supper   traZODone  150 mg Oral QHS   Continuous Infusions:  0.9 % NaCl with KCl 40 mEq / L 100 mL/hr at 06/26/21  1022     LOS: 1 day    Time spent: 3Linnmin  EGeorgette Shell  MD 06/26/2021, 11:47 AM

## 2021-06-26 NOTE — Progress Notes (Signed)
Initial Nutrition Assessment  DOCUMENTATION CODES:   Morbid obesity  INTERVENTION:   -Ensure MAX Protein po daily, each supplement provides 150 kcal and 30 grams of protein    NUTRITION DIAGNOSIS:   Increased nutrient needs related to wound healing as evidenced by estimated needs.  GOAL:   Patient will meet greater than or equal to 90% of their needs  MONITOR:   PO intake, Supplement acceptance, Labs, Weight trends, Skin, I & O's  REASON FOR ASSESSMENT:   Malnutrition Screening Tool    ASSESSMENT:   46 year old female with history of splenectomy, type 2 diabetes and recurrent kidney stones requiring intervention admitted with intractable left flank pain nausea vomiting and diarrhea.  5/16: s/p ureteral stent placement  Patient currently consuming 25-50% of meals. Pt reporting N/V and diarrhea PTA. Was unable to keep anything down including medications.  Will order Ensure Max supplements given diabetes toe ulcer.   No weight loss per review of weight records.  Medications reviewed.  Labs reviewed:  CBGs: 102-149  NUTRITION - FOCUSED PHYSICAL EXAM:  Unable to complete, RD remote.  Diet Order:   Diet Order             Diet Heart Room service appropriate? Yes; Fluid consistency: Thin  Diet effective now                   EDUCATION NEEDS:   No education needs have been identified at this time  Skin:  Skin Assessment: Skin Integrity Issues: Skin Integrity Issues:: Diabetic Ulcer, Other (Comment) Diabetic Ulcer: right toe Other: Non-pressure wound of abdomen  Last BM:  5/20  Height:   Ht Readings from Last 1 Encounters:  06/24/21 '5\' 3"'$  (1.6 m)    Weight:   Wt Readings from Last 1 Encounters:  06/24/21 107.9 kg    BMI:  Body mass index is 42.14 kg/m.  Estimated Nutritional Needs:   Kcal:  1600-1800  Protein:  75-85g  Fluid:  1.8L/day  Clayton Bibles, MS, RD, LDN Inpatient Clinical Dietitian Contact information available via  Amion

## 2021-06-27 DIAGNOSIS — R109 Unspecified abdominal pain: Secondary | ICD-10-CM | POA: Diagnosis not present

## 2021-06-27 LAB — GLUCOSE, CAPILLARY
Glucose-Capillary: 123 mg/dL — ABNORMAL HIGH (ref 70–99)
Glucose-Capillary: 114 mg/dL — ABNORMAL HIGH (ref 70–99)
Glucose-Capillary: 130 mg/dL — ABNORMAL HIGH (ref 70–99)
Glucose-Capillary: 140 mg/dL — ABNORMAL HIGH (ref 70–99)

## 2021-06-27 MED ORDER — ONDANSETRON HCL 4 MG/2ML IJ SOLN
4.0000 mg | Freq: Once | INTRAMUSCULAR | Status: AC
Start: 2021-06-27 — End: 2021-06-27
  Administered 2021-06-27: 4 mg via INTRAVENOUS
  Filled 2021-06-27: qty 2

## 2021-06-27 MED ORDER — PROCHLORPERAZINE EDISYLATE 10 MG/2ML IJ SOLN
5.0000 mg | Freq: Once | INTRAMUSCULAR | Status: AC
  Administered 2021-06-27: 5 mg via INTRAVENOUS
  Filled 2021-06-27: qty 2

## 2021-06-27 MED ORDER — HYDROMORPHONE HCL 2 MG PO TABS
4.0000 mg | ORAL_TABLET | ORAL | Status: DC | PRN
  Administered 2021-06-27 – 2021-06-28 (×5): 4 mg via ORAL
  Filled 2021-06-27 (×6): qty 2

## 2021-06-27 NOTE — Progress Notes (Signed)
PROGRESS NOTE    Jamie Conley  FKC:[id number] DOB: 10-07-1975 DOA: 06/24/2021 PCP: Gildardo Pounds, NP   Brief Narrative: 46 year old female with history of splenectomy, type 2 diabetes and recurrent kidney stones requiring intervention admitted with intractable left flank pain nausea vomiting and diarrhea.  Patient was discharged from the hospital on 06/23/2021 after a 1 week stay for acute pyelonephritis and had left ureteral stent placed which fell off in the hospital after she was discharged.  ED physician discussed with urologist on-call and was recommended to continue pain management and no intervention was planned at this time.  CT abdomen and pelvis shows a 6 mm stone in the left pole of the left collecting system persistent mild left ureter ureterectasis with inflammatory stranding likely suggestive of residual inflammation rather than infectious process.  She was also found to be hypokalemic with a K of 3.2 mag of 1.3 creatinine was 0.81 had leukocytosis of 22,000.  She had 15 episodes of diarrhea prior to admission.  She was unable to keep anything down at home including her pain medications which she had Dilaudid and Percocet.  Assessment & Plan:   Principal Problem:   Flank pain Active Problems:   Type 2 diabetes mellitus with hyperlipidemia (HCC)   Class 3 obesity (HCC)   Depression   Leukocytosis   Hypokalemia   Hypomagnesemia   Thrombocytosis   Pulmonary nodule   Intractable nausea and vomiting   #1 intractable nausea vomiting and left flank pain- patient admitted with above symptoms with recent placement of ureteral stent on 06/22/2021.  However the stent came out on 06/23/2021.   CT abdomen on this admission shows no obstruction to the ureteral collecting system, no hydronephrosis.   Urology recommends pain management symptomatic management and IV fluids. Patient has been using Dilaudid overnight IV and long-acting morphine was started p.o. yesterday.  I have  encouraged her to get out of bed today ambulate and advance diet as tolerated and hopefully discharge her home tomorrow. DC IV Dilaudid and start p.o. Dilaudid  #2  Severe hypokalemia repleted   #3 hypomagnesemia repleted mag  #4 type 2 diabetes and hyperlipidemia on Levemir 10 units nightly with resistant SSI CBG (last 3)  Recent Labs    06/26/21 2142 06/27/21 0816 06/27/21 1136  GLUCAP 123* 123* 114*     #5 morbid obesity with a BMI of 42 complicates overall clinical picture and outcome  #6 right lower lobe lung nodule follow-up CT as an outpatient in 3 to 6 months   #7 depression on Lexapro trazodone and BuSpar  Estimated body mass index is 42.14 kg/m as calculated from the following:   Height as of this encounter: '5\' 3"'$  (1.6 m).   Weight as of this encounter: 107.9 kg.  DVT prophylaxis: Lovenox Code Status: Full code Family Communication: None at bedside Disposition Plan:  Status is: Inpatient    Consultants: None Procedures: None Antimicrobials: None Subjective:   She feels better than yesterday however has been using Dilaudid overnight encouraged her to get out of bed today ambulate  Objective: Vitals:   06/26/21 0445 06/26/21 1515 06/26/21 2029 06/27/21 0440  BP: 139/74 124/89 131/83 (!) 108/56  Pulse: 64 77 72 63  Resp: '18 18 16 16  '$ Temp: 98.9 F (37.2 C) 99 F (37.2 C) 99.4 F (37.4 C) 98.1 F (36.7 C)  TempSrc: Oral Oral Oral Oral  SpO2: 97% 97% 97% 98%  Weight:      Height:  Intake/Output Summary (Last 24 hours) at 06/27/2021 1147 Last data filed at 06/27/2021 0900 Gross per 24 hour  Intake 1490.86 ml  Output 950 ml  Net 540.86 ml    Filed Weights   06/24/21 2225  Weight: 107.9 kg    Examination:  General exam: Appears in distress Respiratory system: Clear to auscultation. Respiratory effort normal. Cardiovascular system: S1 & S2 heard, RRR. No JVD, murmurs, rubs, gallops or clicks. No pedal edema. Gastrointestinal system:  Abdomen is nondistended, soft and nontender. No organomegaly or masses felt. Normal bowel sounds heard.  Left CVA tenderness Central nervous system: Alert and oriented. No focal neurological deficits. Extremities: Symmetric 5 x 5 power. Skin: No rashes, lesions or ulcers Psychiatry: Judgement and insight appear normal. Mood & affect appropriate.     Data Reviewed: I have personally reviewed following labs and imaging studies  CBC: Recent Labs  Lab 06/22/21 0510 06/23/21 0529 06/24/21 1305 06/25/21 0519 06/26/21 0552  WBC 17.6* 21.6* 22.2* 22.6* 20.1*  HGB 10.3* 10.1* 11.3* 9.3* 10.1*  HCT 32.1* 32.3* 33.7* 29.2* 31.1*  MCV 96.7 96.7 95.5 99.3 99.4  PLT 679* 702* 572* 579* 650*    Basic Metabolic Panel: Recent Labs  Lab 06/22/21 0510 06/23/21 0529 06/24/21 1305 06/25/21 0519 06/26/21 0552  NA 142 138 139 140 143  K 3.5 3.6 3.2* 3.1* 4.4  CL 110 106 105 109 111  CO2 '26 25 22 25 26  '$ GLUCOSE 221* 207* 272* 165* 156*  BUN '15 19 11 14 12  '$ CREATININE 0.72 0.81 0.81 0.81 0.77  CALCIUM 8.7* 8.7* 9.0 8.3* 8.3*  MG  --   --  1.3* 1.9  --     GFR: Estimated Creatinine Clearance: 103.5 mL/min (by C-G formula based on SCr of 0.77 mg/dL). Liver Function Tests: Recent Labs  Lab 06/24/21 1305 06/26/21 0552  AST 23 14*  ALT 27 19  ALKPHOS 74 66  BILITOT 0.8 0.6  PROT 7.4 5.9*  ALBUMIN 3.6 2.9*    Recent Labs  Lab 06/24/21 1305  LIPASE 30    No results for input(s): AMMONIA in the last 168 hours. Coagulation Profile: No results for input(s): INR, PROTIME in the last 168 hours. Cardiac Enzymes: No results for input(s): CKTOTAL, CKMB, CKMBINDEX, TROPONINI in the last 168 hours. BNP (last 3 results) No results for input(s): PROBNP in the last 8760 hours. HbA1C: No results for input(s): HGBA1C in the last 72 hours. CBG: Recent Labs  Lab 06/26/21 1156 06/26/21 1627 06/26/21 2142 06/27/21 0816 06/27/21 1136  GLUCAP 143* 131* 123* 123* 114*    Lipid  Profile: No results for input(s): CHOL, HDL, LDLCALC, TRIG, CHOLHDL, LDLDIRECT in the last 72 hours. Thyroid Function Tests: No results for input(s): TSH, T4TOTAL, FREET4, T3FREE, THYROIDAB in the last 72 hours. Anemia Panel: No results for input(s): VITAMINB12, FOLATE, FERRITIN, TIBC, IRON, RETICCTPCT in the last 72 hours. Sepsis Labs: No results for input(s): PROCALCITON, LATICACIDVEN in the last 168 hours.   Recent Results (from the past 240 hour(s))  Surgical pcr screen     Status: None   Collection Time: 06/22/21 12:06 AM   Specimen: Nasal Mucosa; Nasal Swab  Result Value Ref Range Status   MRSA, PCR NEGATIVE NEGATIVE Final   Staphylococcus aureus NEGATIVE NEGATIVE Final    Comment: (NOTE) The Xpert SA Assay (FDA approved for NASAL specimens in patients 37 years of age and older), is one component of a comprehensive surveillance program. It is not intended to diagnose infection nor to  guide or monitor treatment. Performed at [facility], [address] [place], [place] [address]   Gastrointestinal Panel by PCR , Stool     Status: None   Collection Time: 06/26/21  4:31 AM   Specimen: Stool  Result Value Ref Range Status   Campylobacter species NOT DETECTED NOT DETECTED Final   Plesimonas shigelloides NOT DETECTED NOT DETECTED Final   Salmonella species NOT DETECTED NOT DETECTED Final   Yersinia enterocolitica NOT DETECTED NOT DETECTED Final   Vibrio species NOT DETECTED NOT DETECTED Final   Vibrio cholerae NOT DETECTED NOT DETECTED Final   Enteroaggregative E coli (EAEC) NOT DETECTED NOT DETECTED Final   Enteropathogenic E coli (EPEC) NOT DETECTED NOT DETECTED Final   Enterotoxigenic E coli (ETEC) NOT DETECTED NOT DETECTED Final   Shiga like toxin producing E coli (STEC) NOT DETECTED NOT DETECTED Final   Shigella/Enteroinvasive E coli (EIEC) NOT DETECTED NOT DETECTED Final   Cryptosporidium NOT DETECTED NOT DETECTED Final   Cyclospora cayetanensis NOT DETECTED NOT  DETECTED Final   Entamoeba histolytica NOT DETECTED NOT DETECTED Final   Giardia lamblia NOT DETECTED NOT DETECTED Final   Adenovirus F40/41 NOT DETECTED NOT DETECTED Final   Astrovirus NOT DETECTED NOT DETECTED Final   Norovirus GI/GII NOT DETECTED NOT DETECTED Final   Rotavirus A NOT DETECTED NOT DETECTED Final   Sapovirus (I, II, IV, and V) NOT DETECTED NOT DETECTED Final    Comment: Performed at [facility], [address] [place], [place] [address]  C Difficile Quick Screen w PCR reflex     Status: None   Collection Time: 06/26/21  4:31 AM   Specimen: Stool  Result Value Ref Range Status   C Diff antigen NEGATIVE NEGATIVE Final   C Diff toxin NEGATIVE NEGATIVE Final   C Diff interpretation No C. difficile detected.  Final    Comment: Performed at [facility], [place] [address], [place], [place] [address]          Radiology Studies: No results found.      Scheduled Meds:  atorvastatin  40 mg Oral Daily   busPIRone  15 mg Oral BID   enoxaparin (LOVENOX) injection  40 mg Subcutaneous Q24H   escitalopram  20 mg Oral Daily   insulin aspart  0-20 Units Subcutaneous TID WC   insulin detemir  10 Units Subcutaneous QHS   lisinopril  20 mg Oral Daily   oxyCODONE  15 mg Oral Q12H   Ensure Max Protein  11 oz Oral Daily   tamsulosin  0.4 mg Oral QPC supper   traZODone  150 mg Oral QHS   Continuous Infusions:  sodium chloride 75 mL/hr at 06/27/21 0258     LOS: 2 days    Time spent: 25 min  Georgette Shell, MD 06/27/2021, 11:47 AM

## 2021-06-27 NOTE — Telephone Encounter (Signed)
Noted.  Still hospitalized

## 2021-06-28 ENCOUNTER — Other Ambulatory Visit: Payer: Self-pay

## 2021-06-28 DIAGNOSIS — R109 Unspecified abdominal pain: Secondary | ICD-10-CM | POA: Diagnosis not present

## 2021-06-28 LAB — CALCULI, WITH PHOTOGRAPH (CLINICAL LAB)
Calcium Oxalate Dihydrate: 20 %
Calcium Oxalate Monohydrate: 80 %
Weight Calculi: 69 mg

## 2021-06-28 LAB — CBC
HCT: 29.9 % — ABNORMAL LOW (ref 36.0–46.0)
Hemoglobin: 9.6 g/dL — ABNORMAL LOW (ref 12.0–15.0)
MCH: 32.2 pg (ref 26.0–34.0)
MCHC: 32.1 g/dL (ref 30.0–36.0)
MCV: 100.3 fL — ABNORMAL HIGH (ref 80.0–100.0)
Platelets: 618 10*3/uL — ABNORMAL HIGH (ref 150–400)
RBC: 2.98 MIL/uL — ABNORMAL LOW (ref 3.87–5.11)
RDW: 17.9 % — ABNORMAL HIGH (ref 11.5–15.5)
WBC: 19.8 10*3/uL — ABNORMAL HIGH (ref 4.0–10.5)
nRBC: 0.4 % — ABNORMAL HIGH (ref 0.0–0.2)

## 2021-06-28 LAB — COMPREHENSIVE METABOLIC PANEL
ALT: 17 U/L (ref 0–44)
AST: 13 U/L — ABNORMAL LOW (ref 15–41)
Albumin: 2.9 g/dL — ABNORMAL LOW (ref 3.5–5.0)
Alkaline Phosphatase: 66 U/L (ref 38–126)
Anion gap: 6 (ref 5–15)
BUN: 5 mg/dL — ABNORMAL LOW (ref 6–20)
CO2: 27 mmol/L (ref 22–32)
Calcium: 8.7 mg/dL — ABNORMAL LOW (ref 8.9–10.3)
Chloride: 112 mmol/L — ABNORMAL HIGH (ref 98–111)
Creatinine, Ser: 0.82 mg/dL (ref 0.44–1.00)
GFR, Estimated: >60 mL/min (ref >60)
Glucose, Bld: 123 mg/dL — ABNORMAL HIGH (ref 70–99)
Potassium: 3.7 mmol/L (ref 3.5–5.1)
Sodium: 145 mmol/L (ref 135–145)
Total Bilirubin: 0.9 mg/dL (ref 0.3–1.2)
Total Protein: 5.9 g/dL — ABNORMAL LOW (ref 6.5–8.1)

## 2021-06-28 LAB — MAGNESIUM: Magnesium: 1.8 mg/dL (ref 1.7–2.4)

## 2021-06-28 LAB — GLUCOSE, CAPILLARY
Glucose-Capillary: 130 mg/dL — ABNORMAL HIGH (ref 70–99)
Glucose-Capillary: 135 mg/dL — ABNORMAL HIGH (ref 70–99)

## 2021-06-28 MED ORDER — OXYCODONE-ACETAMINOPHEN 10-325 MG PO TABS
1.0000 | ORAL_TABLET | Freq: Four times a day (QID) | ORAL | 0 refills | Status: AC | PRN
  Filled 2021-06-28: qty 15, 4d supply, fill #0

## 2021-06-28 NOTE — Progress Notes (Signed)
Pt had N/V, prn zofran given without relief. IV compazine given with positive results.  Louanne Skye 06/28/21 3:33 AM

## 2021-06-28 NOTE — Progress Notes (Signed)
Discharge instructions provided to patient. All medications, follow up appointments, and discharge instructions reviewed. IV out. Monitor off CCMD notified. Discharging to home.  Era Bumpers, RN

## 2021-06-29 ENCOUNTER — Telehealth: Payer: Self-pay

## 2021-06-29 NOTE — Discharge Summary (Signed)
Physician Discharge Summary  Jamie Conley LFY:[id number] DOB: [birth year] DOA: 06/24/2021  PCP: Gildardo Pounds, NP  Admit date: 06/24/2021 Discharge date: 06/29/2021  Admitted From: home Disposition:  home  Recommendations for Outpatient Follow-up:  Follow up with PCP in 1-2 weeks Please obtain BMP/CBC in one week Please follow up with urology  Home Health: Equipment/Devices:none Discharge Condition:stable CODE STATUS:full Diet recommendation: cardiac Brief/Interim Summary: 46 year old female with history of splenectomy, type 2 diabetes and recurrent kidney stones requiring intervention admitted with intractable left flank pain nausea vomiting and diarrhea.  Patient was discharged from the hospital on 06/23/2021 after a 1 week stay for acute pyelonephritis and had left ureteral stent placed which fell off in the hospital after she was discharged.  ED physician discussed with urologist on-call and was recommended to continue pain management and no intervention was planned at this time.  CT abdomen and pelvis shows a 6 mm stone in the left pole of the left collecting system persistent mild left ureter ureterectasis with inflammatory stranding likely suggestive of residual inflammation rather than infectious process.  She was also found to be hypokalemic with a K of 3.2 mag of 1.3 creatinine was 0.81 had leukocytosis of 22,000.  She had 15 episodes of diarrhea prior to admission.  She was unable to keep anything down at home including her pain medications which she had Dilaudid and Percocet. Discharge Diagnoses:  Principal Problem:   Flank pain Active Problems:   Type 2 diabetes mellitus with hyperlipidemia (HCC)   Class 3 obesity (HCC)   Depression   Leukocytosis   Hypokalemia   Hypomagnesemia   Thrombocytosis   Pulmonary nodule   Intractable nausea and vomiting    #1 intractable nausea vomiting and left flank pain- patient admitted with above symptoms with recent placement  of ureteral stent on 06/22/2021.  However the stent came out on 06/23/2021.   CT abdomen on this admission shows no obstruction to the ureteral collecting system, no hydronephrosis.   Urology recommended pain management symptomatic management and IV fluids.  #2  Severe hypokalemia repleted    #3 hypomagnesemia repleted    #4 type 2 diabetes -continue home meds    #5 morbid obesity with a BMI of 42 complicates overall clinical picture and outcome   #6 right lower lobe lung nodule follow-up CT as an outpatient in 3 to 6 months  #7 depression on Lexapro trazodone and BuSpar    Nutrition Problem: Increased nutrient needs Etiology: wound healing    Signs/Symptoms: estimated needs     Interventions: Premier Protein  Estimated body mass index is 42.14 kg/m as calculated from the following:   Height as of this encounter: '5\' 3"'  (1.6 m).   Weight as of this encounter: 107.9 kg.  Discharge Instructions  Discharge Instructions     Diet - low sodium heart healthy   Complete by: As directed    Increase activity slowly   Complete by: As directed    No wound care   Complete by: As directed       Allergies as of 06/28/2021   No Known Allergies      Medication List     STOP taking these medications    cefUROXime 500 MG tablet Commonly known as: CEFTIN   diphenhydrAMINE 25 MG tablet Commonly known as: BENADRYL   NovoLOG FlexPen 100 UNIT/ML FlexPen Generic drug: insulin aspart       TAKE these medications    atorvastatin 40 MG tablet Commonly known as: LIPITOR  Take 1 tablet (40 mg total) by mouth daily.   blood glucose meter kit and supplies Kit Dispense based on patient and insurance preference. Use up to four times daily as directed.   busPIRone 15 MG tablet Commonly known as: BUSPAR Take 1 tablet (15 mg total) by mouth 2 (two) times daily.   escitalopram 20 MG tablet Commonly known as: LEXAPRO Take 1 tablet (20 mg total) by mouth daily.   gabapentin  300 MG capsule Commonly known as: NEURONTIN Take 1 capsule (300 mg total) by mouth 3 (three) times daily.   HYDROmorphone 4 MG tablet Commonly known as: Dilaudid Take 1 tablet (4 mg total) by mouth every 4 (four) hours as needed for severe pain.   insulin aspart protamine- aspart (70-30) 100 UNIT/ML injection Commonly known as: NOVOLOG MIX 70/30 Inject 20 Units into the skin 2 (two) times daily with a meal.   Insulin Pen Needle 31G X 5 MM Misc 100 each by Does not apply route 2 (two) times daily.   Levemir FlexPen 100 UNIT/ML FlexPen Generic drug: insulin detemir inject 20 units into the skin at bedtime What changed:  how much to take when to take this   lisinopril 20 MG tablet Commonly known as: ZESTRIL Take 1 tablet (20 mg total) by mouth daily.   metFORMIN 1000 MG tablet Commonly known as: GLUCOPHAGE Take 1 tablet (1,000 mg total) by mouth 2 (two) times daily with a meal.   ondansetron 4 MG tablet Commonly known as: Zofran Take 1 tablet (4 mg total) by mouth daily as needed for nausea or vomiting.   oxyCODONE-acetaminophen 10-325 MG tablet Commonly known as: Percocet Take 1 tablet by mouth every 6 (six) hours as needed for pain.   sodium chloride 0.65 % nasal spray Commonly known as: OCEAN Place 1 spray into the nose daily.   tamsulosin 0.4 MG Caps capsule Commonly known as: Flomax Take 1 capsule (0.4 mg total) by mouth daily after supper.   traZODone 150 MG tablet Commonly known as: DESYREL Take 1 tablet (150 mg total) by mouth at bedtime as needed for sleep. What changed: when to take this   Trulicity 5.62 BW/3.8LH Sopn Generic drug: Dulaglutide Inject 0.75 mg into the skin once a week. What changed: additional instructions        Follow-up Information     Gildardo Pounds, NP Follow up.   Specialty: Nurse Practitioner Contact information: [place] [address] [phone]                No Known  Allergies  Consultations: none   Procedures/Studies: CT ABDOMEN PELVIS W CONTRAST  Result Date: 06/24/2021 CLINICAL DATA:  Diffuse acute abdominal pain EXAM: CT ABDOMEN AND PELVIS WITH CONTRAST TECHNIQUE: Multidetector CT imaging of the abdomen and pelvis was performed using the standard protocol following bolus administration of intravenous contrast. RADIATION DOSE REDUCTION: This exam was performed according to the departmental dose-optimization program which includes automated exposure control, adjustment of the mA and/or kV according to patient size and/or use of iterative reconstruction technique. CONTRAST:  179m OMNIPAQUE IOHEXOL 300 MG/ML  SOLN COMPARISON:  Prior CT scan of the abdomen and pelvis 06/16/2021 FINDINGS: Lower chest: 4 mm nonspecific pulmonary nodule in the posterior right lower lobe (image 2 series 4) approximately 7 mm subpleural nodule in the periphery of the right lower lobe also seen on the same image slice. Smaller subpleural nodule/lymph nodes scattered along the pleural surface of the left lower lobe, none measuring  larger than 7 mm. The lung bases are otherwise clear. The visualized cardiac structures are within normal limits. Unremarkable distal thoracic esophagus. Hepatobiliary: Low attenuation of the hepatic parenchyma with focal sparing around the gallbladder fossa. No discrete hepatic lesion. No intra or extrahepatic biliary ductal dilatation. Diffuse mild gallbladder wall edema without evidence of distension or cholelithiasis. No pericholecystic inflammatory changes. Pancreas: Unremarkable. No pancreatic ductal dilatation or surrounding inflammatory changes. Spleen: Prior splenectomy. Adrenals/Urinary Tract: Normal adrenal glands. The right kidney is normal in appearance. Approximately 6 mm stone in the lower pole of the left collecting system. Interval removal of ureteral stent. No hydronephrosis. Mild left ureterectasis. The bladder is decompressed. Stomach/Bowel:  Normal appearance of the stomach and duodenum. No evidence of obstruction or focal bowel wall thickening. Normal appendix in the right lower quadrant. The terminal ileum is unremarkable. Vascular/Lymphatic: No evidence of aneurysm. Scattered atherosclerotic plaque. No suspicious lymphadenopathy. Reproductive: Status post hysterectomy. No adnexal masses. Other: Omental fat containing midline ventral abdominal wall hernia, unchanged. New high attenuation nodular opacity in the superficial subcutaneous fat of the right lower quadrant abdominal wall likely reflecting a small contusion given appearance over the 8 days. Musculoskeletal: No acute or significant osseous findings. IMPRESSION: 1. No acute abnormality within the abdomen or pelvis. 2. Interval removal of left-sided double-J ureteral stent. Persistent mild left ureterectasis with inflammatory stranding in the Peri ureteral fat. Findings suggest residual inflammation, or less likely an infectious process (considered very unlikely). 3. Multiple small bilateral pulmonary nodules and subpleural nodules/lymph nodes. Non-contrast chest CT at 3-6 months is recommended. If the nodules are stable at time of repeat CT, then future CT at 18-24 months (from today's scan) is considered optional for low-risk patients, but is recommended for high-risk patients. This recommendation follows the consensus statement: Guidelines for Management of Incidental Pulmonary Nodules Detected on CT Images: From the Fleischner Society 2017; Radiology 2017; 284:228-243. 4.  Aortic Atherosclerosis (ICD10-170.0) 5. Stable moderately large omental fat containing midline ventral hernia. Electronically Signed   By: Jacqulynn Cadet M.D.   On: 06/24/2021 13:55   DG C-Arm 1-60 Min-No Report  Result Date: 06/22/2021 Fluoroscopy was utilized by the requesting physician.  No radiographic interpretation.   DG C-Arm 1-60 Min-No Report  Result Date: 06/09/2021 Fluoroscopy was utilized by the  requesting physician.  No radiographic interpretation.   CT Renal Stone Study  Result Date: 06/16/2021 CLINICAL DATA:  Flank pain, kidney stone suspected Left flank pain.  Stent placement. EXAM: CT ABDOMEN AND PELVIS WITHOUT CONTRAST TECHNIQUE: Multidetector CT imaging of the abdomen and pelvis was performed following the standard protocol without IV contrast. RADIATION DOSE REDUCTION: This exam was performed according to the departmental dose-optimization program which includes automated exposure control, adjustment of the mA and/or kV according to patient size and/or use of iterative reconstruction technique. COMPARISON:  CT 06/03/2021 FINDINGS: Lower chest: No acute findings. Hepatobiliary: Diffusely decreased hepatic density consistent with steatosis. There is focal fatty sparing adjacent to the gallbladder fossa. The liver is enlarged spanning 21.8 cm cranial caudal. No discrete liver lesion. Gallbladder physiologically distended, no calcified stone. No biliary dilatation. Pancreas: No ductal dilatation or inflammation. Spleen: Splenectomy.  Minimal splenosis in the left upper quadrant. Adrenals/Urinary Tract: Normal adrenal glands. Left nephroureteral stent in place, proximal pigtail in the renal pelvis, distal pigtail in the bladder. The renal collecting system is decompressed. There is mild left perinephric edema. 7 mm nonobstructing stone in the lower left kidney. Punctate nonobstructing left upper pole stone there are no stone or  stone fragments along the course of the stent. There is no right hydronephrosis or renal calculus. The urinary bladder is completely empty. No bladder stone. Stomach/Bowel: Decompressed stomach. There is no small bowel obstruction or inflammation. Normal appendix. Moderate volume of colonic stool. No colonic wall thickening or pericolonic edema. Vascular/Lymphatic: Mild aortic atherosclerosis. No aortic aneurysm. No abdominopelvic adenopathy. Reproductive: Hysterectomy.  No  adnexal mass. Other: No ascites or free air. No abdominopelvic collection. There is a moderate-sized fat containing umbilical hernia mild edema diffusely in the subcutaneous fat. Musculoskeletal: Mild L5-S1 disc space narrowing. There are no acute or suspicious osseous abnormalities. IMPRESSION: 1. Left nephroureteral stent in place with decompression of the renal collecting system. No stone along the course of the stent. Nonobstructing left nephrolithiasis. 2. Minimal left perinephric edema, recommend correlation with urinalysis to assess for urinary tract infection. 3. Hepatic steatosis and hepatomegaly. 4. Moderate-sized fat containing umbilical hernia. Aortic Atherosclerosis (ICD10-I70.0). Electronically Signed   By: Keith Rake M.D.   On: 06/16/2021 20:33   CT Renal Stone Study  Result Date: 06/03/2021 CLINICAL DATA:  Bilateral flank pain EXAM: CT ABDOMEN AND PELVIS WITHOUT CONTRAST TECHNIQUE: Multidetector CT imaging of the abdomen and pelvis was performed following the standard protocol without IV contrast. RADIATION DOSE REDUCTION: This exam was performed according to the departmental dose-optimization program which includes automated exposure control, adjustment of the mA and/or kV according to patient size and/or use of iterative reconstruction technique. COMPARISON:  CT abdomen and pelvis dated May 24, 2021 FINDINGS: Lower chest: No acute abnormality. Hepatobiliary: Hepatic steatosis. Gallbladder is unremarkable. No biliary ductal dilation. Pancreas: Unremarkable. No pancreatic ductal dilatation or surrounding inflammatory changes. Spleen: Prior splenectomy. Adrenals/Urinary Tract: Bilateral adrenal glands are unremarkable. Nonobstructing stone of the lower pole of the left kidney. No hydronephrosis. Bladder is unremarkable. Stomach/Bowel: Stomach is within normal limits. Appendix appears normal. No evidence of bowel wall thickening, distention, or inflammatory changes. Vascular/Lymphatic:  Aortic atherosclerosis. No enlarged abdominal or pelvic lymph nodes. Reproductive: No adnexal masses. Other: Small fat containing umbilical hernia. No abdominopelvic ascites. Musculoskeletal: No acute or significant osseous findings. IMPRESSION: 1. No acute findings in the abdomen or pelvis, including no evidence of obstructive uropathy. 2. Nonobstructing stone of the lower pole the left kidney. 3. Hepatic steatosis. 4.  Aortic Atherosclerosis (ICD10-I70.0). Electronically Signed   By: Yetta Glassman M.D.   On: 06/03/2021 20:35   (Echo, Carotid, EGD, Colonoscopy, ERCP)    Subjective: Anxious to go home no new complaints tolerating p.o. intake  Discharge Exam: Vitals:   06/27/21 2040 06/28/21 0611  BP: 123/87 (!) 140/94  Pulse: 77 80  Resp: 18 18  Temp: 98.9 F (37.2 C) 99.1 F (37.3 C)  SpO2: 98% 97%   Vitals:   06/27/21 0440 06/27/21 1412 06/27/21 2040 06/28/21 0611  BP: (!) 108/56 130/68 123/87 (!) 140/94  Pulse: 63 73 77 80  Resp: '16 15 18 18  ' Temp: 98.1 F (36.7 C) 98.1 F (36.7 C) 98.9 F (37.2 C) 99.1 F (37.3 C)  TempSrc: Oral Oral Oral Oral  SpO2: 98% 98% 98% 97%  Weight:      Height:        General: Pt is alert, awake, not in acute distress Cardiovascular: RRR, S1/S2 +, no rubs, no gallops Respiratory: CTA bilaterally, no wheezing, no rhonchi Abdominal: Soft, NT, ND, bowel sounds + Extremities: no edema, no cyanosis    The results of significant diagnostics from this hospitalization (including imaging, microbiology, ancillary and laboratory) are listed below for  reference.     Microbiology: Recent Results (from the past 240 hour(s))  Surgical pcr screen     Status: None   Collection Time: 06/22/21 12:06 AM   Specimen: Nasal Mucosa; Nasal Swab  Result Value Ref Range Status   MRSA, PCR NEGATIVE NEGATIVE Final   Staphylococcus aureus NEGATIVE NEGATIVE Final    Comment: (NOTE) The Xpert SA Assay (FDA approved for NASAL specimens in patients 62 years of  age and older), is one component of a comprehensive surveillance program. It is not intended to diagnose infection nor to guide or monitor treatment. Performed at [facility], [address], [place],  [place] [address]   Gastrointestinal Panel by PCR , Stool     Status: None   Collection Time: 06/26/21  4:31 AM   Specimen: Stool  Result Value Ref Range Status   Campylobacter species NOT DETECTED NOT DETECTED Final   Plesimonas shigelloides NOT DETECTED NOT DETECTED Final   Salmonella species NOT DETECTED NOT DETECTED Final   Yersinia enterocolitica NOT DETECTED NOT DETECTED Final   Vibrio species NOT DETECTED NOT DETECTED Final   Vibrio cholerae NOT DETECTED NOT DETECTED Final   Enteroaggregative E coli (EAEC) NOT DETECTED NOT DETECTED Final   Enteropathogenic E coli (EPEC) NOT DETECTED NOT DETECTED Final   Enterotoxigenic E coli (ETEC) NOT DETECTED NOT DETECTED Final   Shiga like toxin producing E coli (STEC) NOT DETECTED NOT DETECTED Final   Shigella/Enteroinvasive E coli (EIEC) NOT DETECTED NOT DETECTED Final   Cryptosporidium NOT DETECTED NOT DETECTED Final   Cyclospora cayetanensis NOT DETECTED NOT DETECTED Final   Entamoeba histolytica NOT DETECTED NOT DETECTED Final   Giardia lamblia NOT DETECTED NOT DETECTED Final   Adenovirus F40/41 NOT DETECTED NOT DETECTED Final   Astrovirus NOT DETECTED NOT DETECTED Final   Norovirus GI/GII NOT DETECTED NOT DETECTED Final   Rotavirus A NOT DETECTED NOT DETECTED Final   Sapovirus (I, II, IV, and V) NOT DETECTED NOT DETECTED Final    Comment: Performed at [facility], [address] [place], [place] [address]  C Difficile Quick Screen w PCR reflex     Status: None   Collection Time: 06/26/21  4:31 AM   Specimen: Stool  Result Value Ref Range Status   C Diff antigen NEGATIVE NEGATIVE Final   C Diff toxin NEGATIVE NEGATIVE Final   C Diff interpretation No C. difficile detected.  Final    Comment: Performed at [facility], [place] [address], [place], [place] [address]     Labs: BNP (last 3 results) No results for input(s): BNP in the last 8760 hours. Basic Metabolic Panel: Recent Labs  Lab 06/23/21 0529 06/24/21 1305 06/25/21 0519 06/26/21 0552 06/28/21 0425  NA 138 139 140 143 145  K 3.6 3.2* 3.1* 4.4 3.7  CL 106 105 109 111 112*  CO2 '25 22 25 26 27  ' GLUCOSE 207* 272* 165* 156* 123*  BUN '19 11 14 12 ' 5*  CREATININE 0.81 0.81 0.81 0.77 0.82  CALCIUM 8.7* 9.0 8.3* 8.3* 8.7*  MG  --  1.3* 1.9  --  1.8   Liver Function Tests: Recent Labs  Lab 06/24/21 1305 06/26/21 0552 06/28/21 0425  AST 23 14* 13*  ALT '27 19 17  ' ALKPHOS 74 66 66  BILITOT 0.8 0.6 0.9  PROT 7.4 5.9* 5.9*  ALBUMIN 3.6 2.9* 2.9*   Recent Labs  Lab 06/24/21 1305  LIPASE 30   No results for input(s): AMMONIA in the  last 168 hours. CBC: Recent Labs  Lab 06/23/21 0529 06/24/21 1305 06/25/21 0519 06/26/21 0552 06/28/21 0425  WBC 21.6* 22.2* 22.6* 20.1* 19.8*  HGB 10.1* 11.3* 9.3* 10.1* 9.6*  HCT 32.3* 33.7* 29.2* 31.1* 29.9*  MCV 96.7 95.5 99.3 99.4 100.3*  PLT 702* 572* 579* 650* 618*   Cardiac Enzymes: No results for input(s): CKTOTAL, CKMB, CKMBINDEX, TROPONINI in the last 168 hours. BNP: Invalid input(s): POCBNP CBG: Recent Labs  Lab 06/27/21 1136 06/27/21 1749 06/27/21 2038 06/28/21 0838 06/28/21 1211  GLUCAP 114* 130* 140* 130* 135*   D-Dimer No results for input(s): DDIMER in the last 72 hours. Hgb A1c No results for input(s): HGBA1C in the last 72 hours. Lipid Profile No results for input(s): CHOL, HDL, LDLCALC, TRIG, CHOLHDL, LDLDIRECT in the last 72 hours. Thyroid function studies No results for input(s): TSH, T4TOTAL, T3FREE, THYROIDAB in the last 72 hours.  Invalid input(s): FREET3 Anemia work up No results for input(s): VITAMINB12, FOLATE, FERRITIN, TIBC, IRON, RETICCTPCT in the last 72 hours. Urinalysis    Component Value Date/Time   COLORURINE STRAW (A)  06/24/2021 1255   APPEARANCEUR CLEAR 06/24/2021 1255   APPEARANCEUR Clear 05/31/2021 0800   LABSPEC 1.008 06/24/2021 1255   PHURINE 9.0 (H) 06/24/2021 1255   GLUCOSEU 150 (A) 06/24/2021 1255   HGBUR SMALL (A) 06/24/2021 1255   BILIRUBINUR NEGATIVE 06/24/2021 1255   BILIRUBINUR Negative 05/31/2021 0800   KETONESUR 20 (A) 06/24/2021 1255   PROTEINUR NEGATIVE 06/24/2021 1255   NITRITE NEGATIVE 06/24/2021 1255   LEUKOCYTESUR NEGATIVE 06/24/2021 1255   Sepsis Labs Invalid input(s): PROCALCITONIN,  WBC,  LACTICIDVEN Microbiology Recent Results (from the past 240 hour(s))  Surgical pcr screen     Status: None   Collection Time: 06/22/21 12:06 AM   Specimen: Nasal Mucosa; Nasal Swab  Result Value Ref Range Status   MRSA, PCR NEGATIVE NEGATIVE Final   Staphylococcus aureus NEGATIVE NEGATIVE Final    Comment: (NOTE) The Xpert SA Assay (FDA approved for NASAL specimens in patients 77 years of age and older), is one component of a comprehensive surveillance program. It is not intended to diagnose infection nor to guide or monitor treatment. Performed at [facility], [address] [place], [place] [address]   Gastrointestinal Panel by PCR , Stool     Status: None   Collection Time: 06/26/21  4:31 AM   Specimen: Stool  Result Value Ref Range Status   Campylobacter species NOT DETECTED NOT DETECTED Final   Plesimonas shigelloides NOT DETECTED NOT DETECTED Final   Salmonella species NOT DETECTED NOT DETECTED Final   Yersinia enterocolitica NOT DETECTED NOT DETECTED Final   Vibrio species NOT DETECTED NOT DETECTED Final   Vibrio cholerae NOT DETECTED NOT DETECTED Final   Enteroaggregative E coli (EAEC) NOT DETECTED NOT DETECTED Final   Enteropathogenic E coli (EPEC) NOT DETECTED NOT DETECTED Final   Enterotoxigenic E coli (ETEC) NOT DETECTED NOT DETECTED Final   Shiga like toxin producing E coli (STEC) NOT DETECTED NOT DETECTED Final   Shigella/Enteroinvasive E coli (EIEC) NOT DETECTED  NOT DETECTED Final   Cryptosporidium NOT DETECTED NOT DETECTED Final   Cyclospora cayetanensis NOT DETECTED NOT DETECTED Final   Entamoeba histolytica NOT DETECTED NOT DETECTED Final   Giardia lamblia NOT DETECTED NOT DETECTED Final   Adenovirus F40/41 NOT DETECTED NOT DETECTED Final   Astrovirus NOT DETECTED NOT DETECTED Final   Norovirus GI/GII NOT DETECTED NOT DETECTED Final   Rotavirus A NOT DETECTED NOT DETECTED Final  Sapovirus (I, II, IV, and V) NOT DETECTED NOT DETECTED Final    Comment: Performed at [facility], [place], [place] [address]  C Difficile Quick Screen w PCR reflex     Status: None   Collection Time: 06/26/21  4:31 AM   Specimen: Stool  Result Value Ref Range Status   C Diff antigen NEGATIVE NEGATIVE Final   C Diff toxin NEGATIVE NEGATIVE Final   C Diff interpretation No C. difficile detected.  Final    Comment: Performed at [facility], [place] [address], [place], [place] [address]     Time coordinating discharge: 30 minutes  SIGNED:   Georgette Shell, MD  [facility] 06/29/2021, 6:27 PM

## 2021-06-29 NOTE — Telephone Encounter (Signed)
Transition Care Management Follow-up Telephone Call Date of discharge and from where: 06/28/2021,  [facility] How have you been since you were released from the hospital? She stated she is feeling a lot better. Any questions or concerns? No  Items Reviewed: Did the pt receive and understand the discharge instructions provided? Yes  Medications obtained and verified? Yes - she said she has all of her medications as well as a glucometer. She didn't have any questions about the med regime  Other? No  Any new allergies since your discharge? No  Dietary orders reviewed? Yes Do you have support at home? Yes , her son  [place] and Equipment/Supplies: Were home health services ordered? no If so, what is the name of the agency? N/a  Has the agency set up a time to come to the patient's home? not applicable Were any new equipment or medical supplies ordered?  No What is the name of the medical supply agency? N/a Were you able to get the supplies/equipment? not applicable Do you have any questions related to the use of the equipment or supplies? No  Functional Questionnaire: (I = Independent and D = Dependent) ADLs: independent   Follow up appointments reviewed:  PCP Hospital f/u appt confirmed? Yes  Scheduled to see Geryl Rankins, NP - 07/12/2021.  [facility] f/u appt confirmed?  None scheduled at this time.    Are transportation arrangements needed? No  If their condition worsens, is the pt aware to call PCP or go to the Emergency Dept.? Yes Was the patient provided with contact information for the PCP's office or ED? Yes Was to pt encouraged to call back with questions or concerns? Yes

## 2021-06-30 ENCOUNTER — Other Ambulatory Visit: Payer: Self-pay | Admitting: Nurse Practitioner

## 2021-06-30 ENCOUNTER — Encounter: Payer: Self-pay | Admitting: Nurse Practitioner

## 2021-06-30 ENCOUNTER — Other Ambulatory Visit: Payer: Self-pay

## 2021-06-30 DIAGNOSIS — F418 Other specified anxiety disorders: Secondary | ICD-10-CM

## 2021-06-30 DIAGNOSIS — F5105 Insomnia due to other mental disorder: Secondary | ICD-10-CM

## 2021-06-30 DIAGNOSIS — F32A Depression, unspecified: Secondary | ICD-10-CM

## 2021-06-30 MED ORDER — TRAZODONE HCL 150 MG PO TABS
150.0000 mg | ORAL_TABLET | Freq: Every evening | ORAL | 1 refills | Status: AC | PRN
  Filled 2021-06-30: qty 90, 90d supply, fill #0
  Filled 2021-08-26 – 2021-09-20 (×2): qty 90, 90d supply, fill #1

## 2021-06-30 MED ORDER — BUSPIRONE HCL 15 MG PO TABS
15.0000 mg | ORAL_TABLET | Freq: Two times a day (BID) | ORAL | 3 refills | Status: AC
  Filled 2021-06-30: qty 60, 30d supply, fill #0
  Filled 2021-08-02: qty 60, 30d supply, fill #1
  Filled 2021-08-26: qty 60, 30d supply, fill #2
  Filled 2021-09-20: qty 60, 30d supply, fill #3

## 2021-07-06 ENCOUNTER — Other Ambulatory Visit (HOSPITAL_COMMUNITY): Payer: 59

## 2021-07-12 ENCOUNTER — Ambulatory Visit: Payer: 59 | Admitting: Nurse Practitioner

## 2021-07-16 ENCOUNTER — Ambulatory Visit: Payer: 59 | Admitting: Urology

## 2021-08-02 ENCOUNTER — Other Ambulatory Visit: Payer: Self-pay | Admitting: Family Medicine

## 2021-08-02 ENCOUNTER — Other Ambulatory Visit: Payer: Self-pay

## 2021-08-02 ENCOUNTER — Other Ambulatory Visit: Payer: Self-pay | Admitting: Urology

## 2021-08-02 ENCOUNTER — Other Ambulatory Visit: Payer: Self-pay | Admitting: Nurse Practitioner

## 2021-08-02 DIAGNOSIS — F419 Anxiety disorder, unspecified: Secondary | ICD-10-CM

## 2021-08-02 DIAGNOSIS — E1165 Type 2 diabetes mellitus with hyperglycemia: Secondary | ICD-10-CM

## 2021-08-02 MED ORDER — TAMSULOSIN HCL 0.4 MG PO CAPS
0.4000 mg | ORAL_CAPSULE | Freq: Every day | ORAL | 0 refills | Status: DC
  Filled 2021-08-02: qty 30, 30d supply, fill #0

## 2021-08-03 ENCOUNTER — Other Ambulatory Visit: Payer: Self-pay

## 2021-08-03 ENCOUNTER — Ambulatory Visit: Payer: 59 | Admitting: Physician Assistant

## 2021-08-03 VITALS — BP 150/84 | HR 96 | Ht 63.0 in | Wt 230.0 lb

## 2021-08-03 DIAGNOSIS — G894 Chronic pain syndrome: Secondary | ICD-10-CM | POA: Diagnosis not present

## 2021-08-03 DIAGNOSIS — R8271 Bacteriuria: Secondary | ICD-10-CM

## 2021-08-03 DIAGNOSIS — N200 Calculus of kidney: Secondary | ICD-10-CM

## 2021-08-03 DIAGNOSIS — R109 Unspecified abdominal pain: Secondary | ICD-10-CM

## 2021-08-03 DIAGNOSIS — R310 Gross hematuria: Secondary | ICD-10-CM

## 2021-08-03 LAB — URINALYSIS, ROUTINE W REFLEX MICROSCOPIC
Bilirubin, UA: NEGATIVE
Glucose, UA: NEGATIVE
Ketones, UA: NEGATIVE
Leukocytes,UA: NEGATIVE
Nitrite, UA: NEGATIVE
Specific Gravity, UA: 1.025 (ref 1.005–1.030)
Urobilinogen, Ur: 0.2 mg/dL (ref 0.2–1.0)
pH, UA: 5 (ref 5.0–7.5)

## 2021-08-03 LAB — MICROSCOPIC EXAMINATION
RBC, Urine: >30 /hpf — AB (ref 0–2)
Renal Epithel, UA: NONE SEEN /hpf

## 2021-08-03 MED ORDER — ESCITALOPRAM OXALATE 20 MG PO TABS
20.0000 mg | ORAL_TABLET | Freq: Every day | ORAL | 1 refills | Status: DC
  Filled 2021-08-03: qty 30, 30d supply, fill #0
  Filled 2021-08-26: qty 30, 30d supply, fill #1

## 2021-08-03 MED ORDER — TRULICITY 0.75 MG/0.5ML ~~LOC~~ SOAJ
0.7500 mg | SUBCUTANEOUS | 0 refills | Status: DC
  Filled 2021-08-03: qty 2, 28d supply, fill #0

## 2021-08-03 MED ORDER — GABAPENTIN 300 MG PO CAPS
300.0000 mg | ORAL_CAPSULE | Freq: Three times a day (TID) | ORAL | 0 refills | Status: DC
  Filled 2021-08-03: qty 90, 30d supply, fill #0

## 2021-08-03 MED ORDER — KETOROLAC TROMETHAMINE 60 MG/2ML IM SOLN
30.0000 mg | Freq: Once | INTRAMUSCULAR | Status: AC
  Administered 2021-08-03: 30 mg via INTRAMUSCULAR

## 2021-08-03 NOTE — Telephone Encounter (Signed)
Requested medication (s) are due for refill today: yes  Requested medication (s) are on the active medication list: yes  Last refill:  06/16/21 2 ml  Future visit scheduled: yes appt in August for hospital f/u (pt on wait list for sooner appt)  Notes to clinic:  has appt for hospital f/u needs sooner appt pt on waitlist   Requested Prescriptions  Pending Prescriptions Disp Refills   Dulaglutide (TRULICITY) 0.75 MG/0.5ML SOPN 2 mL 0    Sig: Inject 0.75 mg into the skin once a week.     Endocrinology:  Diabetes - GLP-1 Receptor Agonists Passed - 08/02/2021 12:03 PM      Passed - HBA1C is between 0 and 7.9 and within 180 days    Hgb A1c MFr Bld  Date Value Ref Range Status  06/18/2021 7.1 (H) 4.8 - 5.6 % Final    Comment:    (NOTE) Pre diabetes:          5.7%-6.4%  Diabetes:              >6.4%  Glycemic control for   <7.0% adults with diabetes          Passed - Valid encounter within last 6 months    Recent Outpatient Visits           5 months ago Encounter to establish care   [facility] [place], Shea Stakes, NP       Future Appointments             Today [place], Regan Rakers, PA-C [facility] [place]   In 2 months Claiborne Rigg, NP [facility]

## 2021-08-03 NOTE — Progress Notes (Signed)
Assessment: 1. Kidney stones - Urinalysis, Routine w reflex microscopic - Ultrasound renal complete; Future  2. Flank pain - ketorolac (TORADOL) injection 30 mg - Ultrasound renal complete; Future  3. Bacteriuria - Urine Culture  4. Chronic pain syndrome  5. Gross hematuria    Plan: Recent medical records reviewed from outside facilities.  Imaging studies and labs reviewed, all during office visit.  Discussed the patient's presentation with Dr. Alyson Ingles who agrees that it is inappropriate to prescribe Dilaudid for her pain as there is no evidence of obstruction or urinary condition that would indicate a refill.  She is offered injection of Toradol which she accepts.  Urine culture placed due to isolated bacteriuria.  Discussed the need for follow-up renal ultrasound as previously recommended.  The order is placed.  3-monthfollow-up after ultrasound.  [place] and recommended evaluation for chronic pain control.  The patient conveyed frustration, but conveyed understanding with the plan.  Chief Complaint: No chief complaint on file.   HPI: Jamie Branumis a 46y.o. female with history of left-sided ureteral stone manipulation with placement of ureteral stent on 06/22/2021 who presents for continued evaluation of left sided flank pain. [facility] but [place] insurance and drove here today requesting refill of dilaudid for 2 week h/o worsening pain.  Currently, pain is a 9 on a scale of 0-10, worse with motion, better when she is in water.  He radiates to the anterior flank.  No current vomiting but she continues to have nausea for which she takes Zofran.  She continues to complain of gross hematuria which she states she has had ongoing since surgical stone manipulation.  No current fevers, chills, dysuria, frequency, urgency.  [facility] [place]  Patient was admitted with flank pain and diagnosed with pyelonephritis although imaging studies revealed no evidence of infection or obstruction.  [facility] for the same complaint.  Another CT ordered with no evidence of obstruction, infection, ureteral stone.  She continues to show 6 to 8 mm stone in the  lower pole of the left kidney.  She was discharged on Keflex 500 4 times daily for 7 days and should complete that prescription on 08/07/2021.  She was also given 12 Norco 5 325 tablets as well as 12 Dilaudid 4 mg tablets on 07/31/2021. The patient is noted to have canceled her postop evaluation with Dr. MAlyson Ingleson 07/16/2021 UA = 0-5 WBCs, greater than 30 RBCs, moderate bacteria, nitrite negative  06/15/21 Jamie Conley a 89yohere for followup for nephrolithiasis. She underwent left ureteral stent placement over 1 week ago and since then she has noted severe left flank pain not alleviated with narcotics. She has been to the ER for the pain. The pain is sharp, intermittent, moderate to severe and nonraditing. She has associated urinary urgency and frequency. No hematuria or dysuria. No other complaints today Portions of the above documentation were copied from a prior visit for review purposes only.  Allergies: No Known Allergies  PMH: Past Medical History:  Diagnosis Date   Anxiety    Asthma    [place]    Depression    [place]    Enlarged liver    Fibromyalgia    High cholesterol  History of kidney stones    Hypertension    Migraine     PSH: Past Surgical History:  Procedure Laterality Date   ABDOMINAL HYSTERECTOMY     ANTERIOR AND POSTERIOR REPAIR     colon polyectomy     CYSTOSCOPY WITH RETROGRADE PYELOGRAM, URETEROSCOPY AND STENT PLACEMENT Left 06/09/2021   Procedure: CYSTOSCOPY WITH RETROGRADE PYELOGRAM, URETEROSCOPY AND STENT PLACEMENT;  Surgeon:  Cleon Gustin, MD;  Location: [facility];  Service: Urology;  Laterality: Left;   CYSTOSCOPY WITH RETROGRADE PYELOGRAM, URETEROSCOPY AND STENT PLACEMENT Left 06/22/2021   Procedure: CYSTOSCOPY WITH RETROGRADE PYELOGRAM, URETEROSCOPY AND STENT EXCHANGE;  Surgeon: Cleon Gustin, MD;  Location: [facility];  Service: Urology;  Laterality: Left;   CYSTOSCOPY/URETEROSCOPY/HOLMIUM LASER/STENT PLACEMENT Left 01/12/2021   Procedure: CYSTOSCOPY/URETEROSCOPY/HOLMIUM LASER/STENT PLACEMENT;  Surgeon: Vira Agar, MD;  Location: [facility];  Service: Urology;  Laterality: Left;   LEFT OOPHORECTOMY     LITHOTRIPSY     NASAL POLYP SURGERY     SPLENECTOMY     WRIST SURGERY Left     SH: Social History   Tobacco Use   Smoking status: Never   Smokeless tobacco: Never  Vaping Use   Vaping Use: Never used  Substance Use Topics   Alcohol use: Never   Drug use: Yes    Comment: Delta 8 gummies    ROS: All other review of systems were reviewed and are negative except what is noted above in HPI  PE: BP (!) 150/84   Pulse 96   Ht '5\' 3"'$  (1.6 m)   Wt 230 lb (104.3 kg)   BMI 40.74 kg/m  GENERAL APPEARANCE:  Well appearing, well developed, well nourished, NAD HEENT:  Atraumatic, normocephalic NECK:  Supple. Trachea midline ABDOMEN:  Soft, non-tender, no masses EXTREMITIES:  Moves all extremities well NEUROLOGIC:  Alert and oriented x 3, normal gait, CN II-XII grossly intact MENTAL STATUS:  appropriate BACK:  Non-tender to palpation, bilateral CVA tenderness noted SKIN:  Warm, dry, and intact   Results: Laboratory Data: Lab Results  Component Value Date   WBC 19.8 (H) 06/28/2021   HGB 9.6 (L) 06/28/2021   HCT 29.9 (L) 06/28/2021   MCV 100.3 (H) 06/28/2021   PLT 618 (H) 06/28/2021    Lab Results  Component Value Date   CREATININE 0.82 06/28/2021    No results found for: "PSA"  No results found for: "TESTOSTERONE"  Lab Results  Component Value Date   HGBA1C 7.1 (H) 06/18/2021     Urinalysis    Component Value Date/Time   COLORURINE STRAW (A) 06/24/2021 1255   APPEARANCEUR CLEAR 06/24/2021 1255   APPEARANCEUR Clear 05/31/2021 0800   LABSPEC 1.008 06/24/2021 1255   PHURINE 9.0 (H) 06/24/2021 1255   GLUCOSEU 150 (A) 06/24/2021 1255   HGBUR SMALL (A) 06/24/2021 1255   BILIRUBINUR NEGATIVE 06/24/2021 1255   BILIRUBINUR Negative 05/31/2021 0800   KETONESUR 20 (A) 06/24/2021 1255   PROTEINUR NEGATIVE 06/24/2021 1255   NITRITE NEGATIVE 06/24/2021 1255   LEUKOCYTESUR NEGATIVE 06/24/2021 1255    Lab Results  Component Value Date   LABMICR Comment 05/31/2021   BACTERIA RARE (A) 06/24/2021    Pertinent Imaging: No results found for this or any previous visit.  No results found for this or any previous visit.  No results found for this or any previous visit.  No results found for this or any previous visit.  No results found for this or any previous visit.  No results found for this  or any previous visit.  No results found for this or any previous visit.  Results for orders placed during the hospital encounter of 06/16/21  CT Renal Stone Study  Narrative CLINICAL DATA:  Flank pain, kidney stone suspected  Left flank pain.  Stent placement.  EXAM: CT ABDOMEN AND PELVIS WITHOUT CONTRAST  TECHNIQUE: Multidetector CT imaging of the abdomen and pelvis was performed following the standard protocol without IV contrast.  RADIATION DOSE REDUCTION: This exam was performed according to the departmental dose-optimization program which includes automated exposure control, adjustment of the mA and/or kV according to patient size and/or use of iterative reconstruction technique.  COMPARISON:  CT 06/03/2021  FINDINGS: Lower chest: No acute findings.  Hepatobiliary: Diffusely decreased hepatic density consistent with steatosis. There is focal fatty sparing adjacent to the gallbladder fossa. The liver is enlarged spanning 21.8 cm cranial caudal.  No discrete liver lesion. Gallbladder physiologically distended, no calcified stone. No biliary dilatation.  Pancreas: No ductal dilatation or inflammation.  Spleen: Splenectomy.  Minimal splenosis in the left upper quadrant.  Adrenals/Urinary Tract: Normal adrenal glands. Left nephroureteral stent in place, proximal pigtail in the renal pelvis, distal pigtail in the bladder. The renal collecting system is decompressed. There is mild left perinephric edema. 7 mm nonobstructing stone in the lower left kidney. Punctate nonobstructing left upper pole stone there are no stone or stone fragments along the course of the stent. There is no right hydronephrosis or renal calculus. The urinary bladder is completely empty. No bladder stone.  Stomach/Bowel: Decompressed stomach. There is no small bowel obstruction or inflammation. Normal appendix. Moderate volume of colonic stool. No colonic wall thickening or pericolonic edema.  Vascular/Lymphatic: Mild aortic atherosclerosis. No aortic aneurysm. No abdominopelvic adenopathy.  Reproductive: Hysterectomy.  No adnexal mass.  Other: No ascites or free air. No abdominopelvic collection. There is a moderate-sized fat containing umbilical hernia mild edema diffusely in the subcutaneous fat.  Musculoskeletal: Mild L5-S1 disc space narrowing. There are no acute or suspicious osseous abnormalities.  IMPRESSION: 1. Left nephroureteral stent in place with decompression of the renal collecting system. No stone along the course of the stent. Nonobstructing left nephrolithiasis. 2. Minimal left perinephric edema, recommend correlation with urinalysis to assess for urinary tract infection. 3. Hepatic steatosis and hepatomegaly. 4. Moderate-sized fat containing umbilical hernia.  Aortic Atherosclerosis (ICD10-I70.0).   Electronically Signed By: Keith Rake M.D. On: 06/16/2021 20:33  No results found for this or any previous visit (from the  past 24 hour(s)).

## 2021-08-05 LAB — URINE CULTURE

## 2021-08-09 ENCOUNTER — Ambulatory Visit (HOSPITAL_COMMUNITY)
Admission: RE | Admit: 2021-08-09 | Discharge: 2021-08-09 | Disposition: A | Discharge status: Home / Self Care | Payer: 59 | Source: Ambulatory Visit | Attending: Physician Assistant | Admitting: Physician Assistant

## 2021-08-09 DIAGNOSIS — R109 Unspecified abdominal pain: Secondary | ICD-10-CM | POA: Insufficient documentation

## 2021-08-09 DIAGNOSIS — N200 Calculus of kidney: Secondary | ICD-10-CM | POA: Diagnosis present

## 2021-08-11 ENCOUNTER — Other Ambulatory Visit: Payer: Self-pay

## 2021-08-16 ENCOUNTER — Ambulatory Visit: Payer: 59 | Admitting: Physician Assistant

## 2021-08-16 ENCOUNTER — Ambulatory Visit: Payer: 59 | Admitting: Urology

## 2021-08-16 DIAGNOSIS — N200 Calculus of kidney: Secondary | ICD-10-CM

## 2021-08-26 ENCOUNTER — Other Ambulatory Visit: Payer: Self-pay | Admitting: Nurse Practitioner

## 2021-08-26 ENCOUNTER — Other Ambulatory Visit: Payer: Self-pay | Admitting: Family Medicine

## 2021-08-26 ENCOUNTER — Other Ambulatory Visit: Payer: Self-pay | Admitting: Urology

## 2021-08-26 ENCOUNTER — Other Ambulatory Visit (HOSPITAL_COMMUNITY): Payer: Self-pay

## 2021-08-26 DIAGNOSIS — I10 Essential (primary) hypertension: Secondary | ICD-10-CM

## 2021-08-26 DIAGNOSIS — E1165 Type 2 diabetes mellitus with hyperglycemia: Secondary | ICD-10-CM

## 2021-08-27 ENCOUNTER — Other Ambulatory Visit (HOSPITAL_COMMUNITY): Payer: Self-pay

## 2021-08-27 MED ORDER — TRULICITY 0.75 MG/0.5ML ~~LOC~~ SOAJ
0.7500 mg | SUBCUTANEOUS | 0 refills | Status: AC
  Filled 2021-08-27 – 2021-09-20 (×2): qty 2, 28d supply, fill #0

## 2021-08-27 MED ORDER — LISINOPRIL 20 MG PO TABS
20.0000 mg | ORAL_TABLET | Freq: Every day | ORAL | 0 refills | Status: DC
  Filled 2021-08-27: qty 30, 30d supply, fill #0

## 2021-08-27 MED ORDER — METFORMIN HCL 1000 MG PO TABS
1000.0000 mg | ORAL_TABLET | Freq: Two times a day (BID) | ORAL | 0 refills | Status: DC
  Filled 2021-08-27: qty 60, 30d supply, fill #0

## 2021-08-27 MED FILL — Insulin Detemir Soln Pen-injector 100 Unit/ML: SUBCUTANEOUS | 30 days supply | Qty: 6 | Fill #0 | Status: CN

## 2021-08-29 MED ORDER — GABAPENTIN 300 MG PO CAPS
300.0000 mg | ORAL_CAPSULE | Freq: Three times a day (TID) | ORAL | 0 refills | Status: AC
  Filled 2021-08-29 – 2021-09-20 (×2): qty 90, 30d supply, fill #0

## 2021-08-30 ENCOUNTER — Other Ambulatory Visit (HOSPITAL_COMMUNITY): Payer: Self-pay

## 2021-08-31 ENCOUNTER — Other Ambulatory Visit (HOSPITAL_COMMUNITY): Payer: Self-pay

## 2021-08-31 MED ORDER — TAMSULOSIN HCL 0.4 MG PO CAPS
0.4000 mg | ORAL_CAPSULE | Freq: Every day | ORAL | 0 refills | Status: AC
Start: 2021-08-31 — End: ?
  Filled 2021-08-31: qty 30, 30d supply, fill #0

## 2021-09-09 ENCOUNTER — Other Ambulatory Visit (HOSPITAL_COMMUNITY): Payer: Self-pay

## 2021-09-13 ENCOUNTER — Other Ambulatory Visit: Payer: Self-pay

## 2021-09-20 ENCOUNTER — Other Ambulatory Visit (HOSPITAL_COMMUNITY): Payer: Self-pay

## 2021-09-20 ENCOUNTER — Other Ambulatory Visit: Payer: Self-pay | Admitting: Family Medicine

## 2021-09-20 ENCOUNTER — Other Ambulatory Visit: Payer: Self-pay | Admitting: Nurse Practitioner

## 2021-09-20 DIAGNOSIS — Z794 Long term (current) use of insulin: Secondary | ICD-10-CM

## 2021-09-20 DIAGNOSIS — I10 Essential (primary) hypertension: Secondary | ICD-10-CM

## 2021-09-20 DIAGNOSIS — F419 Anxiety disorder, unspecified: Secondary | ICD-10-CM

## 2021-09-20 MED ORDER — METFORMIN HCL 1000 MG PO TABS
1000.0000 mg | ORAL_TABLET | Freq: Two times a day (BID) | ORAL | 0 refills | Status: AC
  Filled 2021-09-20: qty 60, 30d supply, fill #0

## 2021-09-20 MED ORDER — LISINOPRIL 20 MG PO TABS
20.0000 mg | ORAL_TABLET | Freq: Every day | ORAL | 0 refills | Status: AC
  Filled 2021-09-20: qty 30, 30d supply, fill #0

## 2021-09-20 MED ORDER — ESCITALOPRAM OXALATE 20 MG PO TABS
20.0000 mg | ORAL_TABLET | Freq: Every day | ORAL | 0 refills | Status: AC
  Filled 2021-09-20: qty 30, 30d supply, fill #0

## 2021-09-20 MED FILL — Insulin Detemir Soln Pen-injector 100 Unit/ML: SUBCUTANEOUS | 30 days supply | Qty: 6 | Fill #0 | Status: AC

## 2021-09-21 ENCOUNTER — Other Ambulatory Visit (HOSPITAL_COMMUNITY): Payer: Self-pay

## 2021-09-22 ENCOUNTER — Other Ambulatory Visit (HOSPITAL_COMMUNITY): Payer: Self-pay

## 2021-09-23 ENCOUNTER — Other Ambulatory Visit (HOSPITAL_COMMUNITY): Payer: Self-pay

## 2021-10-04 ENCOUNTER — Inpatient Hospital Stay: Payer: 59 | Admitting: Nurse Practitioner

## 2021-10-12 ENCOUNTER — Other Ambulatory Visit (HOSPITAL_COMMUNITY): Payer: Self-pay

## 2021-11-08 ENCOUNTER — Other Ambulatory Visit (HOSPITAL_COMMUNITY): Payer: Self-pay

## 2022-01-14 ENCOUNTER — Other Ambulatory Visit: Payer: Self-pay

## 2022-07-18 NOTE — Telephone Encounter (Signed)
complete

## 2024-03-04 IMAGING — CT CT RENAL STONE PROTOCOL
2 of 4 series · 17 of 46 positions shown, 19 images · non-contrast
Comparison: CT abdomen and pelvis dated May 24, 2021

CLINICAL DATA: Bilateral flank pain



[Series 2: stone full · axial · 0.88mm/px · z∈[+546,+981]mm · 14 of 95 slices shown, 16 images]
[im 4/95  soft-tissue]
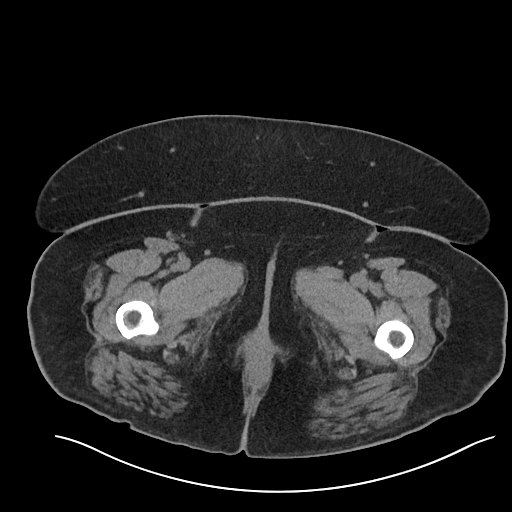
[im 4/95  bone]
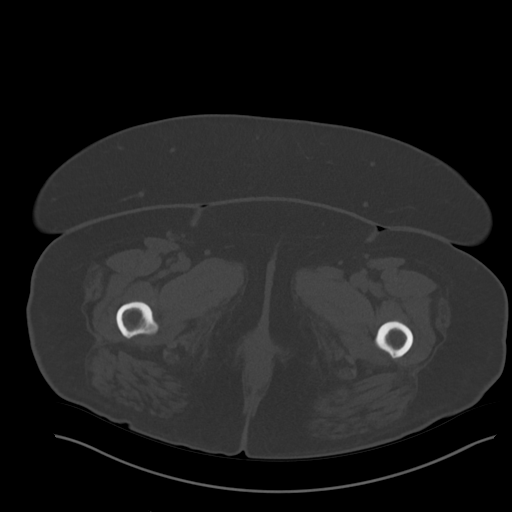
[im 12/95  soft-tissue]
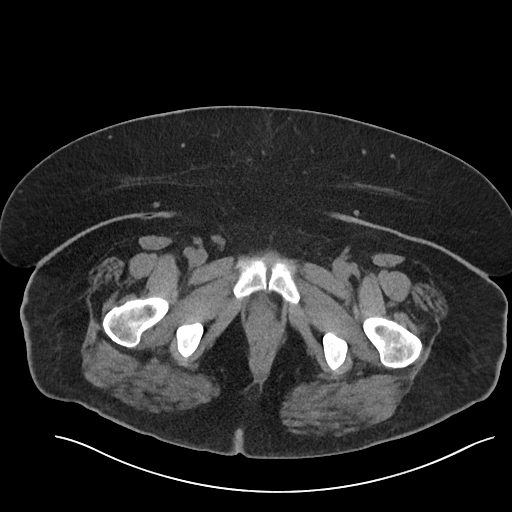
[im 20/95  soft-tissue]
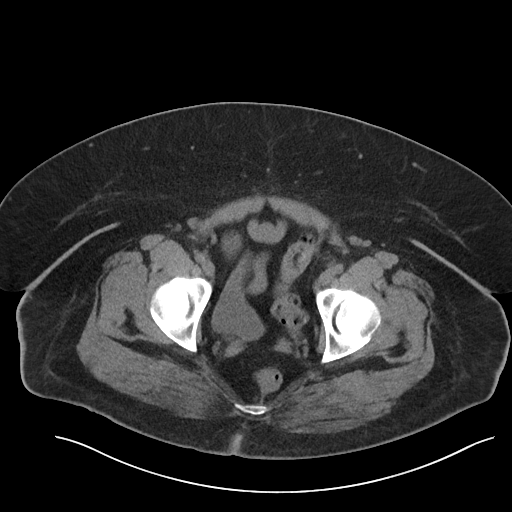
[im 24/95  soft-tissue]
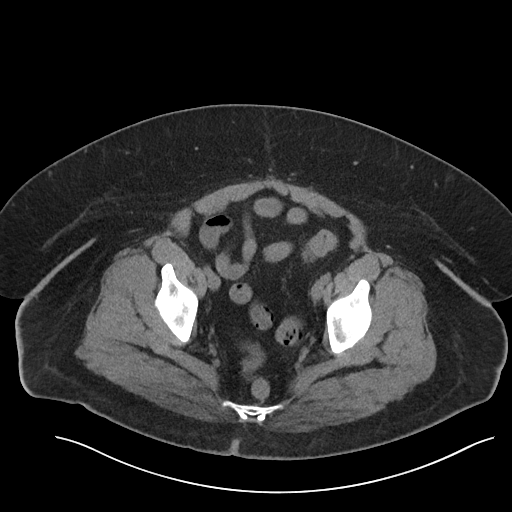
[im 32/95  soft-tissue]
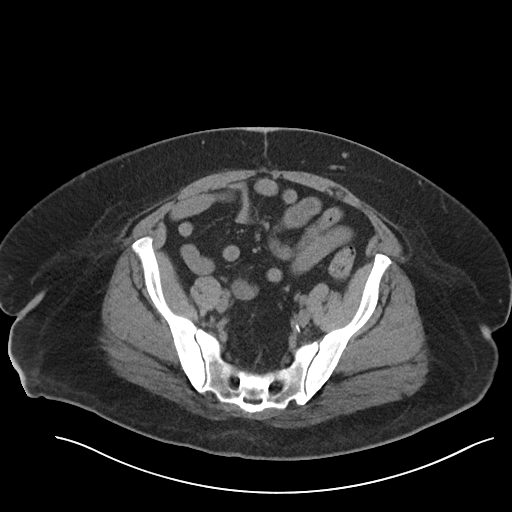
[im 40/95  soft-tissue]
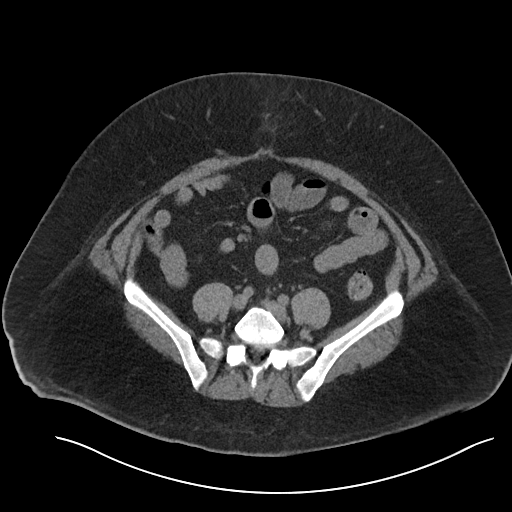
[im 44/95  soft-tissue]
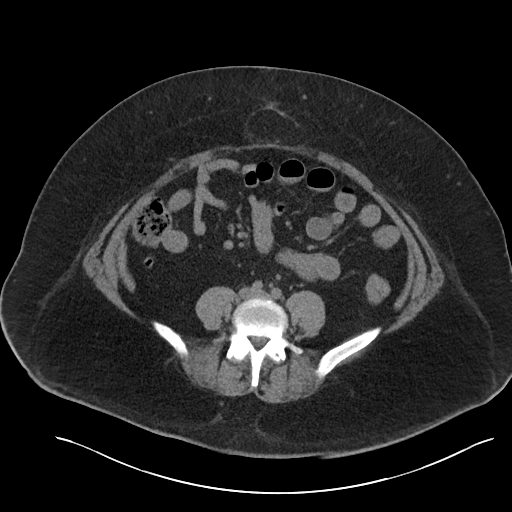
[im 51/95  soft-tissue]
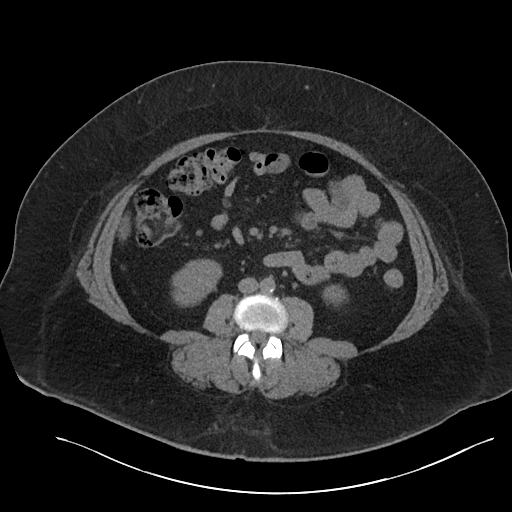
[im 55/95  soft-tissue]
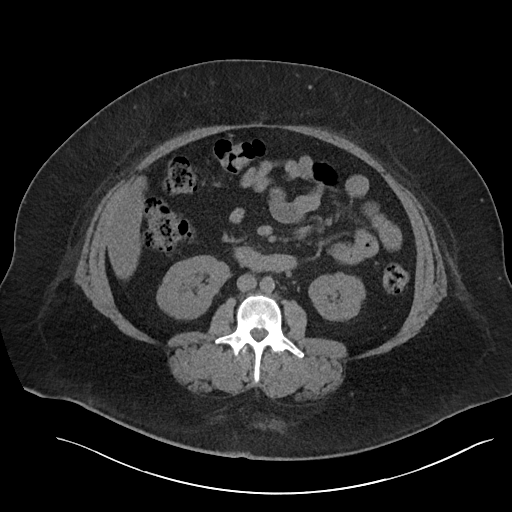
[im 55/95  bone]
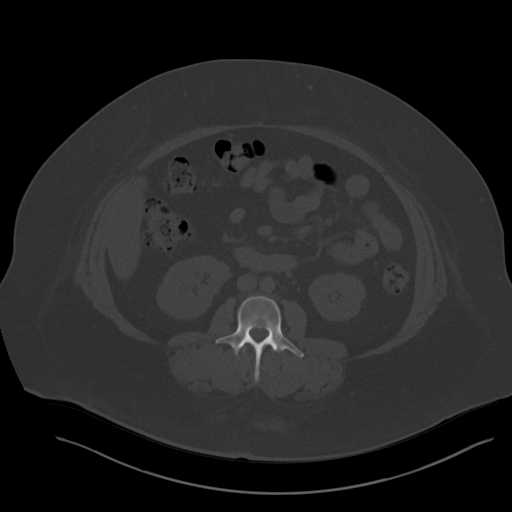
[im 63/95  soft-tissue]
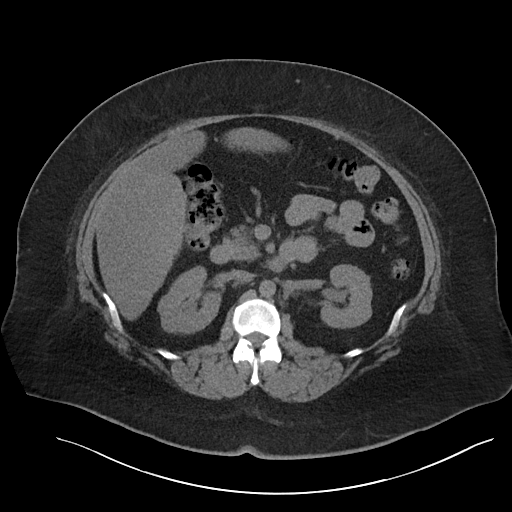
[im 71/95  soft-tissue]
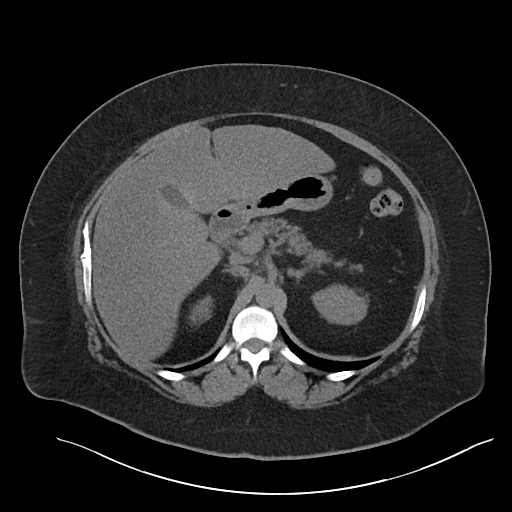
[im 75/95  soft-tissue]
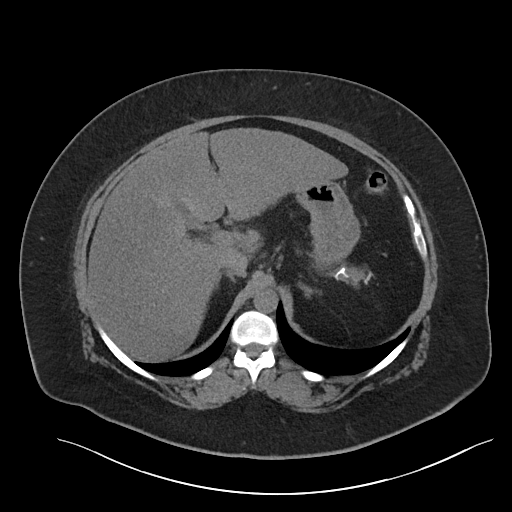
[im 83/95  soft-tissue]
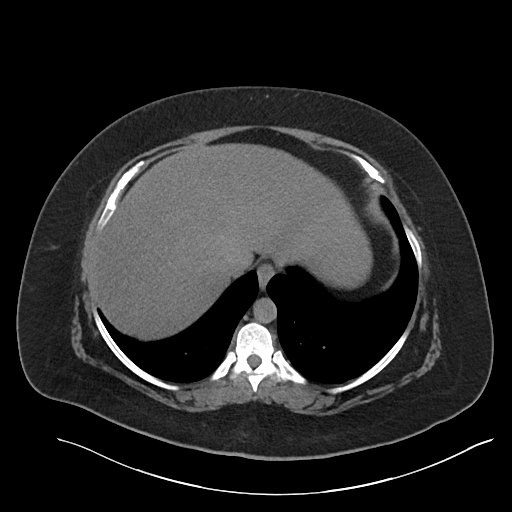
[im 91/95  soft-tissue]
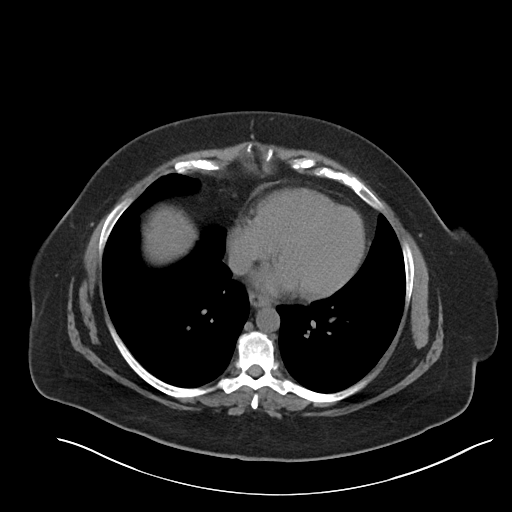

[Series 5: coronal · coronal · 0.93mm/px · 3 of 119 slices shown]
[im 40/119  soft-tissue]
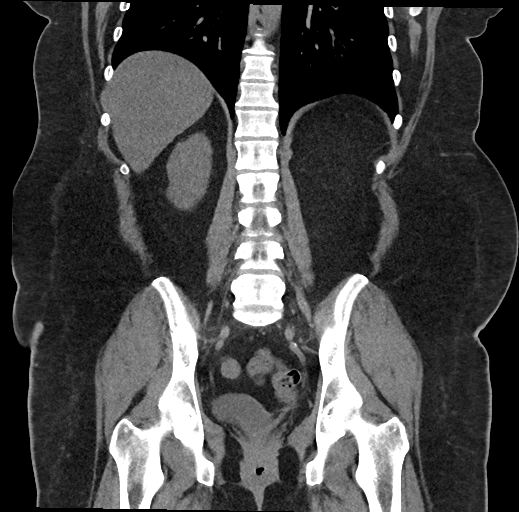
[im 53/119  soft-tissue]
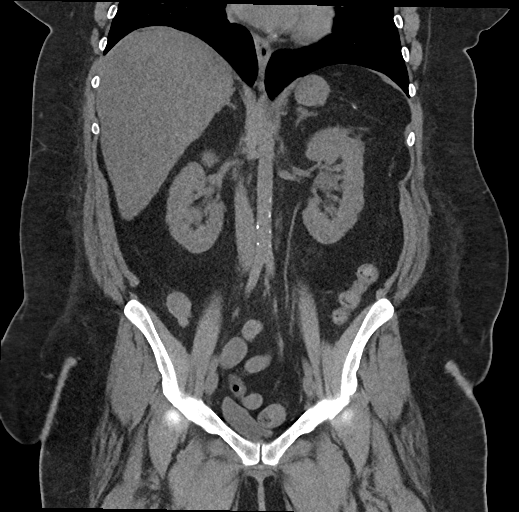
[im 66/119  soft-tissue]
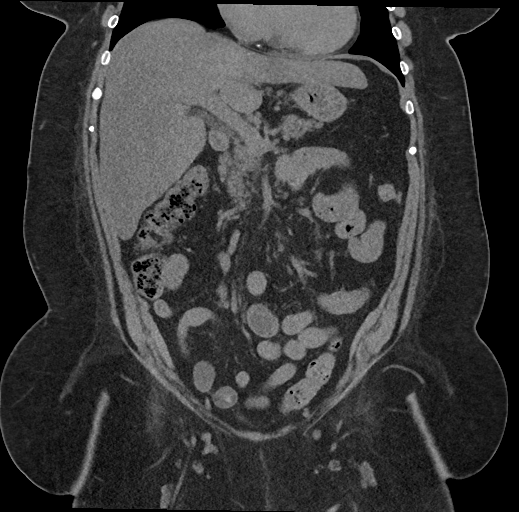

[17 of 46 positions shown; findings below may reference images not displayed]

FINDINGS: Lower chest: No acute abnormality.

Hepatobiliary: Hepatic steatosis. Gallbladder is unremarkable. No
biliary ductal dilation.

Pancreas: Unremarkable. No pancreatic ductal dilatation or
surrounding inflammatory changes.

Spleen: Prior splenectomy.

Adrenals/Urinary Tract: Bilateral adrenal glands are unremarkable.
Nonobstructing stone of the lower pole of the left kidney. No
hydronephrosis. Bladder is unremarkable.

Stomach/Bowel: Stomach is within normal limits. Appendix appears
normal. No evidence of bowel wall thickening, distention, or
inflammatory changes.

Vascular/Lymphatic: Aortic atherosclerosis. No enlarged abdominal or
pelvic lymph nodes.

Reproductive: No adnexal masses.

Other: Small fat containing umbilical hernia. No abdominopelvic
ascites.

Musculoskeletal: No acute or significant osseous findings.
IMPRESSION: 1. No acute findings in the abdomen or pelvis, including no evidence
of obstructive uropathy.
2. Nonobstructing stone of the lower pole the left kidney.
3. Hepatic steatosis.
4.  Aortic Atherosclerosis ([id number].C).

## 2024-03-25 IMAGING — CT CT ABD-PELV W/ CM
2 of 5 series · 15 of 46 positions shown, 17 images · IV contrast (agent unspecified)
Comparison: Prior CT scan of the abdomen and pelvis 06/16/2021

CLINICAL DATA: Diffuse acute abdominal pain

EXAM:
CT ABDOMEN AND PELVIS WITH CONTRAST
TECHNIQUE: Multidetector CT imaging of the abdomen and pelvis was performed
using the standard protocol following bolus administration of
intravenous contrast.

[Series 2: axial st · axial · 0.98mm/px · z∈[+1102,+1492]mm · 12 of 94 slices shown, 14 images]
[im 8/94  soft-tissue]
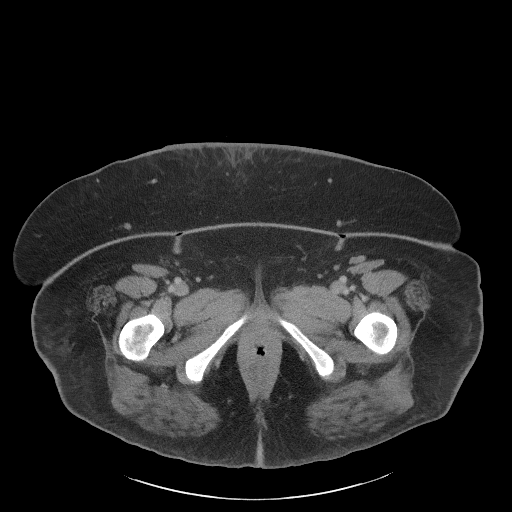
[im 8/94  bone]
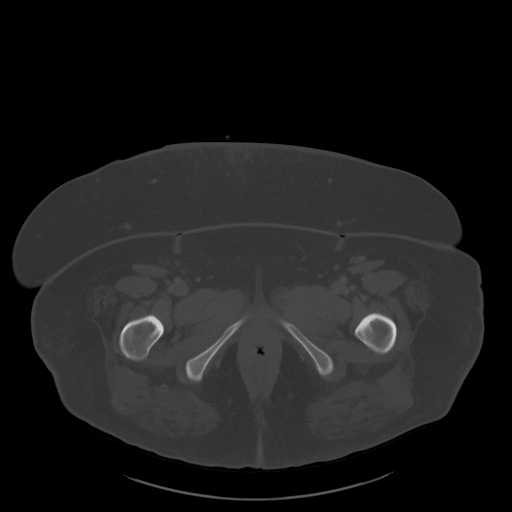
[im 15/94  soft-tissue]
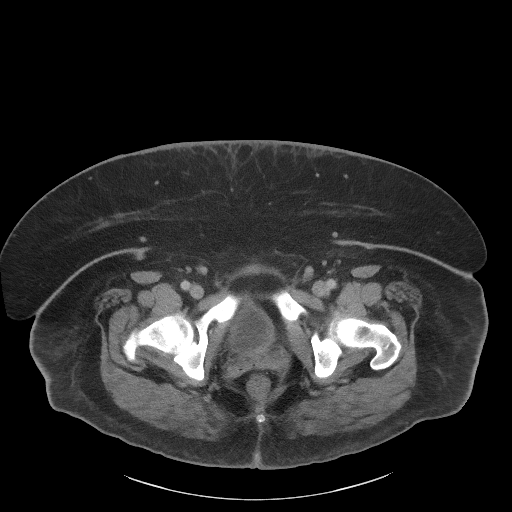
[im 22/94  soft-tissue]
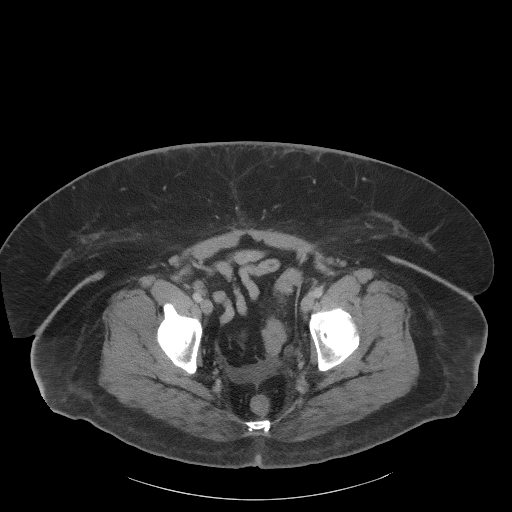
[im 29/94  soft-tissue]
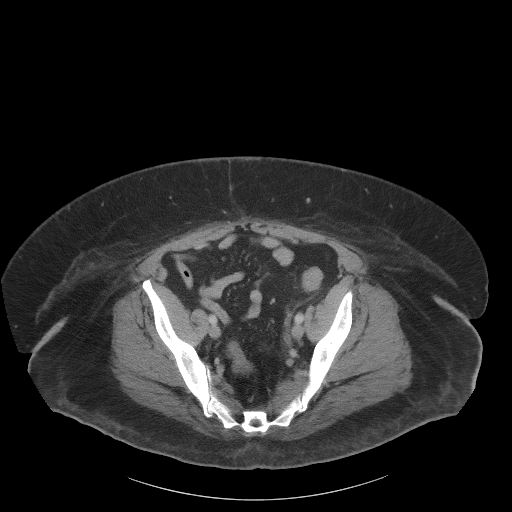
[im 36/94  soft-tissue]
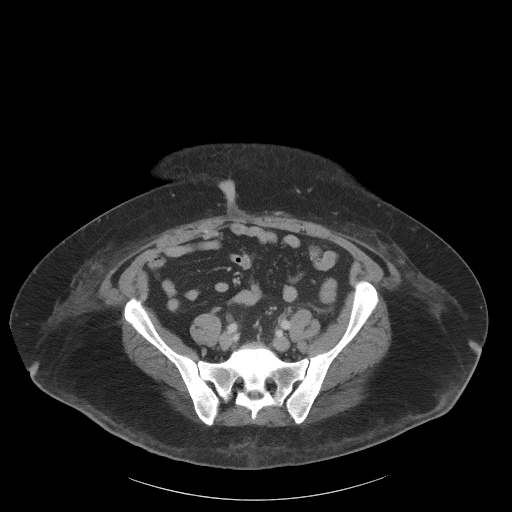
[im 43/94  soft-tissue]
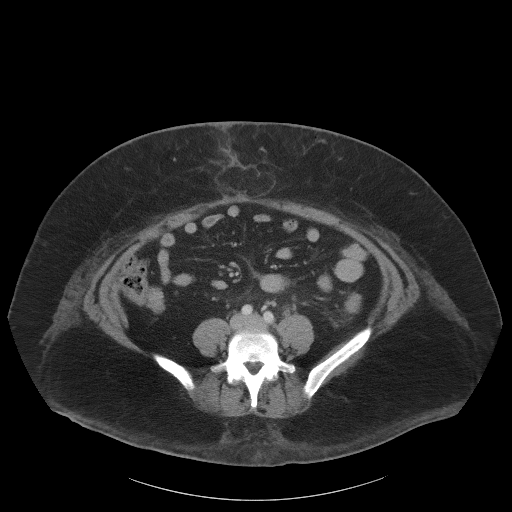
[im 51/94  soft-tissue]
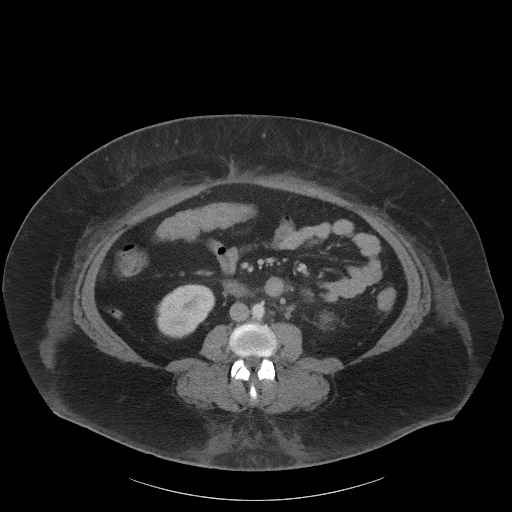
[im 58/94  soft-tissue]
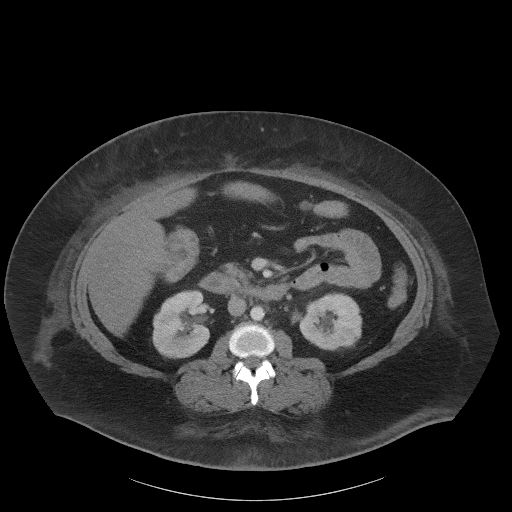
[im 65/94  soft-tissue]
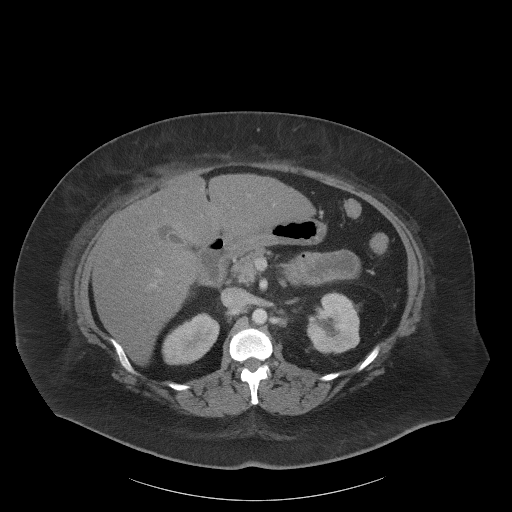
[im 65/94  bone]
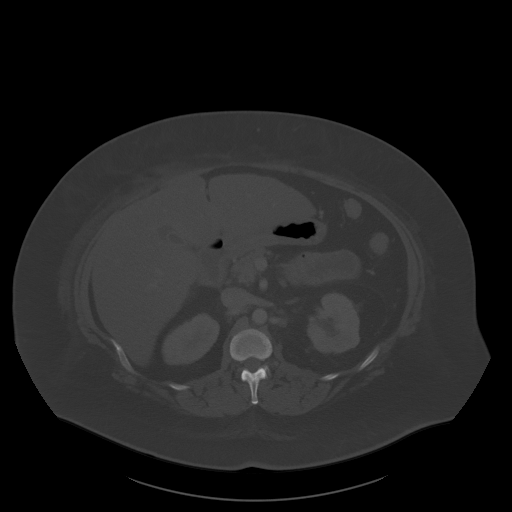
[im 72/94  soft-tissue]
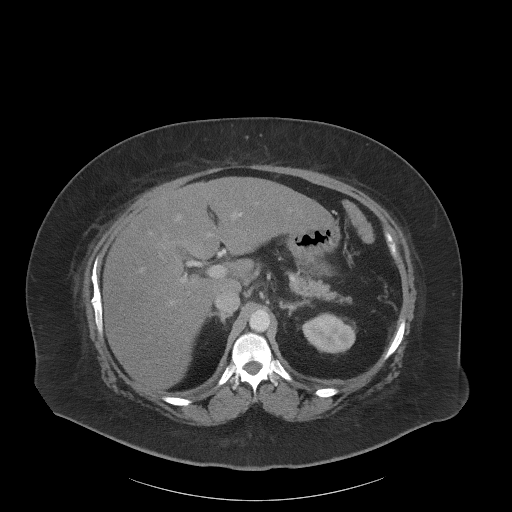
[im 79/94  soft-tissue]
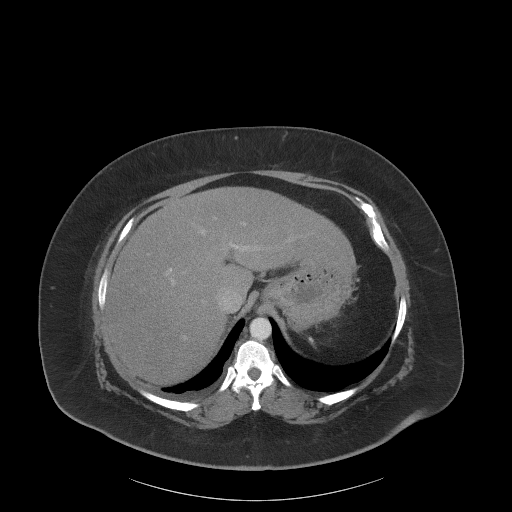
[im 86/94  soft-tissue]
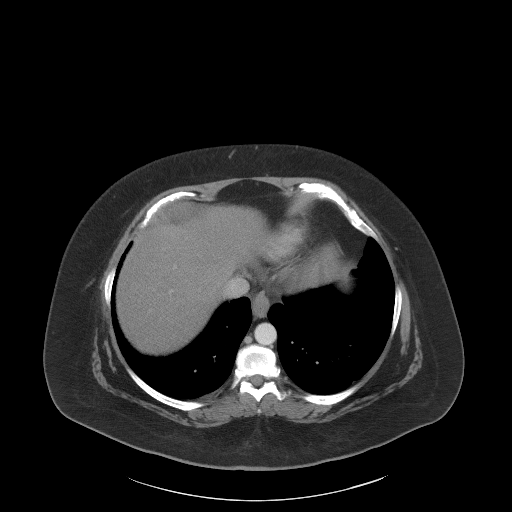

[Series 5: coronal st · coronal · 0.96mm/px · 3 of 123 slices shown]
[im 41/123  soft-tissue]
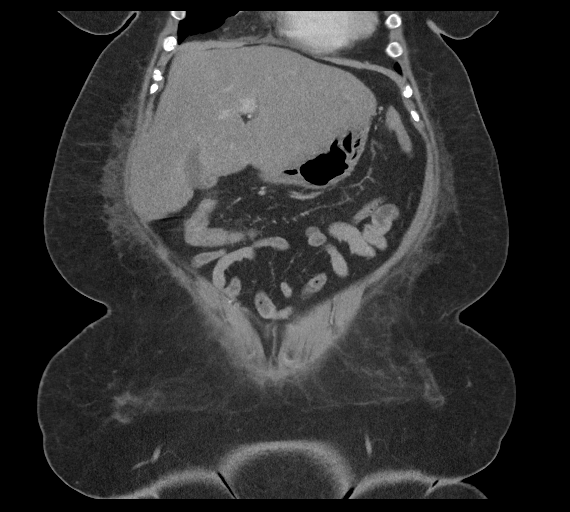
[im 55/123  soft-tissue]
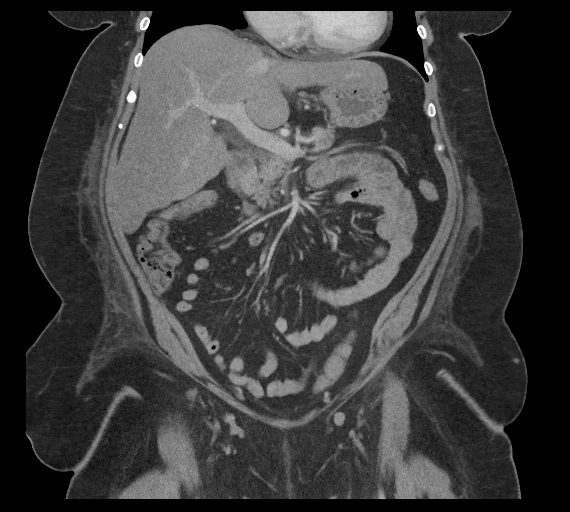
[im 68/123  soft-tissue]
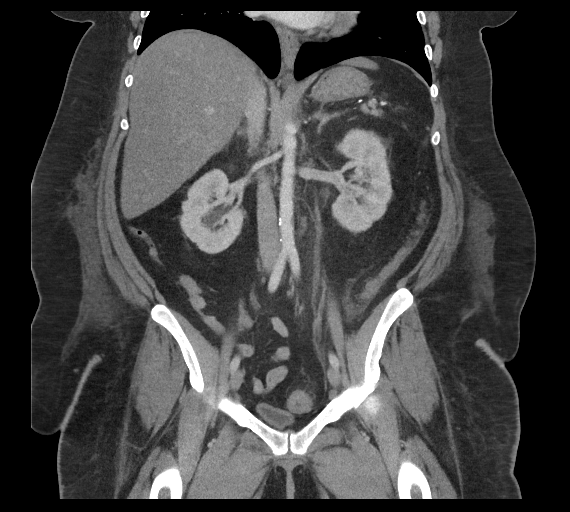

[15 of 46 positions shown; findings below may reference images not displayed]

RADIATION DOSE REDUCTION: This exam was performed according to the
departmental dose-optimization program which includes automated
exposure control, adjustment of the mA and/or kV according to
patient size and/or use of iterative reconstruction technique.

CONTRAST:  100mL OMNIPAQUE IOHEXOL 300 MG/ML  SOLN
FINDINGS: Lower chest: 4 mm nonspecific pulmonary nodule in the posterior
right lower lobe (image 2 series 4) approximately 7 mm subpleural
nodule in the periphery of the right lower lobe also seen on the
same image slice. Smaller subpleural nodule/lymph nodes scattered
along the pleural surface of the left lower lobe, none measuring
larger than 7 mm. The lung bases are otherwise clear. The visualized
cardiac structures are within normal limits. Unremarkable distal
thoracic esophagus.

Hepatobiliary: Low attenuation of the hepatic parenchyma with focal
sparing around the gallbladder fossa. No discrete hepatic lesion. No
intra or extrahepatic biliary ductal dilatation. Diffuse mild
gallbladder wall edema without evidence of distension or
cholelithiasis. No pericholecystic inflammatory changes.

Pancreas: Unremarkable. No pancreatic ductal dilatation or
surrounding inflammatory changes.

Spleen: Prior splenectomy.

Adrenals/Urinary Tract: Normal adrenal glands. The right kidney is
normal in appearance. Approximately 6 mm stone in the lower pole of
the left collecting system. Interval removal of ureteral stent. No
hydronephrosis. Mild left ureterectasis. The bladder is
decompressed.

Stomach/Bowel: Normal appearance of the stomach and duodenum. No
evidence of obstruction or focal bowel wall thickening. Normal
appendix in the right lower quadrant. The terminal ileum is
unremarkable.

Vascular/Lymphatic: No evidence of aneurysm. Scattered
atherosclerotic plaque. No suspicious lymphadenopathy.

Reproductive: Status post hysterectomy. No adnexal masses.

Other: Omental fat containing midline ventral abdominal wall hernia,
unchanged. New high attenuation nodular opacity in the superficial
subcutaneous fat of the right lower quadrant abdominal wall likely
reflecting a small contusion given appearance over the 8 days.

Musculoskeletal: No acute or significant osseous findings.
IMPRESSION: 1. No acute abnormality within the abdomen or pelvis.
2. Interval removal of left-sided double-J ureteral stent.
Persistent mild left ureterectasis with inflammatory stranding in
the Peri ureteral fat. Findings suggest residual inflammation, or
less likely an infectious process (considered very unlikely).
3. Multiple small bilateral pulmonary nodules and subpleural
nodules/lymph nodes. Non-contrast chest CT at 3-6 months is
recommended. If the nodules are stable at time of repeat CT, then
future CT at 18-24 months (from today's scan) is considered optional
for low-risk patients, but is recommended for high-risk patients.
This recommendation follows the consensus statement: Guidelines for
Management of Incidental Pulmonary Nodules Detected on CT Images:
4.  Aortic Atherosclerosis ([id number]-170.0)
5. Stable moderately large omental fat containing midline ventral
hernia.
